# Patient Record
Sex: Male | Born: 1945
Health system: Southern US, Community
[De-identification: ages and names within clinical notes are randomized; demographics above are authoritative.]

## PROBLEM LIST (undated history)

## (undated) DIAGNOSIS — N184 Chronic kidney disease, stage 4 (severe): Secondary | ICD-10-CM

## (undated) DIAGNOSIS — E119 Type 2 diabetes mellitus without complications: Secondary | ICD-10-CM

## (undated) DIAGNOSIS — M1990 Unspecified osteoarthritis, unspecified site: Secondary | ICD-10-CM

## (undated) DIAGNOSIS — M109 Gout, unspecified: Secondary | ICD-10-CM

## (undated) DIAGNOSIS — I10 Essential (primary) hypertension: Secondary | ICD-10-CM

## (undated) HISTORY — PX: KNEE SURGERY: SHX244

## (undated) HISTORY — DX: Chronic kidney disease, stage 4 (severe): N18.4

## (undated) HISTORY — DX: Gout, unspecified: M10.9

## (undated) HISTORY — DX: Unspecified osteoarthritis, unspecified site: M19.90

## (undated) HISTORY — DX: Type 2 diabetes mellitus without complications: E11.9

## (undated) HISTORY — PX: TONSILLECTOMY: SHX5217

## (undated) HISTORY — DX: Essential (primary) hypertension: I10

---

## 1997-08-30 ENCOUNTER — Ambulatory Visit (HOSPITAL_COMMUNITY): Admission: RE | Admit: 1997-08-30 | Discharge: 1997-08-30 | Payer: Self-pay | Admitting: Family Medicine

## 2014-06-19 DIAGNOSIS — E782 Mixed hyperlipidemia: Secondary | ICD-10-CM | POA: Diagnosis not present

## 2014-06-19 DIAGNOSIS — I10 Essential (primary) hypertension: Secondary | ICD-10-CM | POA: Diagnosis not present

## 2014-06-19 DIAGNOSIS — E1122 Type 2 diabetes mellitus with diabetic chronic kidney disease: Secondary | ICD-10-CM | POA: Diagnosis not present

## 2014-06-19 DIAGNOSIS — E1165 Type 2 diabetes mellitus with hyperglycemia: Secondary | ICD-10-CM | POA: Diagnosis not present

## 2014-06-19 DIAGNOSIS — E6609 Other obesity due to excess calories: Secondary | ICD-10-CM | POA: Diagnosis not present

## 2014-06-26 DIAGNOSIS — E782 Mixed hyperlipidemia: Secondary | ICD-10-CM | POA: Diagnosis not present

## 2014-06-26 DIAGNOSIS — E6609 Other obesity due to excess calories: Secondary | ICD-10-CM | POA: Diagnosis not present

## 2014-06-26 DIAGNOSIS — I10 Essential (primary) hypertension: Secondary | ICD-10-CM | POA: Diagnosis not present

## 2014-06-26 DIAGNOSIS — M1000 Idiopathic gout, unspecified site: Secondary | ICD-10-CM | POA: Diagnosis not present

## 2014-06-26 DIAGNOSIS — E1122 Type 2 diabetes mellitus with diabetic chronic kidney disease: Secondary | ICD-10-CM | POA: Diagnosis not present

## 2014-10-25 DIAGNOSIS — E1122 Type 2 diabetes mellitus with diabetic chronic kidney disease: Secondary | ICD-10-CM | POA: Diagnosis not present

## 2014-10-25 DIAGNOSIS — I10 Essential (primary) hypertension: Secondary | ICD-10-CM | POA: Diagnosis not present

## 2014-10-25 DIAGNOSIS — N183 Chronic kidney disease, stage 3 (moderate): Secondary | ICD-10-CM | POA: Diagnosis not present

## 2014-10-25 DIAGNOSIS — E6609 Other obesity due to excess calories: Secondary | ICD-10-CM | POA: Diagnosis not present

## 2014-10-25 DIAGNOSIS — E782 Mixed hyperlipidemia: Secondary | ICD-10-CM | POA: Diagnosis not present

## 2014-10-31 DIAGNOSIS — E6609 Other obesity due to excess calories: Secondary | ICD-10-CM | POA: Diagnosis not present

## 2014-10-31 DIAGNOSIS — E782 Mixed hyperlipidemia: Secondary | ICD-10-CM | POA: Diagnosis not present

## 2014-10-31 DIAGNOSIS — Z1389 Encounter for screening for other disorder: Secondary | ICD-10-CM | POA: Diagnosis not present

## 2014-10-31 DIAGNOSIS — N183 Chronic kidney disease, stage 3 (moderate): Secondary | ICD-10-CM | POA: Diagnosis not present

## 2014-10-31 DIAGNOSIS — E1122 Type 2 diabetes mellitus with diabetic chronic kidney disease: Secondary | ICD-10-CM | POA: Diagnosis not present

## 2014-10-31 DIAGNOSIS — M1000 Idiopathic gout, unspecified site: Secondary | ICD-10-CM | POA: Diagnosis not present

## 2014-12-13 DIAGNOSIS — Z23 Encounter for immunization: Secondary | ICD-10-CM | POA: Diagnosis not present

## 2015-01-24 DIAGNOSIS — Z23 Encounter for immunization: Secondary | ICD-10-CM | POA: Diagnosis not present

## 2015-02-25 DIAGNOSIS — E1165 Type 2 diabetes mellitus with hyperglycemia: Secondary | ICD-10-CM | POA: Diagnosis not present

## 2015-02-25 DIAGNOSIS — M1000 Idiopathic gout, unspecified site: Secondary | ICD-10-CM | POA: Diagnosis not present

## 2015-02-25 DIAGNOSIS — I10 Essential (primary) hypertension: Secondary | ICD-10-CM | POA: Diagnosis not present

## 2015-02-25 DIAGNOSIS — E782 Mixed hyperlipidemia: Secondary | ICD-10-CM | POA: Diagnosis not present

## 2015-03-04 DIAGNOSIS — Z0001 Encounter for general adult medical examination with abnormal findings: Secondary | ICD-10-CM | POA: Diagnosis not present

## 2015-03-04 DIAGNOSIS — N183 Chronic kidney disease, stage 3 (moderate): Secondary | ICD-10-CM | POA: Diagnosis not present

## 2015-03-04 DIAGNOSIS — E6609 Other obesity due to excess calories: Secondary | ICD-10-CM | POA: Diagnosis not present

## 2015-03-04 DIAGNOSIS — E782 Mixed hyperlipidemia: Secondary | ICD-10-CM | POA: Diagnosis not present

## 2015-03-04 DIAGNOSIS — E1122 Type 2 diabetes mellitus with diabetic chronic kidney disease: Secondary | ICD-10-CM | POA: Diagnosis not present

## 2015-04-16 DIAGNOSIS — Z23 Encounter for immunization: Secondary | ICD-10-CM | POA: Diagnosis not present

## 2015-06-28 DIAGNOSIS — E1122 Type 2 diabetes mellitus with diabetic chronic kidney disease: Secondary | ICD-10-CM | POA: Diagnosis not present

## 2015-06-28 DIAGNOSIS — I10 Essential (primary) hypertension: Secondary | ICD-10-CM | POA: Diagnosis not present

## 2015-06-28 DIAGNOSIS — E782 Mixed hyperlipidemia: Secondary | ICD-10-CM | POA: Diagnosis not present

## 2015-06-28 DIAGNOSIS — M1000 Idiopathic gout, unspecified site: Secondary | ICD-10-CM | POA: Diagnosis not present

## 2015-07-03 DIAGNOSIS — E1122 Type 2 diabetes mellitus with diabetic chronic kidney disease: Secondary | ICD-10-CM | POA: Diagnosis not present

## 2015-07-03 DIAGNOSIS — I10 Essential (primary) hypertension: Secondary | ICD-10-CM | POA: Diagnosis not present

## 2015-07-03 DIAGNOSIS — E782 Mixed hyperlipidemia: Secondary | ICD-10-CM | POA: Diagnosis not present

## 2015-07-03 DIAGNOSIS — N183 Chronic kidney disease, stage 3 (moderate): Secondary | ICD-10-CM | POA: Diagnosis not present

## 2015-11-06 DIAGNOSIS — I10 Essential (primary) hypertension: Secondary | ICD-10-CM | POA: Diagnosis not present

## 2015-11-06 DIAGNOSIS — E782 Mixed hyperlipidemia: Secondary | ICD-10-CM | POA: Diagnosis not present

## 2015-11-06 DIAGNOSIS — E1122 Type 2 diabetes mellitus with diabetic chronic kidney disease: Secondary | ICD-10-CM | POA: Diagnosis not present

## 2015-11-06 DIAGNOSIS — N183 Chronic kidney disease, stage 3 (moderate): Secondary | ICD-10-CM | POA: Diagnosis not present

## 2015-11-11 DIAGNOSIS — E1122 Type 2 diabetes mellitus with diabetic chronic kidney disease: Secondary | ICD-10-CM | POA: Diagnosis not present

## 2015-11-11 DIAGNOSIS — N183 Chronic kidney disease, stage 3 (moderate): Secondary | ICD-10-CM | POA: Diagnosis not present

## 2015-11-11 DIAGNOSIS — E782 Mixed hyperlipidemia: Secondary | ICD-10-CM | POA: Diagnosis not present

## 2015-11-11 DIAGNOSIS — I10 Essential (primary) hypertension: Secondary | ICD-10-CM | POA: Diagnosis not present

## 2016-01-31 DIAGNOSIS — Z23 Encounter for immunization: Secondary | ICD-10-CM | POA: Diagnosis not present

## 2016-03-04 DIAGNOSIS — Z9189 Other specified personal risk factors, not elsewhere classified: Secondary | ICD-10-CM | POA: Diagnosis not present

## 2016-03-04 DIAGNOSIS — E1122 Type 2 diabetes mellitus with diabetic chronic kidney disease: Secondary | ICD-10-CM | POA: Diagnosis not present

## 2016-03-04 DIAGNOSIS — N183 Chronic kidney disease, stage 3 (moderate): Secondary | ICD-10-CM | POA: Diagnosis not present

## 2016-03-04 DIAGNOSIS — E782 Mixed hyperlipidemia: Secondary | ICD-10-CM | POA: Diagnosis not present

## 2016-03-04 DIAGNOSIS — I10 Essential (primary) hypertension: Secondary | ICD-10-CM | POA: Diagnosis not present

## 2016-03-09 DIAGNOSIS — Z1212 Encounter for screening for malignant neoplasm of rectum: Secondary | ICD-10-CM | POA: Diagnosis not present

## 2016-03-09 DIAGNOSIS — Z0001 Encounter for general adult medical examination with abnormal findings: Secondary | ICD-10-CM | POA: Diagnosis not present

## 2016-03-09 DIAGNOSIS — I10 Essential (primary) hypertension: Secondary | ICD-10-CM | POA: Diagnosis not present

## 2016-03-09 DIAGNOSIS — E782 Mixed hyperlipidemia: Secondary | ICD-10-CM | POA: Diagnosis not present

## 2016-03-09 DIAGNOSIS — E1122 Type 2 diabetes mellitus with diabetic chronic kidney disease: Secondary | ICD-10-CM | POA: Diagnosis not present

## 2016-05-14 DIAGNOSIS — Q438 Other specified congenital malformations of intestine: Secondary | ICD-10-CM | POA: Diagnosis not present

## 2016-05-14 DIAGNOSIS — Z8489 Family history of other specified conditions: Secondary | ICD-10-CM | POA: Diagnosis not present

## 2016-05-14 DIAGNOSIS — E785 Hyperlipidemia, unspecified: Secondary | ICD-10-CM | POA: Diagnosis not present

## 2016-05-14 DIAGNOSIS — Z7982 Long term (current) use of aspirin: Secondary | ICD-10-CM | POA: Diagnosis not present

## 2016-05-14 DIAGNOSIS — Z1211 Encounter for screening for malignant neoplasm of colon: Secondary | ICD-10-CM | POA: Diagnosis not present

## 2016-05-14 DIAGNOSIS — Z7984 Long term (current) use of oral hypoglycemic drugs: Secondary | ICD-10-CM | POA: Diagnosis not present

## 2016-05-14 DIAGNOSIS — M1990 Unspecified osteoarthritis, unspecified site: Secondary | ICD-10-CM | POA: Diagnosis not present

## 2016-05-14 DIAGNOSIS — Z800 Family history of malignant neoplasm of digestive organs: Secondary | ICD-10-CM | POA: Diagnosis not present

## 2016-05-14 DIAGNOSIS — Z79899 Other long term (current) drug therapy: Secondary | ICD-10-CM | POA: Diagnosis not present

## 2016-05-14 DIAGNOSIS — I10 Essential (primary) hypertension: Secondary | ICD-10-CM | POA: Diagnosis not present

## 2016-05-14 DIAGNOSIS — E119 Type 2 diabetes mellitus without complications: Secondary | ICD-10-CM | POA: Diagnosis not present

## 2016-05-14 DIAGNOSIS — M109 Gout, unspecified: Secondary | ICD-10-CM | POA: Diagnosis not present

## 2016-07-02 DIAGNOSIS — E782 Mixed hyperlipidemia: Secondary | ICD-10-CM | POA: Diagnosis not present

## 2016-07-02 DIAGNOSIS — E1165 Type 2 diabetes mellitus with hyperglycemia: Secondary | ICD-10-CM | POA: Diagnosis not present

## 2016-07-02 DIAGNOSIS — I10 Essential (primary) hypertension: Secondary | ICD-10-CM | POA: Diagnosis not present

## 2016-07-02 DIAGNOSIS — E1122 Type 2 diabetes mellitus with diabetic chronic kidney disease: Secondary | ICD-10-CM | POA: Diagnosis not present

## 2016-07-02 DIAGNOSIS — N183 Chronic kidney disease, stage 3 (moderate): Secondary | ICD-10-CM | POA: Diagnosis not present

## 2016-07-06 DIAGNOSIS — E1122 Type 2 diabetes mellitus with diabetic chronic kidney disease: Secondary | ICD-10-CM | POA: Diagnosis not present

## 2016-07-06 DIAGNOSIS — Z23 Encounter for immunization: Secondary | ICD-10-CM | POA: Diagnosis not present

## 2016-07-06 DIAGNOSIS — E782 Mixed hyperlipidemia: Secondary | ICD-10-CM | POA: Diagnosis not present

## 2016-07-06 DIAGNOSIS — N183 Chronic kidney disease, stage 3 (moderate): Secondary | ICD-10-CM | POA: Diagnosis not present

## 2016-07-06 DIAGNOSIS — I10 Essential (primary) hypertension: Secondary | ICD-10-CM | POA: Diagnosis not present

## 2016-10-05 DIAGNOSIS — Z23 Encounter for immunization: Secondary | ICD-10-CM | POA: Diagnosis not present

## 2016-10-30 DIAGNOSIS — E1165 Type 2 diabetes mellitus with hyperglycemia: Secondary | ICD-10-CM | POA: Diagnosis not present

## 2016-10-30 DIAGNOSIS — I10 Essential (primary) hypertension: Secondary | ICD-10-CM | POA: Diagnosis not present

## 2016-10-30 DIAGNOSIS — N183 Chronic kidney disease, stage 3 (moderate): Secondary | ICD-10-CM | POA: Diagnosis not present

## 2016-10-30 DIAGNOSIS — E1122 Type 2 diabetes mellitus with diabetic chronic kidney disease: Secondary | ICD-10-CM | POA: Diagnosis not present

## 2016-10-30 DIAGNOSIS — E782 Mixed hyperlipidemia: Secondary | ICD-10-CM | POA: Diagnosis not present

## 2016-11-03 DIAGNOSIS — N183 Chronic kidney disease, stage 3 (moderate): Secondary | ICD-10-CM | POA: Diagnosis not present

## 2016-11-03 DIAGNOSIS — E1122 Type 2 diabetes mellitus with diabetic chronic kidney disease: Secondary | ICD-10-CM | POA: Diagnosis not present

## 2016-11-03 DIAGNOSIS — E782 Mixed hyperlipidemia: Secondary | ICD-10-CM | POA: Diagnosis not present

## 2016-11-03 DIAGNOSIS — I10 Essential (primary) hypertension: Secondary | ICD-10-CM | POA: Diagnosis not present

## 2016-12-17 DIAGNOSIS — Z23 Encounter for immunization: Secondary | ICD-10-CM | POA: Diagnosis not present

## 2017-02-25 DIAGNOSIS — H524 Presbyopia: Secondary | ICD-10-CM | POA: Diagnosis not present

## 2017-02-25 DIAGNOSIS — E11319 Type 2 diabetes mellitus with unspecified diabetic retinopathy without macular edema: Secondary | ICD-10-CM | POA: Diagnosis not present

## 2017-03-05 DIAGNOSIS — I10 Essential (primary) hypertension: Secondary | ICD-10-CM | POA: Diagnosis not present

## 2017-03-05 DIAGNOSIS — D519 Vitamin B12 deficiency anemia, unspecified: Secondary | ICD-10-CM | POA: Diagnosis not present

## 2017-03-05 DIAGNOSIS — Z0001 Encounter for general adult medical examination with abnormal findings: Secondary | ICD-10-CM | POA: Diagnosis not present

## 2017-03-05 DIAGNOSIS — D509 Iron deficiency anemia, unspecified: Secondary | ICD-10-CM | POA: Diagnosis not present

## 2017-03-05 DIAGNOSIS — N183 Chronic kidney disease, stage 3 (moderate): Secondary | ICD-10-CM | POA: Diagnosis not present

## 2017-03-05 DIAGNOSIS — E782 Mixed hyperlipidemia: Secondary | ICD-10-CM | POA: Diagnosis not present

## 2017-03-05 DIAGNOSIS — E1165 Type 2 diabetes mellitus with hyperglycemia: Secondary | ICD-10-CM | POA: Diagnosis not present

## 2017-03-05 DIAGNOSIS — E1122 Type 2 diabetes mellitus with diabetic chronic kidney disease: Secondary | ICD-10-CM | POA: Diagnosis not present

## 2017-03-11 DIAGNOSIS — Z0001 Encounter for general adult medical examination with abnormal findings: Secondary | ICD-10-CM | POA: Diagnosis not present

## 2017-03-11 DIAGNOSIS — E782 Mixed hyperlipidemia: Secondary | ICD-10-CM | POA: Diagnosis not present

## 2017-03-11 DIAGNOSIS — N183 Chronic kidney disease, stage 3 (moderate): Secondary | ICD-10-CM | POA: Diagnosis not present

## 2017-03-11 DIAGNOSIS — E1122 Type 2 diabetes mellitus with diabetic chronic kidney disease: Secondary | ICD-10-CM | POA: Diagnosis not present

## 2017-03-11 DIAGNOSIS — I10 Essential (primary) hypertension: Secondary | ICD-10-CM | POA: Diagnosis not present

## 2017-07-05 DIAGNOSIS — E1122 Type 2 diabetes mellitus with diabetic chronic kidney disease: Secondary | ICD-10-CM | POA: Diagnosis not present

## 2017-07-05 DIAGNOSIS — N183 Chronic kidney disease, stage 3 (moderate): Secondary | ICD-10-CM | POA: Diagnosis not present

## 2017-07-05 DIAGNOSIS — I10 Essential (primary) hypertension: Secondary | ICD-10-CM | POA: Diagnosis not present

## 2017-07-05 DIAGNOSIS — E782 Mixed hyperlipidemia: Secondary | ICD-10-CM | POA: Diagnosis not present

## 2017-07-13 DIAGNOSIS — E119 Type 2 diabetes mellitus without complications: Secondary | ICD-10-CM | POA: Diagnosis not present

## 2017-07-13 DIAGNOSIS — I10 Essential (primary) hypertension: Secondary | ICD-10-CM | POA: Diagnosis not present

## 2017-07-13 DIAGNOSIS — E785 Hyperlipidemia, unspecified: Secondary | ICD-10-CM | POA: Diagnosis not present

## 2017-07-14 DIAGNOSIS — E785 Hyperlipidemia, unspecified: Secondary | ICD-10-CM | POA: Diagnosis not present

## 2017-07-14 DIAGNOSIS — E119 Type 2 diabetes mellitus without complications: Secondary | ICD-10-CM | POA: Diagnosis not present

## 2017-07-14 DIAGNOSIS — I10 Essential (primary) hypertension: Secondary | ICD-10-CM | POA: Diagnosis not present

## 2017-09-21 DIAGNOSIS — M1000 Idiopathic gout, unspecified site: Secondary | ICD-10-CM | POA: Diagnosis not present

## 2017-09-21 DIAGNOSIS — E1165 Type 2 diabetes mellitus with hyperglycemia: Secondary | ICD-10-CM | POA: Diagnosis not present

## 2017-09-21 DIAGNOSIS — I10 Essential (primary) hypertension: Secondary | ICD-10-CM | POA: Diagnosis not present

## 2017-09-21 DIAGNOSIS — D519 Vitamin B12 deficiency anemia, unspecified: Secondary | ICD-10-CM | POA: Diagnosis not present

## 2017-09-21 DIAGNOSIS — E1122 Type 2 diabetes mellitus with diabetic chronic kidney disease: Secondary | ICD-10-CM | POA: Diagnosis not present

## 2017-09-27 DIAGNOSIS — N183 Chronic kidney disease, stage 3 (moderate): Secondary | ICD-10-CM | POA: Diagnosis not present

## 2017-09-27 DIAGNOSIS — I10 Essential (primary) hypertension: Secondary | ICD-10-CM | POA: Diagnosis not present

## 2017-09-27 DIAGNOSIS — E782 Mixed hyperlipidemia: Secondary | ICD-10-CM | POA: Diagnosis not present

## 2017-09-27 DIAGNOSIS — E1122 Type 2 diabetes mellitus with diabetic chronic kidney disease: Secondary | ICD-10-CM | POA: Diagnosis not present

## 2017-09-27 DIAGNOSIS — E538 Deficiency of other specified B group vitamins: Secondary | ICD-10-CM | POA: Diagnosis not present

## 2018-01-27 DIAGNOSIS — Z23 Encounter for immunization: Secondary | ICD-10-CM | POA: Diagnosis not present

## 2018-03-10 DIAGNOSIS — D519 Vitamin B12 deficiency anemia, unspecified: Secondary | ICD-10-CM | POA: Diagnosis not present

## 2018-03-10 DIAGNOSIS — E1165 Type 2 diabetes mellitus with hyperglycemia: Secondary | ICD-10-CM | POA: Diagnosis not present

## 2018-03-10 DIAGNOSIS — E782 Mixed hyperlipidemia: Secondary | ICD-10-CM | POA: Diagnosis not present

## 2018-03-10 DIAGNOSIS — I10 Essential (primary) hypertension: Secondary | ICD-10-CM | POA: Diagnosis not present

## 2018-03-10 DIAGNOSIS — E1122 Type 2 diabetes mellitus with diabetic chronic kidney disease: Secondary | ICD-10-CM | POA: Diagnosis not present

## 2018-03-10 DIAGNOSIS — Z9189 Other specified personal risk factors, not elsewhere classified: Secondary | ICD-10-CM | POA: Diagnosis not present

## 2018-03-14 DIAGNOSIS — Z1212 Encounter for screening for malignant neoplasm of rectum: Secondary | ICD-10-CM | POA: Diagnosis not present

## 2018-03-14 DIAGNOSIS — Z0001 Encounter for general adult medical examination with abnormal findings: Secondary | ICD-10-CM | POA: Diagnosis not present

## 2018-03-14 DIAGNOSIS — I10 Essential (primary) hypertension: Secondary | ICD-10-CM | POA: Diagnosis not present

## 2018-04-21 DIAGNOSIS — I10 Essential (primary) hypertension: Secondary | ICD-10-CM | POA: Diagnosis not present

## 2018-04-21 DIAGNOSIS — E782 Mixed hyperlipidemia: Secondary | ICD-10-CM | POA: Diagnosis not present

## 2018-07-08 DIAGNOSIS — N183 Chronic kidney disease, stage 3 (moderate): Secondary | ICD-10-CM | POA: Diagnosis not present

## 2018-07-08 DIAGNOSIS — Z9189 Other specified personal risk factors, not elsewhere classified: Secondary | ICD-10-CM | POA: Diagnosis not present

## 2018-07-08 DIAGNOSIS — D649 Anemia, unspecified: Secondary | ICD-10-CM | POA: Diagnosis not present

## 2018-07-08 DIAGNOSIS — I10 Essential (primary) hypertension: Secondary | ICD-10-CM | POA: Diagnosis not present

## 2018-07-08 DIAGNOSIS — E782 Mixed hyperlipidemia: Secondary | ICD-10-CM | POA: Diagnosis not present

## 2018-07-08 DIAGNOSIS — E039 Hypothyroidism, unspecified: Secondary | ICD-10-CM | POA: Diagnosis not present

## 2018-07-08 DIAGNOSIS — E1122 Type 2 diabetes mellitus with diabetic chronic kidney disease: Secondary | ICD-10-CM | POA: Diagnosis not present

## 2018-07-12 DIAGNOSIS — I10 Essential (primary) hypertension: Secondary | ICD-10-CM | POA: Diagnosis not present

## 2018-07-12 DIAGNOSIS — E782 Mixed hyperlipidemia: Secondary | ICD-10-CM | POA: Diagnosis not present

## 2018-07-12 DIAGNOSIS — E1122 Type 2 diabetes mellitus with diabetic chronic kidney disease: Secondary | ICD-10-CM | POA: Diagnosis not present

## 2018-07-12 DIAGNOSIS — E538 Deficiency of other specified B group vitamins: Secondary | ICD-10-CM | POA: Diagnosis not present

## 2018-07-12 DIAGNOSIS — N183 Chronic kidney disease, stage 3 (moderate): Secondary | ICD-10-CM | POA: Diagnosis not present

## 2018-10-28 DIAGNOSIS — E782 Mixed hyperlipidemia: Secondary | ICD-10-CM | POA: Diagnosis not present

## 2018-10-28 DIAGNOSIS — E1165 Type 2 diabetes mellitus with hyperglycemia: Secondary | ICD-10-CM | POA: Diagnosis not present

## 2018-10-28 DIAGNOSIS — I10 Essential (primary) hypertension: Secondary | ICD-10-CM | POA: Diagnosis not present

## 2018-10-28 DIAGNOSIS — E1122 Type 2 diabetes mellitus with diabetic chronic kidney disease: Secondary | ICD-10-CM | POA: Diagnosis not present

## 2018-11-02 DIAGNOSIS — I10 Essential (primary) hypertension: Secondary | ICD-10-CM | POA: Diagnosis not present

## 2018-11-02 DIAGNOSIS — E1122 Type 2 diabetes mellitus with diabetic chronic kidney disease: Secondary | ICD-10-CM | POA: Diagnosis not present

## 2018-11-02 DIAGNOSIS — E538 Deficiency of other specified B group vitamins: Secondary | ICD-10-CM | POA: Diagnosis not present

## 2018-11-02 DIAGNOSIS — N183 Chronic kidney disease, stage 3 (moderate): Secondary | ICD-10-CM | POA: Diagnosis not present

## 2018-11-02 DIAGNOSIS — E782 Mixed hyperlipidemia: Secondary | ICD-10-CM | POA: Diagnosis not present

## 2018-11-21 DIAGNOSIS — E782 Mixed hyperlipidemia: Secondary | ICD-10-CM | POA: Diagnosis not present

## 2018-11-21 DIAGNOSIS — I10 Essential (primary) hypertension: Secondary | ICD-10-CM | POA: Diagnosis not present

## 2019-01-18 DIAGNOSIS — Z23 Encounter for immunization: Secondary | ICD-10-CM | POA: Diagnosis not present

## 2019-03-10 DIAGNOSIS — E1165 Type 2 diabetes mellitus with hyperglycemia: Secondary | ICD-10-CM | POA: Diagnosis not present

## 2019-03-10 DIAGNOSIS — E782 Mixed hyperlipidemia: Secondary | ICD-10-CM | POA: Diagnosis not present

## 2019-03-10 DIAGNOSIS — I10 Essential (primary) hypertension: Secondary | ICD-10-CM | POA: Diagnosis not present

## 2019-03-10 DIAGNOSIS — D649 Anemia, unspecified: Secondary | ICD-10-CM | POA: Diagnosis not present

## 2019-03-10 DIAGNOSIS — E1122 Type 2 diabetes mellitus with diabetic chronic kidney disease: Secondary | ICD-10-CM | POA: Diagnosis not present

## 2019-03-15 DIAGNOSIS — Z0001 Encounter for general adult medical examination with abnormal findings: Secondary | ICD-10-CM | POA: Diagnosis not present

## 2019-03-15 DIAGNOSIS — E1122 Type 2 diabetes mellitus with diabetic chronic kidney disease: Secondary | ICD-10-CM | POA: Diagnosis not present

## 2019-03-15 DIAGNOSIS — Z1212 Encounter for screening for malignant neoplasm of rectum: Secondary | ICD-10-CM | POA: Diagnosis not present

## 2019-03-15 DIAGNOSIS — D519 Vitamin B12 deficiency anemia, unspecified: Secondary | ICD-10-CM | POA: Diagnosis not present

## 2019-03-15 DIAGNOSIS — I10 Essential (primary) hypertension: Secondary | ICD-10-CM | POA: Diagnosis not present

## 2019-03-15 DIAGNOSIS — E538 Deficiency of other specified B group vitamins: Secondary | ICD-10-CM | POA: Diagnosis not present

## 2019-04-21 DIAGNOSIS — E1122 Type 2 diabetes mellitus with diabetic chronic kidney disease: Secondary | ICD-10-CM | POA: Diagnosis not present

## 2019-04-21 DIAGNOSIS — E1165 Type 2 diabetes mellitus with hyperglycemia: Secondary | ICD-10-CM | POA: Diagnosis not present

## 2019-04-21 DIAGNOSIS — E7849 Other hyperlipidemia: Secondary | ICD-10-CM | POA: Diagnosis not present

## 2019-06-20 DIAGNOSIS — E7849 Other hyperlipidemia: Secondary | ICD-10-CM | POA: Diagnosis not present

## 2019-06-20 DIAGNOSIS — I10 Essential (primary) hypertension: Secondary | ICD-10-CM | POA: Diagnosis not present

## 2019-06-21 DIAGNOSIS — E7849 Other hyperlipidemia: Secondary | ICD-10-CM | POA: Diagnosis not present

## 2019-06-21 DIAGNOSIS — I10 Essential (primary) hypertension: Secondary | ICD-10-CM | POA: Diagnosis not present

## 2019-07-07 DIAGNOSIS — Z9189 Other specified personal risk factors, not elsewhere classified: Secondary | ICD-10-CM | POA: Diagnosis not present

## 2019-07-07 DIAGNOSIS — E782 Mixed hyperlipidemia: Secondary | ICD-10-CM | POA: Diagnosis not present

## 2019-07-07 DIAGNOSIS — I10 Essential (primary) hypertension: Secondary | ICD-10-CM | POA: Diagnosis not present

## 2019-07-07 DIAGNOSIS — R5382 Chronic fatigue, unspecified: Secondary | ICD-10-CM | POA: Diagnosis not present

## 2019-07-07 DIAGNOSIS — N183 Chronic kidney disease, stage 3 unspecified: Secondary | ICD-10-CM | POA: Diagnosis not present

## 2019-07-07 DIAGNOSIS — E1122 Type 2 diabetes mellitus with diabetic chronic kidney disease: Secondary | ICD-10-CM | POA: Diagnosis not present

## 2019-07-12 DIAGNOSIS — I10 Essential (primary) hypertension: Secondary | ICD-10-CM | POA: Diagnosis not present

## 2019-07-12 DIAGNOSIS — D519 Vitamin B12 deficiency anemia, unspecified: Secondary | ICD-10-CM | POA: Diagnosis not present

## 2019-07-12 DIAGNOSIS — E538 Deficiency of other specified B group vitamins: Secondary | ICD-10-CM | POA: Diagnosis not present

## 2019-07-12 DIAGNOSIS — E1122 Type 2 diabetes mellitus with diabetic chronic kidney disease: Secondary | ICD-10-CM | POA: Diagnosis not present

## 2019-07-12 DIAGNOSIS — E782 Mixed hyperlipidemia: Secondary | ICD-10-CM | POA: Diagnosis not present

## 2019-07-20 DIAGNOSIS — E1122 Type 2 diabetes mellitus with diabetic chronic kidney disease: Secondary | ICD-10-CM | POA: Diagnosis not present

## 2019-07-20 DIAGNOSIS — I10 Essential (primary) hypertension: Secondary | ICD-10-CM | POA: Diagnosis not present

## 2019-07-20 DIAGNOSIS — E1165 Type 2 diabetes mellitus with hyperglycemia: Secondary | ICD-10-CM | POA: Diagnosis not present

## 2019-07-21 DIAGNOSIS — E1165 Type 2 diabetes mellitus with hyperglycemia: Secondary | ICD-10-CM | POA: Diagnosis not present

## 2019-07-21 DIAGNOSIS — E1122 Type 2 diabetes mellitus with diabetic chronic kidney disease: Secondary | ICD-10-CM | POA: Diagnosis not present

## 2019-07-21 DIAGNOSIS — I10 Essential (primary) hypertension: Secondary | ICD-10-CM | POA: Diagnosis not present

## 2019-08-21 DIAGNOSIS — I129 Hypertensive chronic kidney disease with stage 1 through stage 4 chronic kidney disease, or unspecified chronic kidney disease: Secondary | ICD-10-CM | POA: Diagnosis not present

## 2019-08-21 DIAGNOSIS — E1122 Type 2 diabetes mellitus with diabetic chronic kidney disease: Secondary | ICD-10-CM | POA: Diagnosis not present

## 2019-08-21 DIAGNOSIS — N183 Chronic kidney disease, stage 3 unspecified: Secondary | ICD-10-CM | POA: Diagnosis not present

## 2019-08-21 DIAGNOSIS — E7849 Other hyperlipidemia: Secondary | ICD-10-CM | POA: Diagnosis not present

## 2019-09-20 DIAGNOSIS — N183 Chronic kidney disease, stage 3 unspecified: Secondary | ICD-10-CM | POA: Diagnosis not present

## 2019-09-20 DIAGNOSIS — E7849 Other hyperlipidemia: Secondary | ICD-10-CM | POA: Diagnosis not present

## 2019-09-20 DIAGNOSIS — I129 Hypertensive chronic kidney disease with stage 1 through stage 4 chronic kidney disease, or unspecified chronic kidney disease: Secondary | ICD-10-CM | POA: Diagnosis not present

## 2019-09-20 DIAGNOSIS — E1122 Type 2 diabetes mellitus with diabetic chronic kidney disease: Secondary | ICD-10-CM | POA: Diagnosis not present

## 2019-10-20 DIAGNOSIS — I129 Hypertensive chronic kidney disease with stage 1 through stage 4 chronic kidney disease, or unspecified chronic kidney disease: Secondary | ICD-10-CM | POA: Diagnosis not present

## 2019-10-20 DIAGNOSIS — N183 Chronic kidney disease, stage 3 unspecified: Secondary | ICD-10-CM | POA: Diagnosis not present

## 2019-10-20 DIAGNOSIS — E1122 Type 2 diabetes mellitus with diabetic chronic kidney disease: Secondary | ICD-10-CM | POA: Diagnosis not present

## 2019-10-20 DIAGNOSIS — E7849 Other hyperlipidemia: Secondary | ICD-10-CM | POA: Diagnosis not present

## 2019-11-10 DIAGNOSIS — I10 Essential (primary) hypertension: Secondary | ICD-10-CM | POA: Diagnosis not present

## 2019-11-10 DIAGNOSIS — E782 Mixed hyperlipidemia: Secondary | ICD-10-CM | POA: Diagnosis not present

## 2019-11-10 DIAGNOSIS — Z9189 Other specified personal risk factors, not elsewhere classified: Secondary | ICD-10-CM | POA: Diagnosis not present

## 2019-11-10 DIAGNOSIS — E1122 Type 2 diabetes mellitus with diabetic chronic kidney disease: Secondary | ICD-10-CM | POA: Diagnosis not present

## 2019-11-10 DIAGNOSIS — E1165 Type 2 diabetes mellitus with hyperglycemia: Secondary | ICD-10-CM | POA: Diagnosis not present

## 2019-11-10 DIAGNOSIS — N183 Chronic kidney disease, stage 3 unspecified: Secondary | ICD-10-CM | POA: Diagnosis not present

## 2019-11-13 DIAGNOSIS — E538 Deficiency of other specified B group vitamins: Secondary | ICD-10-CM | POA: Diagnosis not present

## 2019-11-13 DIAGNOSIS — I10 Essential (primary) hypertension: Secondary | ICD-10-CM | POA: Diagnosis not present

## 2019-11-13 DIAGNOSIS — D519 Vitamin B12 deficiency anemia, unspecified: Secondary | ICD-10-CM | POA: Diagnosis not present

## 2019-11-13 DIAGNOSIS — E782 Mixed hyperlipidemia: Secondary | ICD-10-CM | POA: Diagnosis not present

## 2019-11-13 DIAGNOSIS — E1122 Type 2 diabetes mellitus with diabetic chronic kidney disease: Secondary | ICD-10-CM | POA: Diagnosis not present

## 2019-12-19 DIAGNOSIS — D649 Anemia, unspecified: Secondary | ICD-10-CM | POA: Diagnosis not present

## 2019-12-19 DIAGNOSIS — I10 Essential (primary) hypertension: Secondary | ICD-10-CM | POA: Diagnosis not present

## 2019-12-19 DIAGNOSIS — Z1321 Encounter for screening for nutritional disorder: Secondary | ICD-10-CM | POA: Diagnosis not present

## 2019-12-19 DIAGNOSIS — D529 Folate deficiency anemia, unspecified: Secondary | ICD-10-CM | POA: Diagnosis not present

## 2019-12-19 DIAGNOSIS — E559 Vitamin D deficiency, unspecified: Secondary | ICD-10-CM | POA: Diagnosis not present

## 2019-12-19 DIAGNOSIS — E1122 Type 2 diabetes mellitus with diabetic chronic kidney disease: Secondary | ICD-10-CM | POA: Diagnosis not present

## 2019-12-21 DIAGNOSIS — E1122 Type 2 diabetes mellitus with diabetic chronic kidney disease: Secondary | ICD-10-CM | POA: Diagnosis not present

## 2019-12-21 DIAGNOSIS — I129 Hypertensive chronic kidney disease with stage 1 through stage 4 chronic kidney disease, or unspecified chronic kidney disease: Secondary | ICD-10-CM | POA: Diagnosis not present

## 2019-12-21 DIAGNOSIS — N183 Chronic kidney disease, stage 3 unspecified: Secondary | ICD-10-CM | POA: Diagnosis not present

## 2019-12-21 DIAGNOSIS — E7849 Other hyperlipidemia: Secondary | ICD-10-CM | POA: Diagnosis not present

## 2020-01-17 DIAGNOSIS — Z23 Encounter for immunization: Secondary | ICD-10-CM | POA: Diagnosis not present

## 2020-01-20 DIAGNOSIS — I129 Hypertensive chronic kidney disease with stage 1 through stage 4 chronic kidney disease, or unspecified chronic kidney disease: Secondary | ICD-10-CM | POA: Diagnosis not present

## 2020-01-20 DIAGNOSIS — E7849 Other hyperlipidemia: Secondary | ICD-10-CM | POA: Diagnosis not present

## 2020-01-20 DIAGNOSIS — E1122 Type 2 diabetes mellitus with diabetic chronic kidney disease: Secondary | ICD-10-CM | POA: Diagnosis not present

## 2020-01-20 DIAGNOSIS — N183 Chronic kidney disease, stage 3 unspecified: Secondary | ICD-10-CM | POA: Diagnosis not present

## 2020-01-24 DIAGNOSIS — Z23 Encounter for immunization: Secondary | ICD-10-CM | POA: Diagnosis not present

## 2020-02-20 DIAGNOSIS — I129 Hypertensive chronic kidney disease with stage 1 through stage 4 chronic kidney disease, or unspecified chronic kidney disease: Secondary | ICD-10-CM | POA: Diagnosis not present

## 2020-02-20 DIAGNOSIS — N183 Chronic kidney disease, stage 3 unspecified: Secondary | ICD-10-CM | POA: Diagnosis not present

## 2020-02-20 DIAGNOSIS — E1122 Type 2 diabetes mellitus with diabetic chronic kidney disease: Secondary | ICD-10-CM | POA: Diagnosis not present

## 2020-03-08 DIAGNOSIS — D529 Folate deficiency anemia, unspecified: Secondary | ICD-10-CM | POA: Diagnosis not present

## 2020-03-08 DIAGNOSIS — Z1329 Encounter for screening for other suspected endocrine disorder: Secondary | ICD-10-CM | POA: Diagnosis not present

## 2020-03-08 DIAGNOSIS — D519 Vitamin B12 deficiency anemia, unspecified: Secondary | ICD-10-CM | POA: Diagnosis not present

## 2020-03-08 DIAGNOSIS — E1165 Type 2 diabetes mellitus with hyperglycemia: Secondary | ICD-10-CM | POA: Diagnosis not present

## 2020-03-08 DIAGNOSIS — D649 Anemia, unspecified: Secondary | ICD-10-CM | POA: Diagnosis not present

## 2020-03-08 DIAGNOSIS — E1122 Type 2 diabetes mellitus with diabetic chronic kidney disease: Secondary | ICD-10-CM | POA: Diagnosis not present

## 2020-03-11 DIAGNOSIS — D519 Vitamin B12 deficiency anemia, unspecified: Secondary | ICD-10-CM | POA: Diagnosis not present

## 2020-03-11 DIAGNOSIS — I10 Essential (primary) hypertension: Secondary | ICD-10-CM | POA: Diagnosis not present

## 2020-03-11 DIAGNOSIS — Z0001 Encounter for general adult medical examination with abnormal findings: Secondary | ICD-10-CM | POA: Diagnosis not present

## 2020-03-11 DIAGNOSIS — E782 Mixed hyperlipidemia: Secondary | ICD-10-CM | POA: Diagnosis not present

## 2020-03-11 DIAGNOSIS — E1122 Type 2 diabetes mellitus with diabetic chronic kidney disease: Secondary | ICD-10-CM | POA: Diagnosis not present

## 2020-03-22 DIAGNOSIS — E1122 Type 2 diabetes mellitus with diabetic chronic kidney disease: Secondary | ICD-10-CM | POA: Diagnosis not present

## 2020-03-22 DIAGNOSIS — N183 Chronic kidney disease, stage 3 unspecified: Secondary | ICD-10-CM | POA: Diagnosis not present

## 2020-03-22 DIAGNOSIS — E7849 Other hyperlipidemia: Secondary | ICD-10-CM | POA: Diagnosis not present

## 2020-03-22 DIAGNOSIS — I129 Hypertensive chronic kidney disease with stage 1 through stage 4 chronic kidney disease, or unspecified chronic kidney disease: Secondary | ICD-10-CM | POA: Diagnosis not present

## 2020-04-20 DIAGNOSIS — N183 Chronic kidney disease, stage 3 unspecified: Secondary | ICD-10-CM | POA: Diagnosis not present

## 2020-04-20 DIAGNOSIS — I129 Hypertensive chronic kidney disease with stage 1 through stage 4 chronic kidney disease, or unspecified chronic kidney disease: Secondary | ICD-10-CM | POA: Diagnosis not present

## 2020-04-20 DIAGNOSIS — E1122 Type 2 diabetes mellitus with diabetic chronic kidney disease: Secondary | ICD-10-CM | POA: Diagnosis not present

## 2020-04-20 DIAGNOSIS — E7849 Other hyperlipidemia: Secondary | ICD-10-CM | POA: Diagnosis not present

## 2020-05-20 DIAGNOSIS — I129 Hypertensive chronic kidney disease with stage 1 through stage 4 chronic kidney disease, or unspecified chronic kidney disease: Secondary | ICD-10-CM | POA: Diagnosis not present

## 2020-05-20 DIAGNOSIS — E7849 Other hyperlipidemia: Secondary | ICD-10-CM | POA: Diagnosis not present

## 2020-05-20 DIAGNOSIS — E1122 Type 2 diabetes mellitus with diabetic chronic kidney disease: Secondary | ICD-10-CM | POA: Diagnosis not present

## 2020-05-20 DIAGNOSIS — N183 Chronic kidney disease, stage 3 unspecified: Secondary | ICD-10-CM | POA: Diagnosis not present

## 2020-06-19 DIAGNOSIS — N183 Chronic kidney disease, stage 3 unspecified: Secondary | ICD-10-CM | POA: Diagnosis not present

## 2020-06-19 DIAGNOSIS — E7849 Other hyperlipidemia: Secondary | ICD-10-CM | POA: Diagnosis not present

## 2020-06-19 DIAGNOSIS — E1122 Type 2 diabetes mellitus with diabetic chronic kidney disease: Secondary | ICD-10-CM | POA: Diagnosis not present

## 2020-06-19 DIAGNOSIS — I129 Hypertensive chronic kidney disease with stage 1 through stage 4 chronic kidney disease, or unspecified chronic kidney disease: Secondary | ICD-10-CM | POA: Diagnosis not present

## 2020-06-20 DIAGNOSIS — H35033 Hypertensive retinopathy, bilateral: Secondary | ICD-10-CM | POA: Diagnosis not present

## 2020-06-20 DIAGNOSIS — H524 Presbyopia: Secondary | ICD-10-CM | POA: Diagnosis not present

## 2020-07-11 DIAGNOSIS — E559 Vitamin D deficiency, unspecified: Secondary | ICD-10-CM | POA: Diagnosis not present

## 2020-07-11 DIAGNOSIS — E1122 Type 2 diabetes mellitus with diabetic chronic kidney disease: Secondary | ICD-10-CM | POA: Diagnosis not present

## 2020-07-11 DIAGNOSIS — R809 Proteinuria, unspecified: Secondary | ICD-10-CM | POA: Diagnosis not present

## 2020-07-11 DIAGNOSIS — I10 Essential (primary) hypertension: Secondary | ICD-10-CM | POA: Diagnosis not present

## 2020-07-11 DIAGNOSIS — E782 Mixed hyperlipidemia: Secondary | ICD-10-CM | POA: Diagnosis not present

## 2020-07-11 DIAGNOSIS — E538 Deficiency of other specified B group vitamins: Secondary | ICD-10-CM | POA: Diagnosis not present

## 2020-07-11 DIAGNOSIS — D519 Vitamin B12 deficiency anemia, unspecified: Secondary | ICD-10-CM | POA: Diagnosis not present

## 2020-07-11 DIAGNOSIS — E7849 Other hyperlipidemia: Secondary | ICD-10-CM | POA: Diagnosis not present

## 2020-07-11 DIAGNOSIS — Z23 Encounter for immunization: Secondary | ICD-10-CM | POA: Diagnosis not present

## 2020-07-12 DIAGNOSIS — E7849 Other hyperlipidemia: Secondary | ICD-10-CM | POA: Diagnosis not present

## 2020-07-12 DIAGNOSIS — I10 Essential (primary) hypertension: Secondary | ICD-10-CM | POA: Diagnosis not present

## 2020-07-12 DIAGNOSIS — N183 Chronic kidney disease, stage 3 unspecified: Secondary | ICD-10-CM | POA: Diagnosis not present

## 2020-07-19 DIAGNOSIS — E559 Vitamin D deficiency, unspecified: Secondary | ICD-10-CM | POA: Diagnosis not present

## 2020-07-19 DIAGNOSIS — E782 Mixed hyperlipidemia: Secondary | ICD-10-CM | POA: Diagnosis not present

## 2020-07-19 DIAGNOSIS — I10 Essential (primary) hypertension: Secondary | ICD-10-CM | POA: Diagnosis not present

## 2020-07-19 DIAGNOSIS — N189 Chronic kidney disease, unspecified: Secondary | ICD-10-CM | POA: Diagnosis not present

## 2020-07-19 DIAGNOSIS — N183 Chronic kidney disease, stage 3 unspecified: Secondary | ICD-10-CM | POA: Diagnosis not present

## 2020-07-20 DIAGNOSIS — E1122 Type 2 diabetes mellitus with diabetic chronic kidney disease: Secondary | ICD-10-CM | POA: Diagnosis not present

## 2020-07-20 DIAGNOSIS — N183 Chronic kidney disease, stage 3 unspecified: Secondary | ICD-10-CM | POA: Diagnosis not present

## 2020-07-20 DIAGNOSIS — I129 Hypertensive chronic kidney disease with stage 1 through stage 4 chronic kidney disease, or unspecified chronic kidney disease: Secondary | ICD-10-CM | POA: Diagnosis not present

## 2020-07-20 DIAGNOSIS — E7849 Other hyperlipidemia: Secondary | ICD-10-CM | POA: Diagnosis not present

## 2020-07-29 DIAGNOSIS — N2889 Other specified disorders of kidney and ureter: Secondary | ICD-10-CM | POA: Diagnosis not present

## 2020-07-29 DIAGNOSIS — N189 Chronic kidney disease, unspecified: Secondary | ICD-10-CM | POA: Diagnosis not present

## 2020-08-19 DIAGNOSIS — N183 Chronic kidney disease, stage 3 unspecified: Secondary | ICD-10-CM | POA: Diagnosis not present

## 2020-08-19 DIAGNOSIS — I129 Hypertensive chronic kidney disease with stage 1 through stage 4 chronic kidney disease, or unspecified chronic kidney disease: Secondary | ICD-10-CM | POA: Diagnosis not present

## 2020-08-19 DIAGNOSIS — E7849 Other hyperlipidemia: Secondary | ICD-10-CM | POA: Diagnosis not present

## 2020-08-19 DIAGNOSIS — E1122 Type 2 diabetes mellitus with diabetic chronic kidney disease: Secondary | ICD-10-CM | POA: Diagnosis not present

## 2020-09-19 DIAGNOSIS — E7849 Other hyperlipidemia: Secondary | ICD-10-CM | POA: Diagnosis not present

## 2020-09-19 DIAGNOSIS — N183 Chronic kidney disease, stage 3 unspecified: Secondary | ICD-10-CM | POA: Diagnosis not present

## 2020-09-19 DIAGNOSIS — I129 Hypertensive chronic kidney disease with stage 1 through stage 4 chronic kidney disease, or unspecified chronic kidney disease: Secondary | ICD-10-CM | POA: Diagnosis not present

## 2020-09-19 DIAGNOSIS — E1122 Type 2 diabetes mellitus with diabetic chronic kidney disease: Secondary | ICD-10-CM | POA: Diagnosis not present

## 2020-10-20 DIAGNOSIS — E1122 Type 2 diabetes mellitus with diabetic chronic kidney disease: Secondary | ICD-10-CM | POA: Diagnosis not present

## 2020-10-20 DIAGNOSIS — I129 Hypertensive chronic kidney disease with stage 1 through stage 4 chronic kidney disease, or unspecified chronic kidney disease: Secondary | ICD-10-CM | POA: Diagnosis not present

## 2020-10-20 DIAGNOSIS — N183 Chronic kidney disease, stage 3 unspecified: Secondary | ICD-10-CM | POA: Diagnosis not present

## 2020-10-20 DIAGNOSIS — E7849 Other hyperlipidemia: Secondary | ICD-10-CM | POA: Diagnosis not present

## 2020-10-29 DIAGNOSIS — R059 Cough, unspecified: Secondary | ICD-10-CM | POA: Diagnosis not present

## 2020-11-11 DIAGNOSIS — E782 Mixed hyperlipidemia: Secondary | ICD-10-CM | POA: Diagnosis not present

## 2020-11-11 DIAGNOSIS — E1122 Type 2 diabetes mellitus with diabetic chronic kidney disease: Secondary | ICD-10-CM | POA: Diagnosis not present

## 2020-11-11 DIAGNOSIS — E7849 Other hyperlipidemia: Secondary | ICD-10-CM | POA: Diagnosis not present

## 2020-11-11 DIAGNOSIS — I10 Essential (primary) hypertension: Secondary | ICD-10-CM | POA: Diagnosis not present

## 2020-11-11 DIAGNOSIS — N184 Chronic kidney disease, stage 4 (severe): Secondary | ICD-10-CM | POA: Diagnosis not present

## 2020-11-13 DIAGNOSIS — E7849 Other hyperlipidemia: Secondary | ICD-10-CM | POA: Diagnosis not present

## 2020-11-13 DIAGNOSIS — E1122 Type 2 diabetes mellitus with diabetic chronic kidney disease: Secondary | ICD-10-CM | POA: Diagnosis not present

## 2020-11-13 DIAGNOSIS — D519 Vitamin B12 deficiency anemia, unspecified: Secondary | ICD-10-CM | POA: Diagnosis not present

## 2020-11-13 DIAGNOSIS — E559 Vitamin D deficiency, unspecified: Secondary | ICD-10-CM | POA: Diagnosis not present

## 2020-11-13 DIAGNOSIS — E538 Deficiency of other specified B group vitamins: Secondary | ICD-10-CM | POA: Diagnosis not present

## 2020-11-13 DIAGNOSIS — R809 Proteinuria, unspecified: Secondary | ICD-10-CM | POA: Diagnosis not present

## 2020-11-13 DIAGNOSIS — I10 Essential (primary) hypertension: Secondary | ICD-10-CM | POA: Diagnosis not present

## 2020-11-20 DIAGNOSIS — E7849 Other hyperlipidemia: Secondary | ICD-10-CM | POA: Diagnosis not present

## 2020-11-20 DIAGNOSIS — E1122 Type 2 diabetes mellitus with diabetic chronic kidney disease: Secondary | ICD-10-CM | POA: Diagnosis not present

## 2020-11-20 DIAGNOSIS — N183 Chronic kidney disease, stage 3 unspecified: Secondary | ICD-10-CM | POA: Diagnosis not present

## 2020-11-20 DIAGNOSIS — I129 Hypertensive chronic kidney disease with stage 1 through stage 4 chronic kidney disease, or unspecified chronic kidney disease: Secondary | ICD-10-CM | POA: Diagnosis not present

## 2020-11-21 DIAGNOSIS — R0609 Other forms of dyspnea: Secondary | ICD-10-CM | POA: Diagnosis not present

## 2020-11-21 DIAGNOSIS — J069 Acute upper respiratory infection, unspecified: Secondary | ICD-10-CM | POA: Diagnosis not present

## 2020-11-22 DIAGNOSIS — Z23 Encounter for immunization: Secondary | ICD-10-CM | POA: Diagnosis not present

## 2020-11-22 DIAGNOSIS — R609 Edema, unspecified: Secondary | ICD-10-CM | POA: Diagnosis not present

## 2020-11-22 DIAGNOSIS — N184 Chronic kidney disease, stage 4 (severe): Secondary | ICD-10-CM | POA: Diagnosis not present

## 2020-12-04 DIAGNOSIS — N183 Chronic kidney disease, stage 3 unspecified: Secondary | ICD-10-CM | POA: Diagnosis not present

## 2020-12-13 ENCOUNTER — Other Ambulatory Visit (HOSPITAL_BASED_OUTPATIENT_CLINIC_OR_DEPARTMENT_OTHER): Payer: Self-pay | Admitting: Nephrology

## 2020-12-13 ENCOUNTER — Other Ambulatory Visit (HOSPITAL_COMMUNITY): Payer: Self-pay | Admitting: Nephrology

## 2020-12-13 DIAGNOSIS — I129 Hypertensive chronic kidney disease with stage 1 through stage 4 chronic kidney disease, or unspecified chronic kidney disease: Secondary | ICD-10-CM

## 2020-12-13 DIAGNOSIS — Z5181 Encounter for therapeutic drug level monitoring: Secondary | ICD-10-CM

## 2020-12-13 DIAGNOSIS — N170 Acute kidney failure with tubular necrosis: Secondary | ICD-10-CM | POA: Diagnosis not present

## 2020-12-13 DIAGNOSIS — E875 Hyperkalemia: Secondary | ICD-10-CM | POA: Diagnosis not present

## 2020-12-13 DIAGNOSIS — E1122 Type 2 diabetes mellitus with diabetic chronic kidney disease: Secondary | ICD-10-CM

## 2020-12-13 DIAGNOSIS — N189 Chronic kidney disease, unspecified: Secondary | ICD-10-CM | POA: Diagnosis not present

## 2020-12-17 DIAGNOSIS — E1122 Type 2 diabetes mellitus with diabetic chronic kidney disease: Secondary | ICD-10-CM | POA: Diagnosis not present

## 2020-12-17 DIAGNOSIS — N189 Chronic kidney disease, unspecified: Secondary | ICD-10-CM | POA: Diagnosis not present

## 2020-12-17 DIAGNOSIS — E875 Hyperkalemia: Secondary | ICD-10-CM | POA: Diagnosis not present

## 2020-12-17 DIAGNOSIS — I129 Hypertensive chronic kidney disease with stage 1 through stage 4 chronic kidney disease, or unspecified chronic kidney disease: Secondary | ICD-10-CM | POA: Diagnosis not present

## 2020-12-17 DIAGNOSIS — N170 Acute kidney failure with tubular necrosis: Secondary | ICD-10-CM | POA: Diagnosis not present

## 2020-12-20 DIAGNOSIS — E7849 Other hyperlipidemia: Secondary | ICD-10-CM | POA: Diagnosis not present

## 2020-12-20 DIAGNOSIS — E1122 Type 2 diabetes mellitus with diabetic chronic kidney disease: Secondary | ICD-10-CM | POA: Diagnosis not present

## 2020-12-20 DIAGNOSIS — I129 Hypertensive chronic kidney disease with stage 1 through stage 4 chronic kidney disease, or unspecified chronic kidney disease: Secondary | ICD-10-CM | POA: Diagnosis not present

## 2020-12-20 DIAGNOSIS — N183 Chronic kidney disease, stage 3 unspecified: Secondary | ICD-10-CM | POA: Diagnosis not present

## 2020-12-25 ENCOUNTER — Other Ambulatory Visit: Payer: Self-pay

## 2020-12-25 ENCOUNTER — Ambulatory Visit (HOSPITAL_COMMUNITY)
Admission: RE | Admit: 2020-12-25 | Discharge: 2020-12-25 | Disposition: A | Discharge status: Home / Self Care | Payer: Medicare Other | Source: Ambulatory Visit | Attending: Nephrology | Admitting: Nephrology

## 2020-12-25 DIAGNOSIS — E1122 Type 2 diabetes mellitus with diabetic chronic kidney disease: Secondary | ICD-10-CM | POA: Diagnosis not present

## 2020-12-25 DIAGNOSIS — N170 Acute kidney failure with tubular necrosis: Secondary | ICD-10-CM | POA: Insufficient documentation

## 2020-12-25 DIAGNOSIS — E875 Hyperkalemia: Secondary | ICD-10-CM | POA: Diagnosis not present

## 2020-12-25 DIAGNOSIS — Z5181 Encounter for therapeutic drug level monitoring: Secondary | ICD-10-CM | POA: Diagnosis not present

## 2020-12-25 DIAGNOSIS — I129 Hypertensive chronic kidney disease with stage 1 through stage 4 chronic kidney disease, or unspecified chronic kidney disease: Secondary | ICD-10-CM | POA: Insufficient documentation

## 2020-12-25 DIAGNOSIS — N189 Chronic kidney disease, unspecified: Secondary | ICD-10-CM | POA: Diagnosis not present

## 2021-01-16 DIAGNOSIS — E8722 Chronic metabolic acidosis: Secondary | ICD-10-CM | POA: Diagnosis not present

## 2021-01-16 DIAGNOSIS — E1122 Type 2 diabetes mellitus with diabetic chronic kidney disease: Secondary | ICD-10-CM | POA: Diagnosis not present

## 2021-01-16 DIAGNOSIS — I129 Hypertensive chronic kidney disease with stage 1 through stage 4 chronic kidney disease, or unspecified chronic kidney disease: Secondary | ICD-10-CM | POA: Diagnosis not present

## 2021-01-16 DIAGNOSIS — Z5181 Encounter for therapeutic drug level monitoring: Secondary | ICD-10-CM | POA: Diagnosis not present

## 2021-01-16 DIAGNOSIS — E875 Hyperkalemia: Secondary | ICD-10-CM | POA: Diagnosis not present

## 2021-01-16 DIAGNOSIS — R9341 Abnormal radiologic findings on diagnostic imaging of renal pelvis, ureter, or bladder: Secondary | ICD-10-CM | POA: Diagnosis not present

## 2021-01-16 DIAGNOSIS — N189 Chronic kidney disease, unspecified: Secondary | ICD-10-CM | POA: Diagnosis not present

## 2021-01-16 DIAGNOSIS — R809 Proteinuria, unspecified: Secondary | ICD-10-CM | POA: Diagnosis not present

## 2021-01-16 DIAGNOSIS — E1129 Type 2 diabetes mellitus with other diabetic kidney complication: Secondary | ICD-10-CM | POA: Diagnosis not present

## 2021-01-16 DIAGNOSIS — E211 Secondary hyperparathyroidism, not elsewhere classified: Secondary | ICD-10-CM | POA: Diagnosis not present

## 2021-03-10 DIAGNOSIS — R809 Proteinuria, unspecified: Secondary | ICD-10-CM | POA: Diagnosis not present

## 2021-03-10 DIAGNOSIS — N189 Chronic kidney disease, unspecified: Secondary | ICD-10-CM | POA: Diagnosis not present

## 2021-03-10 DIAGNOSIS — E211 Secondary hyperparathyroidism, not elsewhere classified: Secondary | ICD-10-CM | POA: Diagnosis not present

## 2021-03-10 DIAGNOSIS — E1129 Type 2 diabetes mellitus with other diabetic kidney complication: Secondary | ICD-10-CM | POA: Diagnosis not present

## 2021-03-10 DIAGNOSIS — E1122 Type 2 diabetes mellitus with diabetic chronic kidney disease: Secondary | ICD-10-CM | POA: Diagnosis not present

## 2021-03-18 DIAGNOSIS — D649 Anemia, unspecified: Secondary | ICD-10-CM | POA: Diagnosis not present

## 2021-03-18 DIAGNOSIS — R5382 Chronic fatigue, unspecified: Secondary | ICD-10-CM | POA: Diagnosis not present

## 2021-03-18 DIAGNOSIS — E7849 Other hyperlipidemia: Secondary | ICD-10-CM | POA: Diagnosis not present

## 2021-03-18 DIAGNOSIS — Z1329 Encounter for screening for other suspected endocrine disorder: Secondary | ICD-10-CM | POA: Diagnosis not present

## 2021-03-18 DIAGNOSIS — N183 Chronic kidney disease, stage 3 unspecified: Secondary | ICD-10-CM | POA: Diagnosis not present

## 2021-03-18 DIAGNOSIS — R7309 Other abnormal glucose: Secondary | ICD-10-CM | POA: Diagnosis not present

## 2021-03-18 DIAGNOSIS — E1165 Type 2 diabetes mellitus with hyperglycemia: Secondary | ICD-10-CM | POA: Diagnosis not present

## 2021-03-18 DIAGNOSIS — Z0001 Encounter for general adult medical examination with abnormal findings: Secondary | ICD-10-CM | POA: Diagnosis not present

## 2021-03-18 DIAGNOSIS — D529 Folate deficiency anemia, unspecified: Secondary | ICD-10-CM | POA: Diagnosis not present

## 2021-03-18 DIAGNOSIS — E782 Mixed hyperlipidemia: Secondary | ICD-10-CM | POA: Diagnosis not present

## 2021-03-18 DIAGNOSIS — D519 Vitamin B12 deficiency anemia, unspecified: Secondary | ICD-10-CM | POA: Diagnosis not present

## 2021-03-19 DIAGNOSIS — R809 Proteinuria, unspecified: Secondary | ICD-10-CM | POA: Diagnosis not present

## 2021-03-19 DIAGNOSIS — I129 Hypertensive chronic kidney disease with stage 1 through stage 4 chronic kidney disease, or unspecified chronic kidney disease: Secondary | ICD-10-CM | POA: Diagnosis not present

## 2021-03-19 DIAGNOSIS — E211 Secondary hyperparathyroidism, not elsewhere classified: Secondary | ICD-10-CM | POA: Diagnosis not present

## 2021-03-19 DIAGNOSIS — E1122 Type 2 diabetes mellitus with diabetic chronic kidney disease: Secondary | ICD-10-CM | POA: Diagnosis not present

## 2021-03-19 DIAGNOSIS — E1129 Type 2 diabetes mellitus with other diabetic kidney complication: Secondary | ICD-10-CM | POA: Diagnosis not present

## 2021-03-19 DIAGNOSIS — N189 Chronic kidney disease, unspecified: Secondary | ICD-10-CM | POA: Diagnosis not present

## 2021-03-19 DIAGNOSIS — D631 Anemia in chronic kidney disease: Secondary | ICD-10-CM | POA: Diagnosis not present

## 2021-03-26 DIAGNOSIS — N2581 Secondary hyperparathyroidism of renal origin: Secondary | ICD-10-CM | POA: Diagnosis not present

## 2021-03-26 DIAGNOSIS — R809 Proteinuria, unspecified: Secondary | ICD-10-CM | POA: Diagnosis not present

## 2021-03-26 DIAGNOSIS — E1122 Type 2 diabetes mellitus with diabetic chronic kidney disease: Secondary | ICD-10-CM | POA: Diagnosis not present

## 2021-03-26 DIAGNOSIS — E538 Deficiency of other specified B group vitamins: Secondary | ICD-10-CM | POA: Diagnosis not present

## 2021-03-26 DIAGNOSIS — E7849 Other hyperlipidemia: Secondary | ICD-10-CM | POA: Diagnosis not present

## 2021-03-26 DIAGNOSIS — E559 Vitamin D deficiency, unspecified: Secondary | ICD-10-CM | POA: Diagnosis not present

## 2021-03-26 DIAGNOSIS — Z0001 Encounter for general adult medical examination with abnormal findings: Secondary | ICD-10-CM | POA: Diagnosis not present

## 2021-03-26 DIAGNOSIS — I10 Essential (primary) hypertension: Secondary | ICD-10-CM | POA: Diagnosis not present

## 2021-04-09 DIAGNOSIS — E1122 Type 2 diabetes mellitus with diabetic chronic kidney disease: Secondary | ICD-10-CM | POA: Diagnosis not present

## 2021-04-09 DIAGNOSIS — N189 Chronic kidney disease, unspecified: Secondary | ICD-10-CM | POA: Diagnosis not present

## 2021-04-09 DIAGNOSIS — E1129 Type 2 diabetes mellitus with other diabetic kidney complication: Secondary | ICD-10-CM | POA: Diagnosis not present

## 2021-04-09 DIAGNOSIS — I129 Hypertensive chronic kidney disease with stage 1 through stage 4 chronic kidney disease, or unspecified chronic kidney disease: Secondary | ICD-10-CM | POA: Diagnosis not present

## 2021-04-09 DIAGNOSIS — R809 Proteinuria, unspecified: Secondary | ICD-10-CM | POA: Diagnosis not present

## 2021-04-24 DIAGNOSIS — E1122 Type 2 diabetes mellitus with diabetic chronic kidney disease: Secondary | ICD-10-CM | POA: Diagnosis not present

## 2021-04-24 DIAGNOSIS — E8722 Chronic metabolic acidosis: Secondary | ICD-10-CM | POA: Diagnosis not present

## 2021-04-24 DIAGNOSIS — E1129 Type 2 diabetes mellitus with other diabetic kidney complication: Secondary | ICD-10-CM | POA: Diagnosis not present

## 2021-04-24 DIAGNOSIS — R809 Proteinuria, unspecified: Secondary | ICD-10-CM | POA: Diagnosis not present

## 2021-04-24 DIAGNOSIS — N189 Chronic kidney disease, unspecified: Secondary | ICD-10-CM | POA: Diagnosis not present

## 2021-04-24 DIAGNOSIS — I129 Hypertensive chronic kidney disease with stage 1 through stage 4 chronic kidney disease, or unspecified chronic kidney disease: Secondary | ICD-10-CM | POA: Diagnosis not present

## 2021-04-24 DIAGNOSIS — D631 Anemia in chronic kidney disease: Secondary | ICD-10-CM | POA: Diagnosis not present

## 2021-04-24 DIAGNOSIS — E211 Secondary hyperparathyroidism, not elsewhere classified: Secondary | ICD-10-CM | POA: Diagnosis not present

## 2021-05-30 DIAGNOSIS — I129 Hypertensive chronic kidney disease with stage 1 through stage 4 chronic kidney disease, or unspecified chronic kidney disease: Secondary | ICD-10-CM | POA: Diagnosis not present

## 2021-05-30 DIAGNOSIS — E1122 Type 2 diabetes mellitus with diabetic chronic kidney disease: Secondary | ICD-10-CM | POA: Diagnosis not present

## 2021-05-30 DIAGNOSIS — E1129 Type 2 diabetes mellitus with other diabetic kidney complication: Secondary | ICD-10-CM | POA: Diagnosis not present

## 2021-05-30 DIAGNOSIS — N189 Chronic kidney disease, unspecified: Secondary | ICD-10-CM | POA: Diagnosis not present

## 2021-05-30 DIAGNOSIS — R809 Proteinuria, unspecified: Secondary | ICD-10-CM | POA: Diagnosis not present

## 2021-06-19 DIAGNOSIS — N189 Chronic kidney disease, unspecified: Secondary | ICD-10-CM | POA: Diagnosis not present

## 2021-06-19 DIAGNOSIS — N170 Acute kidney failure with tubular necrosis: Secondary | ICD-10-CM | POA: Diagnosis not present

## 2021-06-19 DIAGNOSIS — E1122 Type 2 diabetes mellitus with diabetic chronic kidney disease: Secondary | ICD-10-CM | POA: Diagnosis not present

## 2021-06-19 DIAGNOSIS — E1129 Type 2 diabetes mellitus with other diabetic kidney complication: Secondary | ICD-10-CM | POA: Diagnosis not present

## 2021-06-19 DIAGNOSIS — E211 Secondary hyperparathyroidism, not elsewhere classified: Secondary | ICD-10-CM | POA: Diagnosis not present

## 2021-06-19 DIAGNOSIS — R809 Proteinuria, unspecified: Secondary | ICD-10-CM | POA: Diagnosis not present

## 2021-06-19 DIAGNOSIS — I129 Hypertensive chronic kidney disease with stage 1 through stage 4 chronic kidney disease, or unspecified chronic kidney disease: Secondary | ICD-10-CM | POA: Diagnosis not present

## 2021-06-19 DIAGNOSIS — D631 Anemia in chronic kidney disease: Secondary | ICD-10-CM | POA: Diagnosis not present

## 2021-06-20 ENCOUNTER — Other Ambulatory Visit: Payer: Self-pay | Admitting: Nephrology

## 2021-06-20 ENCOUNTER — Other Ambulatory Visit (HOSPITAL_COMMUNITY): Payer: Self-pay | Admitting: Nephrology

## 2021-06-20 DIAGNOSIS — R809 Proteinuria, unspecified: Secondary | ICD-10-CM

## 2021-06-20 DIAGNOSIS — E1122 Type 2 diabetes mellitus with diabetic chronic kidney disease: Secondary | ICD-10-CM

## 2021-06-20 DIAGNOSIS — D631 Anemia in chronic kidney disease: Secondary | ICD-10-CM

## 2021-06-20 DIAGNOSIS — N170 Acute kidney failure with tubular necrosis: Secondary | ICD-10-CM

## 2021-06-20 DIAGNOSIS — I129 Hypertensive chronic kidney disease with stage 1 through stage 4 chronic kidney disease, or unspecified chronic kidney disease: Secondary | ICD-10-CM

## 2021-06-30 ENCOUNTER — Ambulatory Visit (HOSPITAL_COMMUNITY)
Admission: RE | Admit: 2021-06-30 | Discharge: 2021-06-30 | Disposition: A | Discharge status: Home / Self Care | Payer: Medicare Other | Source: Ambulatory Visit | Attending: Nephrology | Admitting: Nephrology

## 2021-06-30 DIAGNOSIS — N179 Acute kidney failure, unspecified: Secondary | ICD-10-CM | POA: Diagnosis not present

## 2021-06-30 DIAGNOSIS — E1122 Type 2 diabetes mellitus with diabetic chronic kidney disease: Secondary | ICD-10-CM | POA: Diagnosis not present

## 2021-06-30 DIAGNOSIS — R809 Proteinuria, unspecified: Secondary | ICD-10-CM | POA: Diagnosis not present

## 2021-06-30 DIAGNOSIS — D631 Anemia in chronic kidney disease: Secondary | ICD-10-CM | POA: Diagnosis not present

## 2021-06-30 DIAGNOSIS — I129 Hypertensive chronic kidney disease with stage 1 through stage 4 chronic kidney disease, or unspecified chronic kidney disease: Secondary | ICD-10-CM | POA: Diagnosis not present

## 2021-06-30 DIAGNOSIS — N170 Acute kidney failure with tubular necrosis: Secondary | ICD-10-CM | POA: Insufficient documentation

## 2021-06-30 DIAGNOSIS — N189 Chronic kidney disease, unspecified: Secondary | ICD-10-CM | POA: Insufficient documentation

## 2021-06-30 DIAGNOSIS — E1129 Type 2 diabetes mellitus with other diabetic kidney complication: Secondary | ICD-10-CM | POA: Insufficient documentation

## 2021-07-11 DIAGNOSIS — E1122 Type 2 diabetes mellitus with diabetic chronic kidney disease: Secondary | ICD-10-CM | POA: Diagnosis not present

## 2021-07-11 DIAGNOSIS — N170 Acute kidney failure with tubular necrosis: Secondary | ICD-10-CM | POA: Diagnosis not present

## 2021-07-11 DIAGNOSIS — E1129 Type 2 diabetes mellitus with other diabetic kidney complication: Secondary | ICD-10-CM | POA: Diagnosis not present

## 2021-07-11 DIAGNOSIS — N189 Chronic kidney disease, unspecified: Secondary | ICD-10-CM | POA: Diagnosis not present

## 2021-07-11 DIAGNOSIS — E211 Secondary hyperparathyroidism, not elsewhere classified: Secondary | ICD-10-CM | POA: Diagnosis not present

## 2021-07-30 DIAGNOSIS — D631 Anemia in chronic kidney disease: Secondary | ICD-10-CM | POA: Diagnosis not present

## 2021-07-30 DIAGNOSIS — R809 Proteinuria, unspecified: Secondary | ICD-10-CM | POA: Diagnosis not present

## 2021-07-30 DIAGNOSIS — N189 Chronic kidney disease, unspecified: Secondary | ICD-10-CM | POA: Diagnosis not present

## 2021-07-30 DIAGNOSIS — E1122 Type 2 diabetes mellitus with diabetic chronic kidney disease: Secondary | ICD-10-CM | POA: Diagnosis not present

## 2021-07-30 DIAGNOSIS — I129 Hypertensive chronic kidney disease with stage 1 through stage 4 chronic kidney disease, or unspecified chronic kidney disease: Secondary | ICD-10-CM | POA: Diagnosis not present

## 2021-07-30 DIAGNOSIS — E1129 Type 2 diabetes mellitus with other diabetic kidney complication: Secondary | ICD-10-CM | POA: Diagnosis not present

## 2021-07-30 DIAGNOSIS — E211 Secondary hyperparathyroidism, not elsewhere classified: Secondary | ICD-10-CM | POA: Diagnosis not present

## 2021-07-30 DIAGNOSIS — D508 Other iron deficiency anemias: Secondary | ICD-10-CM | POA: Diagnosis not present

## 2021-07-30 DIAGNOSIS — N170 Acute kidney failure with tubular necrosis: Secondary | ICD-10-CM | POA: Diagnosis not present

## 2021-08-01 DIAGNOSIS — D649 Anemia, unspecified: Secondary | ICD-10-CM | POA: Diagnosis not present

## 2021-08-01 DIAGNOSIS — N2581 Secondary hyperparathyroidism of renal origin: Secondary | ICD-10-CM | POA: Diagnosis not present

## 2021-08-01 DIAGNOSIS — E7849 Other hyperlipidemia: Secondary | ICD-10-CM | POA: Diagnosis not present

## 2021-08-01 DIAGNOSIS — D519 Vitamin B12 deficiency anemia, unspecified: Secondary | ICD-10-CM | POA: Diagnosis not present

## 2021-08-01 DIAGNOSIS — D529 Folate deficiency anemia, unspecified: Secondary | ICD-10-CM | POA: Diagnosis not present

## 2021-08-01 DIAGNOSIS — I10 Essential (primary) hypertension: Secondary | ICD-10-CM | POA: Diagnosis not present

## 2021-08-01 DIAGNOSIS — E559 Vitamin D deficiency, unspecified: Secondary | ICD-10-CM | POA: Diagnosis not present

## 2021-08-01 DIAGNOSIS — E1122 Type 2 diabetes mellitus with diabetic chronic kidney disease: Secondary | ICD-10-CM | POA: Diagnosis not present

## 2021-08-01 DIAGNOSIS — E1165 Type 2 diabetes mellitus with hyperglycemia: Secondary | ICD-10-CM | POA: Diagnosis not present

## 2021-08-05 DIAGNOSIS — R809 Proteinuria, unspecified: Secondary | ICD-10-CM | POA: Diagnosis not present

## 2021-08-05 DIAGNOSIS — E7849 Other hyperlipidemia: Secondary | ICD-10-CM | POA: Diagnosis not present

## 2021-08-05 DIAGNOSIS — D519 Vitamin B12 deficiency anemia, unspecified: Secondary | ICD-10-CM | POA: Diagnosis not present

## 2021-08-05 DIAGNOSIS — N2581 Secondary hyperparathyroidism of renal origin: Secondary | ICD-10-CM | POA: Diagnosis not present

## 2021-08-05 DIAGNOSIS — I10 Essential (primary) hypertension: Secondary | ICD-10-CM | POA: Diagnosis not present

## 2021-08-05 DIAGNOSIS — E1122 Type 2 diabetes mellitus with diabetic chronic kidney disease: Secondary | ICD-10-CM | POA: Diagnosis not present

## 2021-08-05 DIAGNOSIS — E559 Vitamin D deficiency, unspecified: Secondary | ICD-10-CM | POA: Diagnosis not present

## 2021-08-05 DIAGNOSIS — E538 Deficiency of other specified B group vitamins: Secondary | ICD-10-CM | POA: Diagnosis not present

## 2021-08-11 DIAGNOSIS — E7849 Other hyperlipidemia: Secondary | ICD-10-CM | POA: Diagnosis not present

## 2021-08-11 DIAGNOSIS — Z130 Encounter for screening for diseases of the blood and blood-forming organs and certain disorders involving the immune mechanism: Secondary | ICD-10-CM | POA: Diagnosis not present

## 2021-08-11 DIAGNOSIS — R5382 Chronic fatigue, unspecified: Secondary | ICD-10-CM | POA: Diagnosis not present

## 2021-08-11 DIAGNOSIS — N2581 Secondary hyperparathyroidism of renal origin: Secondary | ICD-10-CM | POA: Diagnosis not present

## 2021-08-11 DIAGNOSIS — N183 Chronic kidney disease, stage 3 unspecified: Secondary | ICD-10-CM | POA: Diagnosis not present

## 2021-08-11 DIAGNOSIS — D649 Anemia, unspecified: Secondary | ICD-10-CM | POA: Diagnosis not present

## 2021-08-11 DIAGNOSIS — M1000 Idiopathic gout, unspecified site: Secondary | ICD-10-CM | POA: Diagnosis not present

## 2021-08-21 DIAGNOSIS — N170 Acute kidney failure with tubular necrosis: Secondary | ICD-10-CM | POA: Diagnosis not present

## 2021-08-21 DIAGNOSIS — I129 Hypertensive chronic kidney disease with stage 1 through stage 4 chronic kidney disease, or unspecified chronic kidney disease: Secondary | ICD-10-CM | POA: Diagnosis not present

## 2021-08-21 DIAGNOSIS — E211 Secondary hyperparathyroidism, not elsewhere classified: Secondary | ICD-10-CM | POA: Diagnosis not present

## 2021-08-21 DIAGNOSIS — R809 Proteinuria, unspecified: Secondary | ICD-10-CM | POA: Diagnosis not present

## 2021-08-21 DIAGNOSIS — E1129 Type 2 diabetes mellitus with other diabetic kidney complication: Secondary | ICD-10-CM | POA: Diagnosis not present

## 2021-08-21 DIAGNOSIS — E1122 Type 2 diabetes mellitus with diabetic chronic kidney disease: Secondary | ICD-10-CM | POA: Diagnosis not present

## 2021-08-21 DIAGNOSIS — D508 Other iron deficiency anemias: Secondary | ICD-10-CM | POA: Diagnosis not present

## 2021-08-21 DIAGNOSIS — D631 Anemia in chronic kidney disease: Secondary | ICD-10-CM | POA: Diagnosis not present

## 2021-08-21 DIAGNOSIS — N189 Chronic kidney disease, unspecified: Secondary | ICD-10-CM | POA: Diagnosis not present

## 2021-08-23 ENCOUNTER — Other Ambulatory Visit (HOSPITAL_COMMUNITY): Payer: Self-pay | Admitting: Nephrology

## 2021-08-23 ENCOUNTER — Other Ambulatory Visit: Payer: Self-pay | Admitting: Nephrology

## 2021-08-23 DIAGNOSIS — E1122 Type 2 diabetes mellitus with diabetic chronic kidney disease: Secondary | ICD-10-CM

## 2021-08-23 DIAGNOSIS — N170 Acute kidney failure with tubular necrosis: Secondary | ICD-10-CM

## 2021-08-29 DIAGNOSIS — D509 Iron deficiency anemia, unspecified: Secondary | ICD-10-CM | POA: Diagnosis not present

## 2021-09-01 ENCOUNTER — Ambulatory Visit (HOSPITAL_COMMUNITY)
Admission: RE | Admit: 2021-09-01 | Discharge: 2021-09-01 | Disposition: A | Discharge status: Home / Self Care | Payer: Medicare Other | Source: Ambulatory Visit | Attending: Nephrology | Admitting: Nephrology

## 2021-09-01 DIAGNOSIS — E1122 Type 2 diabetes mellitus with diabetic chronic kidney disease: Secondary | ICD-10-CM | POA: Diagnosis not present

## 2021-09-01 DIAGNOSIS — N170 Acute kidney failure with tubular necrosis: Secondary | ICD-10-CM | POA: Insufficient documentation

## 2021-09-01 DIAGNOSIS — N189 Chronic kidney disease, unspecified: Secondary | ICD-10-CM | POA: Diagnosis not present

## 2021-09-05 DIAGNOSIS — D509 Iron deficiency anemia, unspecified: Secondary | ICD-10-CM | POA: Diagnosis not present

## 2021-09-30 DIAGNOSIS — N184 Chronic kidney disease, stage 4 (severe): Secondary | ICD-10-CM | POA: Diagnosis not present

## 2021-09-30 DIAGNOSIS — D519 Vitamin B12 deficiency anemia, unspecified: Secondary | ICD-10-CM | POA: Diagnosis not present

## 2021-09-30 DIAGNOSIS — E1122 Type 2 diabetes mellitus with diabetic chronic kidney disease: Secondary | ICD-10-CM | POA: Diagnosis not present

## 2021-09-30 DIAGNOSIS — E559 Vitamin D deficiency, unspecified: Secondary | ICD-10-CM | POA: Diagnosis not present

## 2021-09-30 DIAGNOSIS — D529 Folate deficiency anemia, unspecified: Secondary | ICD-10-CM | POA: Diagnosis not present

## 2021-09-30 DIAGNOSIS — N2581 Secondary hyperparathyroidism of renal origin: Secondary | ICD-10-CM | POA: Diagnosis not present

## 2021-09-30 DIAGNOSIS — E7849 Other hyperlipidemia: Secondary | ICD-10-CM | POA: Diagnosis not present

## 2021-09-30 DIAGNOSIS — D649 Anemia, unspecified: Secondary | ICD-10-CM | POA: Diagnosis not present

## 2021-09-30 DIAGNOSIS — R809 Proteinuria, unspecified: Secondary | ICD-10-CM | POA: Diagnosis not present

## 2021-09-30 DIAGNOSIS — I10 Essential (primary) hypertension: Secondary | ICD-10-CM | POA: Diagnosis not present

## 2021-09-30 DIAGNOSIS — M1000 Idiopathic gout, unspecified site: Secondary | ICD-10-CM | POA: Diagnosis not present

## 2021-10-09 ENCOUNTER — Encounter (INDEPENDENT_AMBULATORY_CARE_PROVIDER_SITE_OTHER): Payer: Self-pay | Admitting: *Deleted

## 2021-10-09 DIAGNOSIS — D508 Other iron deficiency anemias: Secondary | ICD-10-CM | POA: Diagnosis not present

## 2021-10-09 DIAGNOSIS — E211 Secondary hyperparathyroidism, not elsewhere classified: Secondary | ICD-10-CM | POA: Diagnosis not present

## 2021-10-09 DIAGNOSIS — I129 Hypertensive chronic kidney disease with stage 1 through stage 4 chronic kidney disease, or unspecified chronic kidney disease: Secondary | ICD-10-CM | POA: Diagnosis not present

## 2021-10-09 DIAGNOSIS — D631 Anemia in chronic kidney disease: Secondary | ICD-10-CM | POA: Diagnosis not present

## 2021-10-09 DIAGNOSIS — E1122 Type 2 diabetes mellitus with diabetic chronic kidney disease: Secondary | ICD-10-CM | POA: Diagnosis not present

## 2021-10-09 DIAGNOSIS — R809 Proteinuria, unspecified: Secondary | ICD-10-CM | POA: Diagnosis not present

## 2021-10-09 DIAGNOSIS — E1129 Type 2 diabetes mellitus with other diabetic kidney complication: Secondary | ICD-10-CM | POA: Diagnosis not present

## 2021-10-09 DIAGNOSIS — N189 Chronic kidney disease, unspecified: Secondary | ICD-10-CM | POA: Diagnosis not present

## 2021-10-23 ENCOUNTER — Ambulatory Visit: Payer: Medicare Other | Admitting: Urology

## 2021-10-23 ENCOUNTER — Encounter: Payer: Self-pay | Admitting: Urology

## 2021-10-23 VITALS — BP 165/62 | HR 84

## 2021-10-23 DIAGNOSIS — N401 Enlarged prostate with lower urinary tract symptoms: Secondary | ICD-10-CM | POA: Diagnosis not present

## 2021-10-23 DIAGNOSIS — N3289 Other specified disorders of bladder: Secondary | ICD-10-CM

## 2021-10-23 DIAGNOSIS — N433 Hydrocele, unspecified: Secondary | ICD-10-CM

## 2021-10-23 DIAGNOSIS — N138 Other obstructive and reflux uropathy: Secondary | ICD-10-CM

## 2021-10-23 DIAGNOSIS — N184 Chronic kidney disease, stage 4 (severe): Secondary | ICD-10-CM | POA: Diagnosis not present

## 2021-10-23 DIAGNOSIS — R3915 Urgency of urination: Secondary | ICD-10-CM | POA: Diagnosis not present

## 2021-10-23 LAB — URINALYSIS, ROUTINE W REFLEX MICROSCOPIC
Bilirubin, UA: NEGATIVE
Glucose, UA: NEGATIVE
Ketones, UA: NEGATIVE
Leukocytes,UA: NEGATIVE
Nitrite, UA: NEGATIVE
RBC, UA: NEGATIVE
Specific Gravity, UA: 1.015 (ref 1.005–1.030)
Urobilinogen, Ur: 0.2 mg/dL (ref 0.2–1.0)
pH, UA: 6 (ref 5.0–7.5)

## 2021-10-23 LAB — MICROSCOPIC EXAMINATION
Bacteria, UA: NONE SEEN
Epithelial Cells (non renal): NONE SEEN /hpf (ref 0–10)
RBC, Urine: NONE SEEN /hpf (ref 0–2)
Renal Epithel, UA: NONE SEEN /hpf
WBC, UA: NONE SEEN /hpf (ref 0–5)

## 2021-10-23 LAB — BLADDER SCAN AMB NON-IMAGING: Scan Result: 151

## 2021-10-23 NOTE — Progress Notes (Signed)
Subjective: 1. Bladder wall thickening   2. BPH with urinary obstruction   3. Urgency of urination      Consult requested by Dr. Ulice Bold.  Mr. Devin [name] is a 76 yo male who is sent in consultation for the finding of bladder wall thickening on a renal US done for f/u of his renal insufficiency.  The bladder thickening is stable back to 8/22.  He has CKD4 with the last labs from 07/11/21 having a Cr of 3.89 and a GFR of 15.  His IPSS is 13 with urgency and doesn't always feel empty.   His UA today is clear.  ROS:  Review of Systems  Respiratory:  Positive for cough.   Cardiovascular:  Positive for leg swelling.  Genitourinary:  Positive for frequency and urgency.  Musculoskeletal:  Positive for back pain.  All other systems reviewed and are negative.   No Known Allergies  Past Medical History:  Diagnosis Date   Arthritis    CKD (chronic kidney disease) stage 4, GFR 15-29 ml/min (HCC)    Diabetes mellitus without complication (HCC)    Gout    Hypertension     Past Surgical History:  Procedure Laterality Date   KNEE SURGERY     TONSILLECTOMY      Social History   Socioeconomic History   Marital status: Married    Spouse name: Not on file   Number of children: Not on file   Years of education: Not on file   Highest education level: Not on file  Occupational History   Not on file  Tobacco Use   Smoking status: Former    Years: 20.00    Types: Cigarettes    Quit date: 6    Years since quitting: 30.6    Passive exposure: Past   Smokeless tobacco: Never  Substance and Sexual Activity   Alcohol use: Never   Drug use: Never   Sexual activity: Yes  Other Topics Concern   Not on file  Social History Narrative   Not on file   Social Determinants of Health   Financial Resource Strain: Not on file  Food Insecurity: Not on file  Transportation Needs: Not on file  Physical Activity: Not on file  Stress: Not on file  Social Connections: Not on file   Intimate Partner Violence: Not on file    Family History  Family history unknown: Yes    Anti-infectives: Anti-infectives (From admission, onward)    None       Current Outpatient Medications  Medication Sig Dispense Refill   allopurinol (ZYLOPRIM) 300 MG tablet Take 300 mg by mouth 1 day or 1 dose.     amLODipine (NORVASC) 10 MG tablet Take 10 mg by mouth 1 day or 1 dose.     aspirin EC 81 MG tablet Take 81 mg by mouth daily. Swallow whole.     carvedilol (COREG) 12.5 MG tablet Take 12.5 mg by mouth 2 (two) times daily with a meal.     cloNIDine (CATAPRES) 0.3 MG tablet Take 0.3 mg by mouth in the morning, at noon, and at bedtime.     doxercalciferol (HECTOROL) 0.5 MCG capsule Take 0.5 mcg by mouth 3 (three) times a week.     furosemide (LASIX) 40 MG tablet Take 40 mg by mouth daily.     GLIPIZIDE XL 10 MG 24 hr tablet Take 10 mg by mouth daily.     hydrALAZINE (APRESOLINE) 100 MG tablet Take 100 mg by mouth in  the morning, at noon, and at bedtime.     simvastatin (ZOCOR) 20 MG tablet Take 20 mg by mouth daily.     No current facility-administered medications for this visit.     Objective: Vital signs in last 24 hours: BP (!) 165/62   Pulse 84   Intake/Output from previous day: No intake/output data recorded. Intake/Output this shift: @IOTHISSHIFT @   Physical Exam Vitals reviewed.  Constitutional:      Appearance: Normal appearance. He is obese.  Cardiovascular:     Rate and Rhythm: Normal rate and regular rhythm.     Heart sounds: Normal heart sounds.  Pulmonary:     Effort: Pulmonary effort is normal. No respiratory distress.     Breath sounds: Normal breath sounds.  Abdominal:     Palpations: Abdomen is soft. There is no mass.     Hernia: No hernia is present.  Genitourinary:    Comments: Uncirc phallus with mild phimosis and an adequate meatus. Scrotum: large soft right hydrocele and small left hydrocele. Right testicle non-palpable in the  hydrocele. Left testicle and epididymis normal. AP/NST without lesions. Prostate 1.5+ benign. SV non-palpable.  Musculoskeletal:        General: No swelling or tenderness. Normal range of motion.  Skin:    General: Skin is warm and dry.  Neurological:     General: No focal deficit present.     Mental Status: He is alert and oriented to person, place, and time.  Psychiatric:        Mood and Affect: Mood normal.        Behavior: Behavior normal.     Lab Results:  Results for orders placed or performed in visit on 10/23/21 (from the past 24 hour(s))  Urinalysis, Routine w reflex microscopic     Status: Abnormal   Collection Time: 10/23/21  2:06 PM  Result Value Ref Range   Specific Gravity, UA 1.015 1.005 - 1.030   pH, UA 6.0 5.0 - 7.5   Color, UA Yellow Yellow   Appearance Ur Clear Clear   Leukocytes,UA Negative Negative   Protein,UA 3+ (A) Negative/Trace   Glucose, UA Negative Negative   Ketones, UA Negative Negative   RBC, UA Negative Negative   Bilirubin, UA Negative Negative   Urobilinogen, Ur 0.2 0.2 - 1.0 mg/dL   Nitrite, UA Negative Negative   Microscopic Examination See below:    Narrative   Performed at:  [facility] [address], [place], [place]  [id number] Lab Director: Mina Marble [place], Phone:  [phone]  Microscopic Examination     Status: None   Collection Time: 10/23/21  2:06 PM   Urine  Result Value Ref Range   WBC, UA None seen 0 - 5 /hpf   RBC, Urine None seen 0 - 2 /hpf   Epithelial Cells (non renal) None seen 0 - 10 /hpf   Renal Epithel, UA None seen None seen /hpf   Mucus, UA Present Not Estab.   Bacteria, UA None seen None seen/Few   Narrative   Performed at:  [facility] [address], [place], [place]  [id number] Lab Director: [place], Phone:  [phone]    BMET No results for input(s): "NA", "K", "CL", "CO2", "GLUCOSE", "BUN", "CREATININE", "CALCIUM" in the last 72 hours. PT/INR No results  for input(s): "LABPROT", "INR" in the last 72 hours. ABG No results for input(s): "PHART", "HCO3" in the last 72 hours.  Invalid input(s): "PCO2", "PO2" I  have reviewed labs in [place].  Studies/Results: No results found. Several renal US's reviewed from 8/22 to current.  Assessment/Plan: BPH with BOO and irregular bladder wall thickening.   I will have him return for cystoscopy.  Right hydrocele.  I discussed options for treatment and will discuss that further at f/u.   No orders of the defined types were placed in this encounter.    Orders Placed This Encounter  Procedures   Microscopic Examination   Urinalysis, Routine w reflex microscopic   Bladder Scan (Post Void Residual) in office     Return for Next available cystoscopy .    CC: Dr. Dicky Doe.      Irine Seal 10/24/2021 [phone]

## 2021-10-29 ENCOUNTER — Ambulatory Visit: Payer: Medicare Other | Admitting: Vascular Surgery

## 2021-10-29 ENCOUNTER — Encounter: Payer: Self-pay | Admitting: Vascular Surgery

## 2021-10-29 VITALS — BP 127/71 | HR 60 | Temp 98.4°F | Resp 16 | Ht 69.0 in | Wt 228.4 lb

## 2021-10-29 DIAGNOSIS — N184 Chronic kidney disease, stage 4 (severe): Secondary | ICD-10-CM | POA: Diagnosis not present

## 2021-10-29 NOTE — Progress Notes (Signed)
[facility] [place]  Patient name: Devin [name] MRN: [id number] DOB: 09-12-1945 Sex: male  REASON FOR CONSULT: Discuss access for hemodialysis  HPI: Devin [name] is a 76 y.o. male, who is here today for discussion of access for hemodialysis.  He is here today with his wife.  He has a long history of progressive renal insufficiency and is now approaching need for hemodialysis.  He does not have a pacemaker.  He is not on anticoagulant.  This renal insufficiency is felt to be due to diabetes and hypertension.  Past Medical History:  Diagnosis Date   Arthritis    CKD (chronic kidney disease) stage 4, GFR 15-29 ml/min (HCC)    Diabetes mellitus without complication ([place])    Gout    Hypertension     Family History  Family history unknown: Yes    SOCIAL HISTORY: Social History   Socioeconomic History   Marital status: Married    Spouse name: Not on file   Number of children: Not on file   Years of education: Not on file   Highest education level: Not on file  Occupational History   Not on file  Tobacco Use   Smoking status: Former    Years: 20.00    Types: Cigarettes    Quit date: 56    Years since quitting: 30.6    Passive exposure: Past   Smokeless tobacco: Never  Substance and Sexual Activity   Alcohol use: Never   Drug use: Never   Sexual activity: Yes  Other Topics Concern   Not on file  Social History Narrative   Not on file   Social Determinants of Health   Financial Resource Strain: Not on file  Food Insecurity: Not on file  Transportation Needs: Not on file  Physical Activity: Not on file  Stress: Not on file  Social Connections: Not on file  Intimate Partner Violence: Not on file    No Known Allergies  Current Outpatient Medications  Medication Sig Dispense Refill   allopurinol (ZYLOPRIM) 300 MG tablet Take 300 mg by mouth 1 day or 1 dose.     amLODipine (NORVASC) 10 MG tablet Take 10  mg by mouth 1 day or 1 dose.     aspirin EC 81 MG tablet Take 81 mg by mouth daily. Swallow whole.     carvedilol (COREG) 12.5 MG tablet Take 12.5 mg by mouth 2 (two) times daily with a meal.     cloNIDine (CATAPRES) 0.3 MG tablet Take 0.3 mg by mouth in the morning, at noon, and at bedtime.     doxercalciferol (HECTOROL) 0.5 MCG capsule Take 0.5 mcg by mouth 3 (three) times a week.     furosemide (LASIX) 40 MG tablet Take 40 mg by mouth daily.     GLIPIZIDE XL 10 MG 24 hr tablet Take 10 mg by mouth daily.     hydrALAZINE (APRESOLINE) 100 MG tablet Take 100 mg by mouth in the morning, at noon, and at bedtime.     simvastatin (ZOCOR) 20 MG tablet Take 20 mg by mouth daily.     No current facility-administered medications for this visit.    REVIEW OF SYSTEMS:  [X]  denotes positive finding, [ ]  denotes negative finding Cardiac  Comments:  Chest pain or chest pressure:    Shortness of breath upon exertion:    Short of breath when lying flat:    Irregular heart rhythm:        Vascular  Pain in calf, thigh, or hip brought on by ambulation:    Pain in feet at night that wakes you up from your sleep:     Blood clot in your veins:    Leg swelling:         Pulmonary    Oxygen at home:    Productive cough:     Wheezing:         Neurologic    Sudden weakness in arms or legs:     Sudden numbness in arms or legs:     Sudden onset of difficulty speaking or slurred speech:    Temporary loss of vision in one eye:     Problems with dizziness:         Gastrointestinal    Blood in stool:     Vomited blood:         Genitourinary    Burning when urinating:     Blood in urine:        Psychiatric    Major depression:         Hematologic    Bleeding problems:    Problems with blood clotting too easily:        Skin    Rashes or ulcers:        Constitutional    Fever or chills:      PHYSICAL EXAM: Vitals:   10/29/21 1446  BP: 127/71  Pulse: 60  Resp: 16  Temp: 98.4 F (36.9  C)  TempSrc: Temporal  SpO2: 95%  Weight: 228 lb 6.4 oz (103.6 kg)  Height: 5\' 9"  (1.753 m)    GENERAL: The patient is a well-nourished male, in no acute distress. The vital signs are documented above. CARDIOVASCULAR: 2+ radial pulses bilaterally.  Easily visible cephalic vein at the wrist bilaterally. PULMONARY: There is good air exchange  MUSCULOSKELETAL: There are no major deformities or cyanosis. NEUROLOGIC: No focal weakness or paresthesias are detected. SKIN: There are no ulcers or rashes noted. PSYCHIATRIC: The patient has a normal affect.  DATA:  I did image his cephalic vein with SonoSite ultrasound bilaterally.  This does show good caliber cephalic vein bilaterally  MEDICAL ISSUES: Had long discussion with the patient regarding options for hemodialysis.  I discussed tunneled catheter for acute dialysis.  Also discussed AV fistula and AV graft.  He does appear to be an excellent candidate for AV fistula creation.  I Splane the surgical procedure as an outpatient at [facility].  Also explained the potential for not maturation.  I explained also that the longer the fistula is in place the better it becomes for dialysis access and would recommend proceeding.  We will schedule this as an outpatient next week   Rosetta Posner, MD [facility] [phone] Pager [phone]  Note: Portions of this report may have been transcribed using voice recognition software.  Every effort has been made to ensure accuracy; however, inadvertent computerized transcription errors may still be present.

## 2021-11-03 ENCOUNTER — Telehealth: Payer: Self-pay

## 2021-11-03 NOTE — Telephone Encounter (Signed)
Spoke with patient to schedule him for left arm AVF with Dr. Donnetta Hutching but he wants to wait until after his appointment with Dr. Theador Hawthorne on Sept 7 to schedule surgery.

## 2021-11-17 DIAGNOSIS — I10 Essential (primary) hypertension: Secondary | ICD-10-CM | POA: Diagnosis not present

## 2021-11-17 DIAGNOSIS — E1122 Type 2 diabetes mellitus with diabetic chronic kidney disease: Secondary | ICD-10-CM | POA: Diagnosis not present

## 2021-11-17 DIAGNOSIS — N184 Chronic kidney disease, stage 4 (severe): Secondary | ICD-10-CM | POA: Diagnosis not present

## 2021-11-20 ENCOUNTER — Ambulatory Visit: Payer: Medicare Other | Admitting: Urology

## 2021-11-20 VITALS — BP 170/75 | HR 75 | Ht 69.0 in | Wt 236.0 lb

## 2021-11-20 DIAGNOSIS — N401 Enlarged prostate with lower urinary tract symptoms: Secondary | ICD-10-CM | POA: Diagnosis not present

## 2021-11-20 DIAGNOSIS — N3289 Other specified disorders of bladder: Secondary | ICD-10-CM | POA: Diagnosis not present

## 2021-11-20 DIAGNOSIS — N433 Hydrocele, unspecified: Secondary | ICD-10-CM

## 2021-11-20 DIAGNOSIS — N138 Other obstructive and reflux uropathy: Secondary | ICD-10-CM | POA: Diagnosis not present

## 2021-11-20 MED ORDER — CIPROFLOXACIN HCL 500 MG PO TABS
500.0000 mg | ORAL_TABLET | Freq: Once | ORAL | Status: AC
  Administered 2021-11-20: 500 mg via ORAL

## 2021-11-20 NOTE — Progress Notes (Signed)
Subjective: 1. Bladder wall thickening      11/20/21: Devin [name] returns today for cystoscopy to complete the evaluation of his bladder wall thickening.   10/23/21: Devin [name] is a 76 yo male who is sent in consultation for the finding of bladder wall thickening on a renal US done for f/u of his renal insufficiency.  The bladder thickening is stable back to 8/22.  He has CKD4 with the last labs from 07/11/21 having a Cr of 3.89 and a GFR of 15.  His IPSS is 13 with urgency and doesn't always feel empty.   His UA today is clear.  ROS:  ROS  No Known Allergies  Past Medical History:  Diagnosis Date   Arthritis    CKD (chronic kidney disease) stage 4, GFR 15-29 ml/min (HCC)    Diabetes mellitus without complication (HCC)    Gout    Hypertension     Past Surgical History:  Procedure Laterality Date   KNEE SURGERY     TONSILLECTOMY      Social History   Socioeconomic History   Marital status: Married    Spouse name: Not on file   Number of children: Not on file   Years of education: Not on file   Highest education level: Not on file  Occupational History   Not on file  Tobacco Use   Smoking status: Former    Years: 20.00    Types: Cigarettes    Quit date: 37    Years since quitting: 30.6    Passive exposure: Past   Smokeless tobacco: Never  Substance and Sexual Activity   Alcohol use: Never   Drug use: Never   Sexual activity: Yes  Other Topics Concern   Not on file  Social History Narrative   Not on file   Social Determinants of Health   Financial Resource Strain: Not on file  Food Insecurity: Not on file  Transportation Needs: Not on file  Physical Activity: Not on file  Stress: Not on file  Social Connections: Not on file  Intimate Partner Violence: Not on file    Family History  Family history unknown: Yes    Anti-infectives: Anti-infectives (From admission, onward)    Start     Dose/Rate Route Frequency Ordered Stop   11/20/21 1415   CIPROFLOXACIN HCL 500 MG PO TABS        500 mg Oral  Once 11/20/21 1411 11/20/21 1416       Current Outpatient Medications  Medication Sig Dispense Refill   allopurinol (ZYLOPRIM) 300 MG tablet Take 300 mg by mouth 1 day or 1 dose.     amLODipine (NORVASC) 10 MG tablet Take 10 mg by mouth 1 day or 1 dose.     aspirin EC 81 MG tablet Take 81 mg by mouth daily. Swallow whole.     carvedilol (COREG) 12.5 MG tablet Take 12.5 mg by mouth 2 (two) times daily with a meal.     cloNIDine (CATAPRES) 0.3 MG tablet Take 0.3 mg by mouth in the morning, at noon, and at bedtime.     doxercalciferol (HECTOROL) 0.5 MCG capsule Take 0.5 mcg by mouth 3 (three) times a week.     furosemide (LASIX) 40 MG tablet Take 40 mg by mouth daily.     GLIPIZIDE XL 10 MG 24 hr tablet Take 10 mg by mouth daily.     hydrALAZINE (APRESOLINE) 100 MG tablet Take 100 mg by mouth in the morning, at noon, and  at bedtime.     simvastatin (ZOCOR) 20 MG tablet Take 20 mg by mouth daily.     No current facility-administered medications for this visit.     Objective: Vital signs in last 24 hours: BP (!) 170/75   Pulse 75   Ht 5\' 9"  (1.753 m)   Wt 236 lb (107 kg)   BMI 34.85 kg/m   Intake/Output from previous day: No intake/output data recorded. Intake/Output this shift: @IOTHISSHIFT @   Physical Exam  Lab Results:  No results found for this or any previous visit (from the past 24 hour(s)).   BMET No results for input(s): "NA", "K", "CL", "CO2", "GLUCOSE", "BUN", "CREATININE", "CALCIUM" in the last 72 hours. PT/INR No results for input(s): "LABPROT", "INR" in the last 72 hours. ABG No results for input(s): "PHART", "HCO3" in the last 72 hours.  Invalid input(s): "PCO2", "PO2" UA looks ok.   Studies/Results: No results found. Procedure: Flexible cystoscopy.  He was prepped with betadine and 2% lidocaine jelly.  Cipro 500mg  was given.  The urethra was normal.  The prostate was short with a high bladder  neck.  The bladder wall had mild trabeculation without lesions.  The UO's were normal.   Assessment/Plan: BPH with BOO and irregular bladder wall thickening.   Cystoscopy is negative.     Right hydrocele.  I discussed options for treatment and he will let me know if he wants to have that done.  Meds ordered this encounter  Medications   ciprofloxacin (CIPRO) tablet 500 mg     Orders Placed This Encounter  Procedures   Urinalysis, Routine w reflex microscopic     Return if symptoms worsen or fail to improve.    CC: Dr. Dicky Doe.      Irine Seal 11/21/2021 [phone]

## 2021-11-21 ENCOUNTER — Encounter: Payer: Self-pay | Admitting: Urology

## 2021-11-25 ENCOUNTER — Inpatient Hospital Stay (HOSPITAL_COMMUNITY)
Admission: EM | Admit: 2021-11-25 | Discharge: 2021-11-28 | DRG: 637 | Disposition: A | Discharge status: Home Health | Payer: Medicare Other | Attending: Internal Medicine | Admitting: Internal Medicine

## 2021-11-25 ENCOUNTER — Observation Stay (HOSPITAL_COMMUNITY): Payer: Medicare Other

## 2021-11-25 ENCOUNTER — Encounter (HOSPITAL_COMMUNITY): Payer: Self-pay

## 2021-11-25 ENCOUNTER — Emergency Department (HOSPITAL_COMMUNITY): Payer: Medicare Other

## 2021-11-25 ENCOUNTER — Other Ambulatory Visit: Payer: Self-pay

## 2021-11-25 DIAGNOSIS — Z992 Dependence on renal dialysis: Secondary | ICD-10-CM

## 2021-11-25 DIAGNOSIS — I10 Essential (primary) hypertension: Secondary | ICD-10-CM | POA: Diagnosis not present

## 2021-11-25 DIAGNOSIS — Z7982 Long term (current) use of aspirin: Secondary | ICD-10-CM

## 2021-11-25 DIAGNOSIS — E1122 Type 2 diabetes mellitus with diabetic chronic kidney disease: Secondary | ICD-10-CM | POA: Diagnosis present

## 2021-11-25 DIAGNOSIS — E669 Obesity, unspecified: Secondary | ICD-10-CM | POA: Diagnosis present

## 2021-11-25 DIAGNOSIS — I129 Hypertensive chronic kidney disease with stage 1 through stage 4 chronic kidney disease, or unspecified chronic kidney disease: Secondary | ICD-10-CM | POA: Diagnosis present

## 2021-11-25 DIAGNOSIS — E8770 Fluid overload, unspecified: Secondary | ICD-10-CM | POA: Diagnosis present

## 2021-11-25 DIAGNOSIS — G9341 Metabolic encephalopathy: Secondary | ICD-10-CM

## 2021-11-25 DIAGNOSIS — E11649 Type 2 diabetes mellitus with hypoglycemia without coma: Secondary | ICD-10-CM | POA: Diagnosis not present

## 2021-11-25 DIAGNOSIS — Z6834 Body mass index (BMI) 34.0-34.9, adult: Secondary | ICD-10-CM | POA: Diagnosis not present

## 2021-11-25 DIAGNOSIS — Z7984 Long term (current) use of oral hypoglycemic drugs: Secondary | ICD-10-CM

## 2021-11-25 DIAGNOSIS — Z20822 Contact with and (suspected) exposure to covid-19: Secondary | ICD-10-CM | POA: Diagnosis present

## 2021-11-25 DIAGNOSIS — M109 Gout, unspecified: Secondary | ICD-10-CM | POA: Diagnosis not present

## 2021-11-25 DIAGNOSIS — N179 Acute kidney failure, unspecified: Secondary | ICD-10-CM | POA: Diagnosis not present

## 2021-11-25 DIAGNOSIS — N184 Chronic kidney disease, stage 4 (severe): Secondary | ICD-10-CM | POA: Diagnosis present

## 2021-11-25 DIAGNOSIS — J90 Pleural effusion, not elsewhere classified: Secondary | ICD-10-CM | POA: Diagnosis not present

## 2021-11-25 DIAGNOSIS — J189 Pneumonia, unspecified organism: Secondary | ICD-10-CM

## 2021-11-25 DIAGNOSIS — J690 Pneumonitis due to inhalation of food and vomit: Secondary | ICD-10-CM | POA: Diagnosis not present

## 2021-11-25 DIAGNOSIS — R9431 Abnormal electrocardiogram [ECG] [EKG]: Secondary | ICD-10-CM | POA: Diagnosis not present

## 2021-11-25 DIAGNOSIS — R0600 Dyspnea, unspecified: Secondary | ICD-10-CM | POA: Diagnosis not present

## 2021-11-25 DIAGNOSIS — Z79899 Other long term (current) drug therapy: Secondary | ICD-10-CM | POA: Diagnosis not present

## 2021-11-25 DIAGNOSIS — E162 Hypoglycemia, unspecified: Principal | ICD-10-CM

## 2021-11-25 DIAGNOSIS — J9601 Acute respiratory failure with hypoxia: Secondary | ICD-10-CM | POA: Diagnosis present

## 2021-11-25 DIAGNOSIS — M1990 Unspecified osteoarthritis, unspecified site: Secondary | ICD-10-CM | POA: Diagnosis present

## 2021-11-25 DIAGNOSIS — E11641 Type 2 diabetes mellitus with hypoglycemia with coma: Secondary | ICD-10-CM | POA: Diagnosis not present

## 2021-11-25 DIAGNOSIS — Z87891 Personal history of nicotine dependence: Secondary | ICD-10-CM | POA: Diagnosis not present

## 2021-11-25 LAB — GLUCOSE, CAPILLARY
Glucose-Capillary: 107 mg/dL — ABNORMAL HIGH (ref 70–99)
Glucose-Capillary: 108 mg/dL — ABNORMAL HIGH (ref 70–99)
Glucose-Capillary: 130 mg/dL — ABNORMAL HIGH (ref 70–99)
Glucose-Capillary: 323 mg/dL — ABNORMAL HIGH (ref 70–99)
Glucose-Capillary: 36 mg/dL — CL (ref 70–99)
Glucose-Capillary: 45 mg/dL — ABNORMAL LOW (ref 70–99)
Glucose-Capillary: 54 mg/dL — ABNORMAL LOW (ref 70–99)
Glucose-Capillary: 59 mg/dL — ABNORMAL LOW (ref 70–99)
Glucose-Capillary: 63 mg/dL — ABNORMAL LOW (ref 70–99)
Glucose-Capillary: 68 mg/dL — ABNORMAL LOW (ref 70–99)
Glucose-Capillary: 69 mg/dL — ABNORMAL LOW (ref 70–99)
Glucose-Capillary: 78 mg/dL (ref 70–99)
Glucose-Capillary: 80 mg/dL (ref 70–99)
Glucose-Capillary: 83 mg/dL (ref 70–99)
Glucose-Capillary: 88 mg/dL (ref 70–99)
Glucose-Capillary: 93 mg/dL (ref 70–99)

## 2021-11-25 LAB — HEMOGLOBIN A1C
Hgb A1c MFr Bld: 6 % — ABNORMAL HIGH (ref 4.8–5.6)
Mean Plasma Glucose: 125.5 mg/dL

## 2021-11-25 LAB — CBC WITH DIFFERENTIAL/PLATELET
Abs Immature Granulocytes: 0.04 10*3/uL (ref 0.00–0.07)
Abs Immature Granulocytes: 0.04 10*3/uL (ref 0.00–0.07)
Basophils Absolute: 0 10*3/uL (ref 0.0–0.1)
Basophils Absolute: 0.1 10*3/uL (ref 0.0–0.1)
Basophils Relative: 0 %
Basophils Relative: 1 %
Eosinophils Absolute: 0.2 10*3/uL (ref 0.0–0.5)
Eosinophils Absolute: 0.2 10*3/uL (ref 0.0–0.5)
Eosinophils Relative: 2 %
Eosinophils Relative: 2 %
HCT: 32.5 % — ABNORMAL LOW (ref 39.0–52.0)
HCT: 32.8 % — ABNORMAL LOW (ref 39.0–52.0)
Hemoglobin: 10.6 g/dL — ABNORMAL LOW (ref 13.0–17.0)
Hemoglobin: 10.4 g/dL — ABNORMAL LOW (ref 13.0–17.0)
Immature Granulocytes: 0 %
Immature Granulocytes: 0 %
Lymphocytes Relative: 11 %
Lymphocytes Relative: 16 %
Lymphs Abs: 1 10*3/uL (ref 0.7–4.0)
Lymphs Abs: 1.4 10*3/uL (ref 0.7–4.0)
MCH: 29.9 pg (ref 26.0–34.0)
MCH: 30.1 pg (ref 26.0–34.0)
MCHC: 32.6 g/dL (ref 30.0–36.0)
MCHC: 31.7 g/dL (ref 30.0–36.0)
MCV: 91.8 fL (ref 80.0–100.0)
MCV: 95.1 fL (ref 80.0–100.0)
Monocytes Absolute: 0.4 10*3/uL (ref 0.1–1.0)
Monocytes Absolute: 0.6 10*3/uL (ref 0.1–1.0)
Monocytes Relative: 4 %
Monocytes Relative: 7 %
Neutro Abs: 7.8 10*3/uL — ABNORMAL HIGH (ref 1.7–7.7)
Neutro Abs: 6.8 10*3/uL (ref 1.7–7.7)
Neutrophils Relative %: 83 %
Neutrophils Relative %: 74 %
Platelets: 340 10*3/uL (ref 150–400)
Platelets: 317 10*3/uL (ref 150–400)
RBC: 3.54 MIL/uL — ABNORMAL LOW (ref 4.22–5.81)
RBC: 3.45 MIL/uL — ABNORMAL LOW (ref 4.22–5.81)
RDW: 15.7 % — ABNORMAL HIGH (ref 11.5–15.5)
RDW: 15.9 % — ABNORMAL HIGH (ref 11.5–15.5)
WBC: 9.4 10*3/uL (ref 4.0–10.5)
WBC: 9.1 10*3/uL (ref 4.0–10.5)
nRBC: 0 % (ref 0.0–0.2)
nRBC: 0 % (ref 0.0–0.2)

## 2021-11-25 LAB — URINALYSIS, ROUTINE W REFLEX MICROSCOPIC
Bilirubin Urine: NEGATIVE
Glucose, UA: NEGATIVE mg/dL
Hgb urine dipstick: NEGATIVE
Ketones, ur: NEGATIVE mg/dL
Leukocytes,Ua: NEGATIVE
Nitrite: NEGATIVE
Protein, ur: 100 mg/dL — AB
Specific Gravity, Urine: >1.03 — ABNORMAL HIGH (ref 1.005–1.030)
pH: 5.5 (ref 5.0–8.0)

## 2021-11-25 LAB — ECHOCARDIOGRAM COMPLETE
AR max vel: 2.37 cm2
AV Area VTI: 2.58 cm2
AV Area mean vel: 2.48 cm2
AV Mean grad: 7 mmHg
AV Peak grad: 11.3 mmHg
Ao pk vel: 1.68 m/s
Area-P 1/2: 3.79 cm2
Calc EF: 57.5 %
Height: 69 in
MV VTI: 2.64 cm2
S' Lateral: 3.9 cm
Single Plane A2C EF: 56.8 %
Single Plane A4C EF: 58 %
Weight: 3728.42 oz

## 2021-11-25 LAB — COMPREHENSIVE METABOLIC PANEL
ALT: 20 U/L (ref 0–44)
ALT: 16 U/L (ref 0–44)
AST: 30 U/L (ref 15–41)
AST: 27 U/L (ref 15–41)
Albumin: 3.9 g/dL (ref 3.5–5.0)
Albumin: 3.6 g/dL (ref 3.5–5.0)
Alkaline Phosphatase: 87 U/L (ref 38–126)
Alkaline Phosphatase: 79 U/L (ref 38–126)
Anion gap: 8 (ref 5–15)
Anion gap: 9 (ref 5–15)
BUN: 41 mg/dL — ABNORMAL HIGH (ref 8–23)
BUN: 40 mg/dL — ABNORMAL HIGH (ref 8–23)
CO2: 22 mmol/L (ref 22–32)
CO2: 19 mmol/L — ABNORMAL LOW (ref 22–32)
Calcium: 9 mg/dL (ref 8.9–10.3)
Calcium: 8.6 mg/dL — ABNORMAL LOW (ref 8.9–10.3)
Chloride: 112 mmol/L — ABNORMAL HIGH (ref 98–111)
Chloride: 112 mmol/L — ABNORMAL HIGH (ref 98–111)
Creatinine, Ser: 4.29 mg/dL — ABNORMAL HIGH (ref 0.61–1.24)
Creatinine, Ser: 4.19 mg/dL — ABNORMAL HIGH (ref 0.61–1.24)
GFR, Estimated: 13 mL/min — ABNORMAL LOW (ref >60)
GFR, Estimated: 14 mL/min — ABNORMAL LOW (ref >60)
Glucose, Bld: 61 mg/dL — ABNORMAL LOW (ref 70–99)
Glucose, Bld: 78 mg/dL (ref 70–99)
Potassium: 4 mmol/L (ref 3.5–5.1)
Potassium: 3.7 mmol/L (ref 3.5–5.1)
Sodium: 142 mmol/L (ref 135–145)
Sodium: 140 mmol/L (ref 135–145)
Total Bilirubin: 0.4 mg/dL (ref 0.3–1.2)
Total Bilirubin: 0.5 mg/dL (ref 0.3–1.2)
Total Protein: 8.3 g/dL — ABNORMAL HIGH (ref 6.5–8.1)
Total Protein: 7.7 g/dL (ref 6.5–8.1)

## 2021-11-25 LAB — CBG MONITORING, ED
Glucose-Capillary: 113 mg/dL — ABNORMAL HIGH (ref 70–99)
Glucose-Capillary: 140 mg/dL — ABNORMAL HIGH (ref 70–99)
Glucose-Capillary: 37 mg/dL — CL (ref 70–99)
Glucose-Capillary: 37 mg/dL — CL (ref 70–99)
Glucose-Capillary: 71 mg/dL (ref 70–99)
Glucose-Capillary: 87 mg/dL (ref 70–99)

## 2021-11-25 LAB — URINALYSIS, MICROSCOPIC (REFLEX)
Bacteria, UA: NONE SEEN
Squamous Epithelial / HPF: NONE SEEN (ref 0–5)

## 2021-11-25 LAB — MAGNESIUM: Magnesium: 2.3 mg/dL (ref 1.7–2.4)

## 2021-11-25 LAB — LACTIC ACID, PLASMA
Lactic Acid, Venous: 1.5 mmol/L (ref 0.5–1.9)
Lactic Acid, Venous: 1.4 mmol/L (ref 0.5–1.9)

## 2021-11-25 LAB — PROTIME-INR
INR: 1.1 (ref 0.8–1.2)
Prothrombin Time: 14 seconds (ref 11.4–15.2)

## 2021-11-25 LAB — OCCULT BLOOD X 1 CARD TO LAB, STOOL: Fecal Occult Bld: NEGATIVE

## 2021-11-25 LAB — APTT: aPTT: 30 seconds (ref 24–36)

## 2021-11-25 LAB — PROCALCITONIN: Procalcitonin: <0.1 ng/mL

## 2021-11-25 LAB — MRSA NEXT GEN BY PCR, NASAL: MRSA by PCR Next Gen: NOT DETECTED

## 2021-11-25 LAB — TSH: TSH: 3.239 u[IU]/mL (ref 0.350–4.500)

## 2021-11-25 LAB — SARS CORONAVIRUS 2 BY RT PCR: SARS Coronavirus 2 by RT PCR: NEGATIVE

## 2021-11-25 MED ORDER — DEXTROSE 5 % IV SOLN: Freq: Once | INTRAVENOUS | Status: DC

## 2021-11-25 MED ORDER — CLONIDINE HCL 0.2 MG PO TABS: 0.3000 mg | ORAL_TABLET | Freq: Every day | ORAL | Status: DC

## 2021-11-25 MED ORDER — FUROSEMIDE 10 MG/ML IJ SOLN
40.0000 mg | Freq: Once | INTRAMUSCULAR | Status: AC
  Administered 2021-11-25: 40 mg via INTRAVENOUS
  Filled 2021-11-25: qty 4

## 2021-11-25 MED ORDER — IPRATROPIUM-ALBUTEROL 0.5-2.5 (3) MG/3ML IN SOLN
3.0000 mL | Freq: Four times a day (QID) | RESPIRATORY_TRACT | Status: DC
  Filled 2021-11-25: qty 3

## 2021-11-25 MED ORDER — ACETAMINOPHEN 650 MG RE SUPP: 650.0000 mg | Freq: Four times a day (QID) | RECTAL | Status: DC | PRN

## 2021-11-25 MED ORDER — DEXTROSE 50 % IV SOLN
INTRAVENOUS | Status: AC
  Administered 2021-11-25: 50 mL
  Filled 2021-11-25: qty 50

## 2021-11-25 MED ORDER — AMLODIPINE BESYLATE 5 MG PO TABS
10.0000 mg | ORAL_TABLET | ORAL | Status: DC
  Administered 2021-11-25 – 2021-11-26 (×2): 10 mg via ORAL
  Filled 2021-11-25 (×3): qty 2

## 2021-11-25 MED ORDER — OXYCODONE HCL 5 MG PO TABS: 5.0000 mg | ORAL_TABLET | ORAL | Status: DC | PRN

## 2021-11-25 MED ORDER — FUROSEMIDE 10 MG/ML IJ SOLN
20.0000 mg | Freq: Once | INTRAMUSCULAR | Status: AC
  Administered 2021-11-25: 20 mg via INTRAVENOUS
  Filled 2021-11-25: qty 2

## 2021-11-25 MED ORDER — SIMVASTATIN 20 MG PO TABS
20.0000 mg | ORAL_TABLET | Freq: Every day | ORAL | Status: DC
  Administered 2021-11-25 – 2021-11-27 (×3): 20 mg via ORAL
  Filled 2021-11-25 (×3): qty 1

## 2021-11-25 MED ORDER — CLONIDINE HCL 0.1 MG PO TABS
0.3000 mg | ORAL_TABLET | Freq: Three times a day (TID) | ORAL | Status: DC
  Administered 2021-11-25 – 2021-11-28 (×9): 0.3 mg via ORAL
  Filled 2021-11-25: qty 3
  Filled 2021-11-25: qty 1
  Filled 2021-11-25 (×3): qty 3
  Filled 2021-11-25: qty 1
  Filled 2021-11-25 (×2): qty 3
  Filled 2021-11-25: qty 1

## 2021-11-25 MED ORDER — ONDANSETRON HCL 4 MG PO TABS: 4.0000 mg | ORAL_TABLET | Freq: Four times a day (QID) | ORAL | Status: DC | PRN

## 2021-11-25 MED ORDER — DEXTROSE 5 % IV SOLN: Freq: Once | INTRAVENOUS | Status: AC

## 2021-11-25 MED ORDER — ACETAMINOPHEN 325 MG PO TABS: 650.0000 mg | ORAL_TABLET | Freq: Four times a day (QID) | ORAL | Status: DC | PRN

## 2021-11-25 MED ORDER — DEXTROSE 50 % IV SOLN: 50.0000 mL | Freq: Once | INTRAVENOUS | Status: AC

## 2021-11-25 MED ORDER — SODIUM CHLORIDE 0.9 % IV SOLN
3.0000 g | Freq: Once | INTRAVENOUS | Status: AC
  Administered 2021-11-25: 3 g via INTRAVENOUS
  Filled 2021-11-25: qty 8

## 2021-11-25 MED ORDER — ONDANSETRON HCL 4 MG/2ML IJ SOLN: 4.0000 mg | Freq: Four times a day (QID) | INTRAMUSCULAR | Status: DC | PRN

## 2021-11-25 MED ORDER — IPRATROPIUM-ALBUTEROL 0.5-2.5 (3) MG/3ML IN SOLN
3.0000 mL | Freq: Four times a day (QID) | RESPIRATORY_TRACT | Status: DC | PRN
  Administered 2021-11-25 (×2): 3 mL via RESPIRATORY_TRACT
  Filled 2021-11-25: qty 3

## 2021-11-25 MED ORDER — SODIUM CHLORIDE 0.9 % IV SOLN
3.0000 g | Freq: Two times a day (BID) | INTRAVENOUS | Status: DC
  Administered 2021-11-25 – 2021-11-28 (×6): 3 g via INTRAVENOUS
  Filled 2021-11-25 (×8): qty 8

## 2021-11-25 MED ORDER — DEXTROSE 50 % IV SOLN
INTRAVENOUS | Status: AC
  Administered 2021-11-25: 50 mL via INTRAVENOUS
  Filled 2021-11-25: qty 50

## 2021-11-25 MED ORDER — HEPARIN SODIUM (PORCINE) 5000 UNIT/ML IJ SOLN
5000.0000 [IU] | Freq: Three times a day (TID) | INTRAMUSCULAR | Status: DC
  Administered 2021-11-25 – 2021-11-28 (×10): 5000 [IU] via SUBCUTANEOUS
  Filled 2021-11-25 (×10): qty 1

## 2021-11-25 MED ORDER — DEXTROSE 50 % IV SOLN
INTRAVENOUS | Status: AC
  Filled 2021-11-25: qty 50

## 2021-11-25 MED ORDER — MORPHINE SULFATE (PF) 2 MG/ML IV SOLN: 2.0000 mg | INTRAVENOUS | Status: DC | PRN

## 2021-11-25 MED ORDER — DEXTROSE 10 % IV SOLN: INTRAVENOUS | Status: DC

## 2021-11-25 MED ORDER — HYDRALAZINE HCL 25 MG PO TABS
100.0000 mg | ORAL_TABLET | Freq: Three times a day (TID) | ORAL | Status: DC
  Administered 2021-11-25 – 2021-11-28 (×9): 100 mg via ORAL
  Filled 2021-11-25 (×10): qty 4

## 2021-11-25 MED ORDER — SIMVASTATIN 20 MG PO TABS: 20.0000 mg | ORAL_TABLET | Freq: Every day | ORAL | Status: DC

## 2021-11-25 MED ORDER — HYDRALAZINE HCL 20 MG/ML IJ SOLN
10.0000 mg | Freq: Three times a day (TID) | INTRAMUSCULAR | Status: DC | PRN
  Administered 2021-11-25: 10 mg via INTRAVENOUS
  Filled 2021-11-25: qty 1

## 2021-11-25 MED ORDER — CHLORHEXIDINE GLUCONATE CLOTH 2 % EX PADS
6.0000 | MEDICATED_PAD | Freq: Every day | CUTANEOUS | Status: DC
  Administered 2021-11-26 (×2): 6 via TOPICAL

## 2021-11-25 MED ORDER — CARVEDILOL 12.5 MG PO TABS
12.5000 mg | ORAL_TABLET | Freq: Two times a day (BID) | ORAL | Status: DC
  Administered 2021-11-25 – 2021-11-28 (×7): 12.5 mg via ORAL
  Filled 2021-11-25 (×7): qty 1

## 2021-11-25 MED ORDER — ASPIRIN 81 MG PO TBEC
81.0000 mg | DELAYED_RELEASE_TABLET | Freq: Every day | ORAL | Status: DC
  Administered 2021-11-25 – 2021-11-28 (×4): 81 mg via ORAL
  Filled 2021-11-25 (×4): qty 1

## 2021-11-25 MED ORDER — ORAL CARE MOUTH RINSE: 15.0000 mL | OROMUCOSAL | Status: DC | PRN

## 2021-11-25 NOTE — ED Provider Notes (Signed)
[facility] EMERGENCY DEPARTMENT Provider Note   CSN: [id number] Arrival date & time: 11/25/21  0030     History  Chief Complaint  Patient presents with   Hypoglycemia    Devin [name] is a 76 y.o. male.  Patient brought to the emergency department from home by ambulance.  Patient was watching TV when he became disoriented.  His wife called EMS because of his confusion.  EMS report hypoglycemia that was treated with oral glucose, Pepsi and then D10.  Patient became alert and oriented but seems to be having some difficulty breathing during transport.  At arrival, patient reports that he feels improved.       Home Medications Prior to Admission medications   Medication Sig Start Date End Date Taking? Authorizing Provider  allopurinol (ZYLOPRIM) 300 MG tablet Take 300 mg by mouth 1 day or 1 dose.    [provider]  amLODipine (NORVASC) 10 MG tablet Take 10 mg by mouth 1 day or 1 dose.    [provider]  aspirin EC 81 MG tablet Take 81 mg by mouth daily. Swallow whole.    [provider]  carvedilol (COREG) 12.5 MG tablet Take 12.5 mg by mouth 2 (two) times daily with a meal.    [provider]  cloNIDine (CATAPRES) 0.3 MG tablet Take 0.3 mg by mouth in the morning, at noon, and at bedtime.    [provider]  doxercalciferol (HECTOROL) 0.5 MCG capsule Take 0.5 mcg by mouth 3 (three) times a week.    [provider]  furosemide (LASIX) 40 MG tablet Take 40 mg by mouth daily.    [provider]  GLIPIZIDE XL 10 MG 24 hr tablet Take 10 mg by mouth daily. 10/04/21   [provider]  hydrALAZINE (APRESOLINE) 100 MG tablet Take 100 mg by mouth in the morning, at noon, and at bedtime.    [provider]  simvastatin (ZOCOR) 20 MG tablet Take 20 mg by mouth daily.    [provider]      Allergies    Patient has no known allergies.    Review of Systems   Review of Systems  Physical Exam Updated  Vital Signs BP (!) 151/68   Pulse 79   Temp 97.7 F (36.5 C)   Resp 18   Ht 5\' 9"  (1.753 m)   Wt 107 kg   SpO2 98%   BMI 34.85 kg/m  Physical Exam Vitals and nursing note reviewed.  Constitutional:      General: He is not in acute distress.    Appearance: He is well-developed.  HENT:     Head: Normocephalic and atraumatic.     Mouth/Throat:     Mouth: Mucous membranes are moist.  Eyes:     General: Vision grossly intact. Gaze aligned appropriately.     Extraocular Movements: Extraocular movements intact.     Conjunctiva/sclera: Conjunctivae normal.  Cardiovascular:     Rate and Rhythm: Normal rate and regular rhythm.     Pulses: Normal pulses.     Heart sounds: Normal heart sounds, S1 normal and S2 normal. No murmur heard.    No friction rub. No gallop.  Pulmonary:     Effort: Pulmonary effort is normal. No respiratory distress.     Breath sounds: Normal breath sounds.  Abdominal:     Palpations: Abdomen is soft.     Tenderness: There is no abdominal tenderness. There is no guarding or rebound.  Hernia: No hernia is present.  Musculoskeletal:        General: No swelling.     Cervical back: Full passive range of motion without pain, normal range of motion and neck supple. No pain with movement, spinous process tenderness or muscular tenderness. Normal range of motion.     Right lower leg: No edema.     Left lower leg: No edema.  Skin:    General: Skin is warm and dry.     Capillary Refill: Capillary refill takes less than 2 seconds.     Findings: No ecchymosis, erythema, lesion or wound.  Neurological:     Mental Status: He is alert and oriented to person, place, and time.     GCS: GCS eye subscore is 4. GCS verbal subscore is 5. GCS motor subscore is 6.     Cranial Nerves: Cranial nerves 2-12 are intact.     Sensory: Sensation is intact.     Motor: Motor function is intact. No weakness or abnormal muscle tone.     Coordination: Coordination is intact.   Psychiatric:        Mood and Affect: Mood normal.        Speech: Speech normal.        Behavior: Behavior normal.     ED Results / Procedures / Treatments   Labs (all labs ordered are listed, but only abnormal results are displayed) Labs Reviewed  COMPREHENSIVE METABOLIC PANEL - Abnormal; Notable for the following components:      Result Value   Chloride 112 (*)    Glucose, Bld 61 (*)    BUN 41 (*)    Creatinine, Ser 4.29 (*)    Total Protein 8.3 (*)    GFR, Estimated 13 (*)    All other components within normal limits  CBC WITH DIFFERENTIAL/PLATELET - Abnormal; Notable for the following components:   RBC 3.54 (*)    Hemoglobin 10.6 (*)    HCT 32.5 (*)    RDW 15.7 (*)    Neutro Abs 7.8 (*)    All other components within normal limits  CBG MONITORING, ED - Abnormal; Notable for the following components:   Glucose-Capillary 113 (*)    All other components within normal limits  CBG MONITORING, ED - Abnormal; Notable for the following components:   Glucose-Capillary 37 (*)    All other components within normal limits  CBG MONITORING, ED - Abnormal; Notable for the following components:   Glucose-Capillary 37 (*)    All other components within normal limits  CBG MONITORING, ED - Abnormal; Notable for the following components:   Glucose-Capillary 140 (*)    All other components within normal limits  CULTURE, BLOOD (ROUTINE X 2)  CULTURE, BLOOD (ROUTINE X 2)  URINE CULTURE  SARS CORONAVIRUS 2 BY RT PCR  LACTIC ACID, PLASMA  PROTIME-INR  APTT  LACTIC ACID, PLASMA  URINALYSIS, ROUTINE W REFLEX MICROSCOPIC  CBG MONITORING, ED    EKG EKG Interpretation  Date/Time:  Tuesday November 25 2021 00:34:06 EDT Ventricular Rate:  93 PR Interval:  179 QRS Duration: 116 QT Interval:  377 QTC Calculation: 469 R Axis:   73 Text Interpretation: Sinus rhythm Nonspecific intraventricular conduction delay Borderline low voltage, extremity leads Consider anterior infarct Confirmed  by Orpah Greek [phone]) on 11/25/2021 12:41:57 AM  Radiology DG Chest Port 1 View  Result Date: 11/25/2021 CLINICAL DATA:  Questionable sepsis - evaluate for abnormality EXAM: PORTABLE CHEST 1 VIEW COMPARISON:  None Available. FINDINGS:  Prominent cardiac silhouette likely due to AP portable technique. The heart and mediastinal contours are within normal limits. Bibasilar airspace opacities. No pulmonary edema. Trace right left pleural effusion. No pneumothorax. No acute osseous abnormality. Degenerative changes of the right shoulder. IMPRESSION: Bibasilar airspace opacities and trace right pleural effusion suggestive of infection/inflammation. Followup PA and lateral chest X-ray is recommended in 3-4 weeks following therapy to ensure resolution and exclude underlying malignancy. Electronically Signed   By: Iven Finn M.D.   On: 11/25/2021 01:10    Procedures Procedures    Medications Ordered in ED Medications  Ampicillin-Sulbactam (UNASYN) 3 g in sodium chloride 0.9 % 100 mL IVPB (3 g Intravenous New Bag/Given 11/25/21 0316)  dextrose 50 % solution 50 mL (50 mLs Intravenous Given 11/25/21 0302)  dextrose 5 % solution ( Intravenous New Bag/Given 11/25/21 0302)    ED Course/ Medical Decision Making/ A&P                           Medical Decision Making Amount and/or Complexity of Data Reviewed Labs: ordered. Radiology: ordered. ECG/medicine tests: ordered.  Risk Prescription drug management.   Patient brought to the emergency department from home by ambulance.  Patient had a period of unresponsiveness that prompted his wife to call an ambulance.  Its not clear how long he was altered, she was not in the room with him.  EMS found him to be hypoglycemic, gave him oral and IV sugar with improvement of his mentation.  He was noted, however, at that time to be exhibiting some shortness of breath.  Aspiration was considered possible.  Patient reports that he has been feeling well prior  to that point in time.  No upper respiratory symptoms prior to the event.  Patient has had recurrent hypoglycemia here in the ED requiring additional dextrose treatment.  Chest x-ray with bibasilar infiltrates, will treat empirically for aspiration pneumonia.        Final Clinical Impression(s) / ED Diagnoses Final diagnoses:  Hypoglycemia  Aspiration pneumonia of both lower lobes due to gastric secretions [facility])    Rx / DC Orders ED Discharge Orders     None         Promyse Ardito, Gwenyth Allegra, MD 11/25/21 [phone]

## 2021-11-25 NOTE — TOC Progression Note (Signed)
Transition of Care [facility]) - Progression Note    Patient Details  Name: Tabb Croghan MRN: [id number] Date of Birth: 1945/12/18  Transition of Care [facility]) CM/SW Contact  Salome Arnt, [place] Phone Number: 11/25/2021, 10:45 AM  Clinical Narrative:  Transition of Care [facility]) Screening Note   Patient Details  Name: Jordin Vicencio Date of Birth: 1946-01-24   Transition of Care [facility]) CM/SW Contact:    Salome Arnt, [place] Phone Number: 11/25/2021, 10:45 AM    Transition of Care Department [facility]) has reviewed patient and no TOC needs have been identified at this time. We will continue to monitor patient advancement through interdisciplinary progression rounds. If new patient transition needs arise, please place a TOC consult.          Barriers to Discharge: Continued Medical Work up  Expected Discharge Plan and Services                                                 Social Determinants of Health (SDOH) Interventions    Readmission Risk Interventions     No data to display

## 2021-11-25 NOTE — Assessment & Plan Note (Signed)
-   Concern for fluid overload -No known CHF -Patient does have known CKD and reports there have been talks about putting him on dialysis -Lasix at home daily -One-time dose of 40 mg IV Lasix  -Monitor I's and O's -Monitor renal function and if improving continue diuresis -Continue to monitor

## 2021-11-25 NOTE — Assessment & Plan Note (Signed)
-  episode of unresponsiveness for unknown period of time -Glucose 58 at time of EMS arrival -Patient was given oral glucose and 250 mils of D10 in route to the hospital -On chemistry here glucose was 61, he was treated again and then dropped down to 37, now 1 D5 drip -Most likely hypoglycemia as a cause of this patient's altered mental status -Hypoxia likely also contributing as patient has a new oxygen requirement for 2 L nasal cannula -Patient is now back to baseline with treatment of hypoglycemia and hypoxia

## 2021-11-25 NOTE — Progress Notes (Signed)
Patient seen and examined; admitted after midnight secondary to altered mental status, hypoxia and hypoglycemia.  So far work-up demonstrating suspicious for pneumonia, acute on chronic renal failure and hypoglycemic events leading to altered mental status changes.  Mentation has improved after restarting adequate sugar levels.  Patient with chronic kidney disease stage IV and the use of extended release glipizide.  No chest pain, no nausea, no vomiting, no dysuria/hematuria or focal weaknesses currently.  Please refer to H&P written by Dr. Clearence Ped on 11/25/21 for further info/details on admission.  Patient is hemodynamically stable currently and using 2 L nasal cannula supplementation.  Plan: -Continue current antibiotics and follow culture results. -Start flutter valve and continue as needed bronchodilator management. -Follow-up 2D echo. -Follow renal function trend and electrolytes stability. -Wean off oxygen supplementation as tolerated. -Continue treatment with D5 infusion and follow CBGs. -update A1C and given hx of CKD stage 4 will avoid the use of glipizide at discharge.  Barton Dubois MD [phone]

## 2021-11-25 NOTE — Assessment & Plan Note (Addendum)
-   Possibly due to aspiration given patient had an episode of unresponsiveness - Continue Unasyn - Continue DuoNeb -Patient does have hypoxia, but denies fever, cough -Dyspnea has been ongoing for 1 month per patient - Procalcitonin pending

## 2021-11-25 NOTE — H&P (Signed)
History and Physical    Patient: Devin [name] DXI:[id number] DOB: [birth year] DOA: 11/25/2021 DOS: the patient was seen and examined on 11/25/2021 PCP: Caryl Bis, MD  Patient coming from: Home  Chief Complaint:  Chief Complaint  Patient presents with   Hypoglycemia   HPI: Devin [name] is a 76 y.o. male with medical history significant of CKD stage IV, hypertension, diabetes mellitus type 2, and more presents to the ED with a chief complaint of altered mental status.  Patient reports that he was aware of everything that was going on around him but he could not speak and he could not move his arms and legs.  This was sudden in onset.  It happened while he was watching TV.  He had no pain associated with it.  The only preceding symptom he had was lightheadedness.  When EMS arrived on the site his glucose was 58, he was treated with D10 on the way to the hospital.  In the ER his glucose dropped several more times.  He was treated with amps of D50, and eventually started on D5.  Patient reports that he feels like his mentation is back to baseline. Patient denies any chest pain, fever, cough.  He reports that he is having normal appetite and does not have choking spells during meals.  He is not on any inhalers at home.  He does feel short of breath with exertion.  He reports that this has been going on for approximately 1 month.  It is better with rest.  He does not wear oxygen at home. Patient has no other complaints at this time.  Patient does not smoke, does not drink, does not use illicit drugs.  He is vaccinated for COVID.  Patient is full code. Review of Systems: As mentioned in the history of present illness. All other systems reviewed and are negative. Past Medical History:  Diagnosis Date   Arthritis    CKD (chronic kidney disease) stage 4, GFR 15-29 ml/min (HCC)    Diabetes mellitus without complication ([place])    Gout    Hypertension    Past Surgical History:  Procedure Laterality  Date   KNEE SURGERY     TONSILLECTOMY     Social History:  reports that he quit smoking about 30 years ago. His smoking use included cigarettes. He has been exposed to tobacco smoke. He has never used smokeless tobacco. He reports that he does not drink alcohol and does not use drugs.  No Known Allergies  Family History  Family history unknown: Yes    Prior to Admission medications   Medication Sig Start Date End Date Taking? Authorizing Provider  allopurinol (ZYLOPRIM) 300 MG tablet Take 300 mg by mouth 1 day or 1 dose.    [provider]  amLODipine (NORVASC) 10 MG tablet Take 10 mg by mouth 1 day or 1 dose.    [provider]  aspirin EC 81 MG tablet Take 81 mg by mouth daily. Swallow whole.    [provider]  carvedilol (COREG) 12.5 MG tablet Take 12.5 mg by mouth 2 (two) times daily with a meal.    [provider]  cloNIDine (CATAPRES) 0.3 MG tablet Take 0.3 mg by mouth in the morning, at noon, and at bedtime.    [provider]  doxercalciferol (HECTOROL) 0.5 MCG capsule Take 0.5 mcg by mouth 3 (three) times a week.    [provider]  furosemide (LASIX) 40 MG tablet Take 40 mg by  mouth daily.    [provider]  GLIPIZIDE XL 10 MG 24 hr tablet Take 10 mg by mouth daily. 10/04/21   [provider]  hydrALAZINE (APRESOLINE) 100 MG tablet Take 100 mg by mouth in the morning, at noon, and at bedtime.    [provider]  simvastatin (ZOCOR) 20 MG tablet Take 20 mg by mouth daily.    [provider]    Physical Exam: Vitals:   11/25/21 0200 11/25/21 0230 11/25/21 0330 11/25/21 0400  BP: (!) 169/78 (!) 165/72 (!) 151/68 (!) 167/71  Pulse: 89 82 79 80  Resp: (!) 21 19 18 18   Temp:      SpO2: 98% 97% 98% 98%  Weight:      Height:       1.  General: Patient lying supine in bed,  no acute distress   2. Psychiatric: Alert and oriented x 3, mood and behavior normal for situation, pleasant and  cooperative with exam   3. Neurologic: Speech and language are normal, face is symmetric, moves all 4 extremities voluntarily, at baseline without acute deficits on limited exam   4. HEENMT:  Head is atraumatic, normocephalic, pupils reactive to light, neck is supple, trachea is midline, mucous membranes are moist   5. Respiratory : Lungs are diminished in the lower lung fields, otherwise clear to auscultation bilaterally without wheezing, rhonchi, rales, no cyanosis, no increase in work of breathing or accessory muscle use   6. Cardiovascular : Heart rate normal, rhythm is regular, no murmurs, rubs or gallops, peripheral edema present, peripheral pulses palpated   7. Gastrointestinal:  Abdomen is soft, slightly distended, nontender to palpation bowel sounds active, no masses or organomegaly palpated   8. Skin:  Skin is warm, dry and intact without rashes, acute lesions, or ulcers on limited exam   9.Musculoskeletal:  No acute deformities or trauma, no asymmetry in tone, peripheral edema present, peripheral pulses palpated, no tenderness to palpation in the extremities  Data Reviewed: In the ED Temp 97.7, heart rate 79-90, respiratory rate 10-21, blood pressure 151/68-169/78, satting at 98% on 2 L nasal cannula No leukocytosis, hemoglobin stable at 10.6, platelets 340 Chemistry shows a creatinine that is increased to 4.29 from 3.89-known stage IV CKD Glucose as low as 37 in the ED COVID pending Blood cultures pending Chest x-ray shows bibasilar airspace opacities and trace pleural effusion suggestive of infection versus inflammation Unasyn 3 g started in the ED D5 started at 75 mils per hour Amp of D50 given multiple times prior to admission Admission requested for hypoglycemia and respiratory failure with hypoxia  Assessment and Plan: * Acute respiratory failure with hypoxia ([place]) - Patient now requiring 2 L nasal cannula -No oxygen requirement at baseline -Chest x-ray shows  bibasilar airspace opacities and trace right pleural effusion suggestive of infection/inflammation -Fluid overload likely also contributing -Lasix ordered -Unasyn to cover aspiration pneumonia as there could have been an aspiration event while patient was unresponsive -DuoNeb scheduled ordered -Wean off O2 as tolerated -Continue to monitor  Essential hypertension - Continue Norvasc, Coreg, Catapres   AKI (acute kidney injury) ([place]) - Concern for fluid overload -No known CHF -Patient does have known CKD and reports there have been talks about putting him on dialysis -Lasix at home daily -One-time dose of 40 mg IV Lasix  -Monitor I's and O's -Monitor renal function and if improving continue diuresis -Continue to monitor  CAP (community acquired pneumonia) - Possibly due to aspiration given patient had  an episode of unresponsiveness - Continue Unasyn - Continue DuoNeb -Patient does have hypoxia, but denies fever, cough -Dyspnea has been ongoing for 1 month per patient - Procalcitonin pending  Hypoglycemic coma due to diabetes mellitus ([place]) - Patient is on glipizide at home -Consider removing glipizide at discharge, holding at admission - Every hour glucose - Patient is dropped several times in the ER despite treatment with D50 - Continue D5 - Continue to monitor  Acute metabolic encephalopathy -episode of unresponsiveness for unknown period of time -Glucose 58 at time of EMS arrival -Patient was given oral glucose and 250 mils of D10 in route to the hospital -On chemistry here glucose was 61, he was treated again and then dropped down to 37, now 1 D5 drip -Most likely hypoglycemia as a cause of this patient's altered mental status -Hypoxia likely also contributing as patient has a new oxygen requirement for 2 L nasal cannula -Patient is now back to baseline with treatment of hypoglycemia and hypoxia      Advance Care Planning:   Code Status: Not on file  full  Consults: None  Family Communication: No family at bedside  Severity of Illness: The appropriate patient status for this patient is OBSERVATION. Observation status is judged to be reasonable and necessary in order to provide the required intensity of service to ensure the patient's safety. The patient's presenting symptoms, physical exam findings, and initial radiographic and laboratory data in the context of their medical condition is felt to place them at decreased risk for further clinical deterioration. Furthermore, it is anticipated that the patient will be medically stable for discharge from the hospital within 2 midnights of admission.   Author: Rolla Plate, DO 11/25/2021 4:46 AM  For on call review [email or web address].

## 2021-11-25 NOTE — ED Notes (Signed)
Pt given soda to drink due to blood sugar result of 61.

## 2021-11-25 NOTE — Inpatient Diabetes Management (Signed)
Inpatient Diabetes Program Recommendations  AACE/ADA: New Consensus Statement on Inpatient Glycemic Control   Target Ranges:  Prepandial:   less than 140 mg/dL      Peak postprandial:   less than 180 mg/dL (1-2 hours)      Critically ill patients:  140 - 180 mg/dL    Latest Reference Range & Units 11/25/21 03:13 11/25/21 03:59 11/25/21 04:56 11/25/21 06:17 11/25/21 06:43 11/25/21 07:11 11/25/21 08:21  Glucose-Capillary 70 - 99 mg/dL 140 (H) 71 87 36 (LL) 68 (L) 54 (L) 323 (H)    Latest Reference Range & Units 11/25/21 00:32 11/25/21 02:55 11/25/21 02:56  Glucose-Capillary 70 - 99 mg/dL 113 (H) 37 (LL) 37 (LL)   Review of Glycemic Control  Diabetes history: DM2 Outpatient Diabetes medications: Glipizide XL 10 mg daily Current orders for Inpatient glycemic control: CBGs  Inpatient Diabetes Program Recommendations:    Insulin: Once hypoglycemia has resolved completely, may want to consider ordering CBGs AC&HS with Novolog 0-6 units TID with meals.  Outpatient: Would recommend to discontinue Glipizide outpatient. If patient requires DM medication, would recommend using an oral DM medication with low risk of hypoglycemia, such as Tradjenta 5 mg daily (eliminated via feces).   Thanks, Barnie Alderman, RN, MSN, [place] Diabetes Coordinator Inpatient Diabetes Program [phone] (Team Pager from 8am to [place])

## 2021-11-25 NOTE — Assessment & Plan Note (Signed)
-   Patient now requiring 2 L nasal cannula -No oxygen requirement at baseline -Chest x-ray shows bibasilar airspace opacities and trace right pleural effusion suggestive of infection/inflammation -Fluid overload likely also contributing -Lasix ordered -Unasyn to cover aspiration pneumonia as there could have been an aspiration event while patient was unresponsive -DuoNeb scheduled ordered -Wean off O2 as tolerated -Continue to monitor

## 2021-11-25 NOTE — Progress Notes (Signed)
Hypoglycemic Event  CBG: 63  Treatment: 4 oz juice/soda & eating lunch.  Symptoms: None  Follow-up CBG: Time:1225 CBG Result:59  Possible Reasons for Event: Unknown  Comments/MD notified: Dyann Kief, [place], RN.   **Another amp of D50 given and 4oz of apple juice given.

## 2021-11-25 NOTE — Progress Notes (Signed)
Hypoglycemic Event  CBG: 36   Treatment: 8 oz juice/soda and D50 50 mL (25 gm)  Symptoms: None  Follow-up CBG: [id number]  CBG Result:68   Possible Reasons for Event: Unknown  Comments/MD notified:Dr. A. Zierle-Ghosh     Rudolfo Brandow O Devin [name]

## 2021-11-25 NOTE — Progress Notes (Signed)
*  PRELIMINARY RESULTS* Echocardiogram 2D Echocardiogram has been performed.  Devin [name] 11/25/2021, 9:34 AM

## 2021-11-25 NOTE — Assessment & Plan Note (Signed)
-   Continue Norvasc, Coreg, Catapres

## 2021-11-25 NOTE — Progress Notes (Signed)
Hypoglycemic Event  CBG: 45  Treatment: D50 50 mL (25 gm)  Symptoms: None  Follow-up CBG: Time:1644 CBG Result:107  Possible Reasons for Event: Unknown  Comments/MD notified: Dyann Kief, [place], RN

## 2021-11-25 NOTE — Assessment & Plan Note (Signed)
-   Patient is on glipizide at home -Consider removing glipizide at discharge, holding at admission - Every hour glucose - Patient is dropped several times in the ER despite treatment with D50 - Continue D5 - Continue to monitor

## 2021-11-25 NOTE — Progress Notes (Signed)
Hypoglycemic Event  CBG: 54  Treatment: D50 50 mL (25 gm), pt also going to eat breakfast.   Symptoms: None  Follow-up CBG: Time:0821 CBG Result:323  Possible Reasons for Event: Unknown  Comments/MD notified: Dyann Kief, [place] R Jonavan Vanhorn

## 2021-11-25 NOTE — ED Triage Notes (Signed)
Rcems from home. Cc of AMS and possible stroke. When EMS arrived he was confused . CBG was 58. Pt has 24g of oral glucose and a pepsi, pt also had 257ml D10 via a 20g in the l upper arm. Pt is now alert and oriented.  There was also complaints of shortness of breath. Pt was "belly breathing" per ems and was slightly wheezing.

## 2021-11-26 DIAGNOSIS — J9601 Acute respiratory failure with hypoxia: Secondary | ICD-10-CM | POA: Diagnosis not present

## 2021-11-26 LAB — GLUCOSE, CAPILLARY
Glucose-Capillary: 86 mg/dL (ref 70–99)
Glucose-Capillary: 92 mg/dL (ref 70–99)
Glucose-Capillary: 104 mg/dL — ABNORMAL HIGH (ref 70–99)
Glucose-Capillary: 111 mg/dL — ABNORMAL HIGH (ref 70–99)
Glucose-Capillary: 154 mg/dL — ABNORMAL HIGH (ref 70–99)
Glucose-Capillary: 168 mg/dL — ABNORMAL HIGH (ref 70–99)
Glucose-Capillary: 176 mg/dL — ABNORMAL HIGH (ref 70–99)

## 2021-11-26 LAB — CBC
HCT: 30.8 % — ABNORMAL LOW (ref 39.0–52.0)
Hemoglobin: 10.2 g/dL — ABNORMAL LOW (ref 13.0–17.0)
MCH: 30.4 pg (ref 26.0–34.0)
MCHC: 33.1 g/dL (ref 30.0–36.0)
MCV: 91.9 fL (ref 80.0–100.0)
Platelets: 317 10*3/uL (ref 150–400)
RBC: 3.35 MIL/uL — ABNORMAL LOW (ref 4.22–5.81)
RDW: 15.7 % — ABNORMAL HIGH (ref 11.5–15.5)
WBC: 11.3 10*3/uL — ABNORMAL HIGH (ref 4.0–10.5)
nRBC: 0 % (ref 0.0–0.2)

## 2021-11-26 LAB — BASIC METABOLIC PANEL
Anion gap: 4 — ABNORMAL LOW (ref 5–15)
BUN: 35 mg/dL — ABNORMAL HIGH (ref 8–23)
CO2: 24 mmol/L (ref 22–32)
Calcium: 8.3 mg/dL — ABNORMAL LOW (ref 8.9–10.3)
Chloride: 108 mmol/L (ref 98–111)
Creatinine, Ser: 4.13 mg/dL — ABNORMAL HIGH (ref 0.61–1.24)
GFR, Estimated: 14 mL/min — ABNORMAL LOW (ref >60)
Glucose, Bld: 94 mg/dL (ref 70–99)
Potassium: 3.6 mmol/L (ref 3.5–5.1)
Sodium: 136 mmol/L (ref 135–145)

## 2021-11-26 LAB — URINE CULTURE: Culture: NO GROWTH

## 2021-11-26 MED ORDER — FUROSEMIDE 10 MG/ML IJ SOLN
40.0000 mg | Freq: Once | INTRAMUSCULAR | Status: AC
Start: 2021-11-26 — End: 2021-11-26
  Administered 2021-11-26: 40 mg via INTRAVENOUS
  Filled 2021-11-26: qty 4

## 2021-11-26 MED ORDER — AMLODIPINE BESYLATE 5 MG PO TABS
10.0000 mg | ORAL_TABLET | Freq: Every day | ORAL | Status: DC
  Administered 2021-11-27 – 2021-11-28 (×2): 10 mg via ORAL
  Filled 2021-11-26 (×2): qty 2

## 2021-11-26 MED ORDER — INSULIN ASPART 100 UNIT/ML IJ SOLN
0.0000 [IU] | INTRAMUSCULAR | Status: DC
  Administered 2021-11-26 – 2021-11-28 (×9): 1 [IU] via SUBCUTANEOUS

## 2021-11-26 MED ORDER — HYDRALAZINE HCL 20 MG/ML IJ SOLN
10.0000 mg | Freq: Three times a day (TID) | INTRAMUSCULAR | Status: DC | PRN
  Administered 2021-11-26: 10 mg via INTRAVENOUS
  Filled 2021-11-26: qty 1

## 2021-11-26 NOTE — Progress Notes (Signed)
**Note Devin-Identified via Obfuscation** PROGRESS NOTE    [name]  TDD:[id number] DOB: 23-Feb-1946 DOA: 11/25/2021 PCP: Caryl Bis, MD   Brief Narrative:    Devin [name] is a 76 y.o. male with medical history significant of CKD stage IV, hypertension, diabetes mellitus type 2, and more presents to the ED with a chief complaint of altered mental status.  He was admitted with acute metabolic encephalopathy in the setting of hypoglycemia in the setting of worsening chronic kidney disease.  He is also noted to have acute hypoxemic respiratory failure with suspicion of aspiration pneumonia.  Assessment & Plan:   Principal Problem:   Acute respiratory failure with hypoxia (HCC) Active Problems:   Acute metabolic encephalopathy   Hypoglycemic coma due to diabetes mellitus (Devin [name])   CAP (community acquired pneumonia)   AKI (acute kidney injury) (Devin [name])   Essential hypertension  Assessment and Plan:  Acute respiratory failure with hypoxia (Devin [name]) - Patient now requiring 2 L nasal cannula; wean as tolerated -No oxygen requirement at baseline -Chest x-ray shows bibasilar airspace opacities and trace right pleural effusion suggestive of infection/inflammation -Fluid overload likely also contributing -Lasix ordered -Unasyn to cover aspiration pneumonia as there could have been an aspiration event while patient was unresponsive -DuoNeb scheduled ordered -Wean off O2 as tolerated -Continue to monitor -Bronchodilators ordered   Essential hypertension-stable - Continue Norvasc, Coreg, Catapres     AKI (acute kidney injury) on CKD stage IV - Concern for fluid overload -No urgent need for hemodialysis at this time, nonoliguric -No known CHF -Patient does have known CKD and reports there have been talks about putting him on dialysis -Lasix at home daily -Repeat 40 mg IV Lasix today -Monitor I's and O's -Monitor renal function and if improving continue diuresis -Continue to monitor   CAP (community acquired pneumonia) -  Possibly due to aspiration given patient had an episode of unresponsiveness - Continue Unasyn - Continue DuoNeb -Patient does have hypoxia, but denies fever, cough -Dyspnea has been ongoing for 1 month per patient - Procalcitonin is low   Hypoglycemic coma due to diabetes mellitus (Devin [name]) - Patient is on glipizide at home - Plan to DC glipizide on discharge and consider Tradjenta - Every 4 hour glucose monitoring - Continue D10 IV fluid until appetite further improves - Continue to monitor -Okay for transfer to telemetry   Acute metabolic encephalopathy-improving -Likely due to hypoglycemia -Patient was given oral glucose and 250 mils of D10 in route to the hospital -On chemistry here glucose was 61, he was treated again and then dropped down to 37, now 1 D5 drip -Most likely hypoglycemia as a cause of this patient's altered mental status -Hypoxia likely also contributing as patient has a new oxygen requirement for 2 L nasal cannula -Patient is now back to baseline with treatment of hypoglycemia and hypoxia  Obesity -BMI 34.41 -Lifestyle changes outpatient   DVT prophylaxis: Heparin Code Status: Full Family Communication: Daughter at bedside 9/6 Disposition Plan:  Status is: Inpatient Remains inpatient appropriate because: Need for IV medications and close monitoring.  Consultants:  None  Procedures:  None  Antimicrobials:  Anti-infectives (From admission, onward)    Start     Dose/Rate Route Frequency Ordered Stop   11/25/21 1500  Ampicillin-Sulbactam (UNASYN) 3 g in sodium chloride 0.9 % 100 mL IVPB        3 g 200 mL/hr over 30 Minutes Intravenous Every 12 hours 11/25/21 0510     11/25/21 0315  Ampicillin-Sulbactam (UNASYN) 3 g in sodium chloride 0.9 %  100 mL IVPB        3 g 200 mL/hr over 30 Minutes Intravenous  Once 11/25/21 0301 11/25/21 0403       Subjective: Patient seen and evaluated today with no new acute complaints or concerns. No acute concerns or  events noted overnight.  Near baseline mentation per daughter at bedside.  Reports poor to little appetite.  Objective: Vitals:   11/25/21 2356 11/26/21 0000 11/26/21 0700 11/26/21 0800  BP:  (!) 176/60 (!) 172/52 (!) 161/59  Pulse:  71 72 70  Resp:  (!) 22 (!) 24 18  Temp: 99.6 F (37.6 C)     TempSrc: Oral     SpO2:  98% 95% 97%  Weight:      Height:        Intake/Output Summary (Last 24 hours) at 11/26/2021 0931 Last data filed at 11/26/2021 0306 Gross per 24 hour  Intake 1445.06 ml  Output 1300 ml  Net 145.06 ml   Filed Weights   11/25/21 0035 11/25/21 0541  Weight: 107 kg 105.7 kg    Examination:  General exam: Appears calm and comfortable  Respiratory system: Clear to auscultation. Respiratory effort normal.  On 2 L nasal cannula oxygen. Cardiovascular system: S1 & S2 heard, RRR.  Gastrointestinal system: Abdomen is soft Central nervous system: Alert and awake Extremities: No edema Skin: No significant lesions noted Psychiatry: Flat affect.    Data Reviewed: I have personally reviewed following labs and imaging studies  CBC: Recent Labs  Lab 11/25/21 0157 11/25/21 0426 11/26/21 0418  WBC 9.4 9.1 11.3*  NEUTROABS 7.8* 6.8  --   HGB 10.6* 10.4* 10.2*  HCT 32.5* 32.8* 30.8*  MCV 91.8 95.1 91.9  PLT 340 317 357   Basic Metabolic Panel: Recent Labs  Lab 11/25/21 0157 11/25/21 0426 11/26/21 0418  NA 142 140 136  K 4.0 3.7 3.6  CL 112* 112* 108  CO2 22 19* 24  GLUCOSE 61* 78 94  BUN 41* 40* 35*  CREATININE 4.29* 4.19* 4.13*  CALCIUM 9.0 8.6* 8.3*  MG  --  2.3  --    GFR: Estimated Creatinine Clearance: 17.7 mL/min (A) (by C-G formula based on SCr of 4.13 mg/dL (H)). Liver Function Tests: Recent Labs  Lab 11/25/21 0157 11/25/21 0426  AST 30 27  ALT 20 16  ALKPHOS 87 79  BILITOT 0.4 0.5  PROT 8.3* 7.7  ALBUMIN 3.9 3.6   No results for input(s): "LIPASE", "AMYLASE" in the last 168 hours. No results for input(s): "AMMONIA" in the last 168  hours. Coagulation Profile: Recent Labs  Lab 11/25/21 0157  INR 1.1   Cardiac Enzymes: No results for input(s): "CKTOTAL", "CKMB", "CKMBINDEX", "TROPONINI" in the last 168 hours. BNP (last 3 results) No results for input(s): "PROBNP" in the last 8760 hours. HbA1C: Recent Labs    11/25/21 0426  HGBA1C 6.0*   CBG: Recent Labs  Lab 11/25/21 2354 11/26/21 0200 11/26/21 0404 11/26/21 0550 11/26/21 0739  GLUCAP 80 86 92 104* 111*   Lipid Profile: No results for input(s): "CHOL", "HDL", "LDLCALC", "TRIG", "CHOLHDL", "LDLDIRECT" in the last 72 hours. Thyroid Function Tests: Recent Labs    11/25/21 0151  TSH 3.239   Anemia Panel: No results for input(s): "VITAMINB12", "FOLATE", "FERRITIN", "TIBC", "IRON", "RETICCTPCT" in the last 72 hours. Sepsis Labs: Recent Labs  Lab 11/25/21 0157 11/25/21 0400 11/25/21 0426  PROCALCITON  --  <0.10  --   LATICACIDVEN 1.5  --  1.4    Recent  Results (from the past 240 hour(s))  Blood Culture (routine x 2)     Status: None (Preliminary result)   Collection Time: 11/25/21  1:54 AM   Specimen: BLOOD  Result Value Ref Range Status   Specimen Description BLOOD RIGHT ANTECUBITAL  Final   Special Requests   Final    BOTTLES DRAWN AEROBIC AND ANAEROBIC Blood Culture adequate volume   Culture   Final    NO GROWTH 1 DAY Performed at [facility], [address], [place], [place] [address]    Report Status PENDING  Incomplete  Blood Culture (routine x 2)     Status: None (Preliminary result)   Collection Time: 11/25/21  1:57 AM   Specimen: BLOOD  Result Value Ref Range Status   Specimen Description BLOOD LEFT ANTECUBITAL  Final   Special Requests   Final    BOTTLES DRAWN AEROBIC AND ANAEROBIC Blood Culture adequate volume   Culture   Final    NO GROWTH 1 DAY Performed at [facility], [address], [place], [place] [address]    Report Status PENDING  Incomplete  SARS Coronavirus 2 by RT PCR (hospital order, performed in [facility]  hospital lab) *cepheid single result test* Anterior Nasal Swab     Status: None   Collection Time: 11/25/21  2:52 AM   Specimen: Anterior Nasal Swab  Result Value Ref Range Status   SARS Coronavirus 2 by RT PCR NEGATIVE NEGATIVE Final    Comment: (NOTE) SARS-CoV-2 target nucleic acids are NOT DETECTED.  The SARS-CoV-2 RNA is generally detectable in upper and lower respiratory specimens during the acute phase of infection. The lowest concentration of SARS-CoV-2 viral copies this assay can detect is 250 copies / mL. A negative result does not preclude SARS-CoV-2 infection and should not be used as the sole basis for treatment or other patient management decisions.  A negative result may occur with improper specimen collection / handling, submission of specimen other than nasopharyngeal swab, presence of viral mutation(s) within the areas targeted by this assay, and inadequate number of viral copies (<250 copies / mL). A negative result must be combined with clinical observations, patient history, and epidemiological information.  Fact Sheet for Patients:   https://www.patel.info/  Fact Sheet for Healthcare Providers: https://hall.com/  This test is not yet approved or  cleared by the [place] FDA and has been authorized for detection and/or diagnosis of SARS-CoV-2 by FDA under an Emergency Use Authorization (EUA).  This EUA will remain in effect (meaning this test can be used) for the duration of the COVID-19 declaration under Section 564(b)(1) of the Act, 21 U.S.C. section 360bbb-3(b)(1), unless the authorization is terminated or revoked sooner.  Performed at [facility], [address] [place], [place] [address]   MRSA Next Gen by PCR, Nasal     Status: None   Collection Time: 11/25/21  5:12 AM   Specimen: Nasal Mucosa; Nasal Swab  Result Value Ref Range Status   MRSA by PCR Next Gen NOT DETECTED NOT DETECTED Final    Comment:  (NOTE) The GeneXpert MRSA Assay (FDA approved for NASAL specimens only), is one component of a comprehensive MRSA colonization surveillance program. It is not intended to diagnose MRSA infection nor to guide or monitor treatment for MRSA infections. Test performance is not FDA approved in patients less than 78 years old. Performed at [facility], [address], [place], [place] [address]          Radiology Studies: ECHOCARDIOGRAM COMPLETE  Result Date: 11/25/2021    ECHOCARDIOGRAM REPORT   Patient Name:   PERSHING SKIDMORE Date of Exam: 11/25/2021 Medical Rec #:  [id number]     Height:       69.0 in Accession #:    [id number]    Weight:       233.0 lb Date of Birth:  1945/10/17     BSA:          2.205 m Patient Age:    11 years      BP:           179/73 mmHg Patient Gender: M             HR:           82 bpm. Exam Location:  [facility] Procedure: 2D Echo, Cardiac Doppler and Color Doppler Indications:    Dyspnea  History:        Patient has no prior history of Echocardiogram examinations.                 Risk Factors:Hypertension and Diabetes.  Sonographer:    Wenda Low Referring Phys: [id number] ASIA B Sel[place]  1. Left ventricular ejection fraction, by estimation, is 55 to 60%. The left ventricle has normal function. The left ventricle has no regional wall motion abnormalities. There is mild concentric left ventricular hypertrophy. Left ventricular diastolic parameters are consistent with Grade II diastolic dysfunction (pseudonormalization).  2. Right ventricular systolic function is normal. The right ventricular size is mildly enlarged. There is moderately elevated pulmonary artery systolic pressure. The estimated right ventricular systolic pressure is 49.8 mmHg.  3. Left atrial size was severely dilated.  4. The mitral valve is abnormal. No evidence of mitral valve regurgitation. No evidence of mitral stenosis.  5. The aortic valve is tricuspid. There is moderate calcification of  the aortic valve. Aortic valve regurgitation is not visualized. Aortic valve sclerosis/calcification is present, without any evidence of aortic stenosis.  6. The inferior vena cava is dilated in size with >50% respiratory variability, suggesting right atrial pressure of 8 mmHg. Comparison(s): No prior Echocardiogram. FINDINGS  Left Ventricle: Short axis assessment of hypertrophy. Left ventricular ejection fraction, by estimation, is 55 to 60%. The left ventricle has normal function. The left ventricle has no regional wall motion abnormalities. The left ventricular internal cavity size was normal in size. There is mild concentric left ventricular hypertrophy. Left ventricular diastolic parameters are consistent with Grade II diastolic dysfunction (pseudonormalization). Right Ventricle: The right ventricular size is mildly enlarged. No increase in right ventricular wall thickness. Right ventricular systolic function is normal. There is moderately elevated pulmonary artery systolic pressure. The tricuspid regurgitant velocity is 3.22 m/s, and with an assumed right atrial pressure of 8 mmHg, the estimated right ventricular systolic pressure is 26.4 mmHg. Left Atrium: Left atrial size was severely dilated. Right Atrium: Right atrial size was normal in size. Pericardium: There is no evidence of pericardial effusion. Mitral Valve: 2D MVA 4.58 cm2, 2.64 cm2 by contiuity. The mitral valve is abnormal. No evidence of mitral valve regurgitation. No evidence of mitral valve stenosis. MV peak gradient, 7.4 mmHg. The mean mitral valve gradient is 3.0 mmHg. Tricuspid Valve: The tricuspid valve is normal in structure. Tricuspid valve regurgitation is not demonstrated. No evidence of tricuspid stenosis. Aortic Valve: The aortic valve is tricuspid. There is moderate calcification of the aortic valve. Aortic valve regurgitation is not visualized. Aortic valve sclerosis/calcification is present, without any evidence of aortic stenosis.  Aortic valve  mean gradient measures 7.0 mmHg. Aortic valve peak gradient measures 11.3 mmHg. Aortic valve area, by VTI measures 2.58 cm. Pulmonic Valve: The pulmonic valve was normal in structure. Pulmonic valve regurgitation is not visualized. No evidence of pulmonic stenosis. Aorta: The aortic root is normal in size and structure. Venous: The inferior vena cava is dilated in size with greater than 50% respiratory variability, suggesting right atrial pressure of 8 mmHg. IAS/Shunts: No atrial level shunt detected by color flow Doppler.  LEFT VENTRICLE PLAX 2D LVIDd:         5.40 cm      Diastology LVIDs:         3.90 cm      LV e' medial:    4.46 cm/s LV PW:         1.20 cm      LV E/e' medial:  25.6 LV IVS:        1.50 cm      LV e' lateral:   5.59 cm/s LVOT diam:     2.00 cm      LV E/e' lateral: 20.4 LV SV:         94 LV SV Index:   43 LVOT Area:     3.14 cm  LV Volumes (MOD) LV vol d, MOD A2C: 135.0 ml LV vol d, MOD A4C: 104.0 ml LV vol s, MOD A2C: 58.3 ml LV vol s, MOD A4C: 43.7 ml LV SV MOD A2C:     76.7 ml LV SV MOD A4C:     104.0 ml LV SV MOD BP:      69.7 ml RIGHT VENTRICLE RV Basal diam:  4.00 cm RV Mid diam:    3.40 cm RV S prime:     17.00 cm/s TAPSE (M-mode): 2.9 cm LEFT ATRIUM              Index        RIGHT ATRIUM           Index LA diam:        5.40 cm  2.45 cm/m   RA Area:     20.00 cm LA Vol (A2C):   128.0 ml 58.06 ml/m  RA Volume:   57.70 ml  26.17 ml/m LA Vol (A4C):   152.0 ml 68.95 ml/m LA Biplane Vol: 142.0 ml 64.41 ml/m  AORTIC VALVE                     PULMONIC VALVE AV Area (Vmax):    2.37 cm      PV Vmax:       0.84 m/s AV Area (Vmean):   2.48 cm      PV Peak grad:  2.8 mmHg AV Area (VTI):     2.58 cm AV Vmax:           168.00 cm/s AV Vmean:          120.000 cm/s AV VTI:            0.364 m AV Peak Grad:      11.3 mmHg AV Mean Grad:      7.0 mmHg LVOT Vmax:         127.00 cm/s LVOT Vmean:        94.600 cm/s LVOT VTI:          0.299 m LVOT/AV VTI ratio: 0.82  AORTA Ao Root diam:  3.20 cm Ao Asc diam:  3.10 cm MITRAL VALVE  TRICUSPID VALVE MV Area (PHT): 3.79 cm     TR Peak grad:   41.5 mmHg MV Area VTI:   2.64 cm     TR Vmax:        322.00 cm/s MV Peak grad:  7.4 mmHg MV Mean grad:  3.0 mmHg     SHUNTS MV Vmax:       1.36 m/s     Systemic VTI:  0.30 m MV Vmean:      83.6 cm/s    Systemic Diam: 2.00 cm MV Decel Time: 200 msec MV E velocity: 114.00 cm/s MV A velocity: 99.80 cm/s MV E/A ratio:  1.14 Rudean Haskell MD Electronically signed by Rudean Haskell MD Signature Date/Time: 11/25/2021/10:43:47 AM    Final    DG Chest Port 1 View  Result Date: 11/25/2021 CLINICAL DATA:  Questionable sepsis - evaluate for abnormality EXAM: PORTABLE CHEST 1 VIEW COMPARISON:  None Available. FINDINGS: Prominent cardiac silhouette likely due to AP portable technique. The heart and mediastinal contours are within normal limits. Bibasilar airspace opacities. No pulmonary edema. Trace right left pleural effusion. No pneumothorax. No acute osseous abnormality. Degenerative changes of the right shoulder. IMPRESSION: Bibasilar airspace opacities and trace right pleural effusion suggestive of infection/inflammation. Followup PA and lateral chest X-ray is recommended in 3-4 weeks following therapy to ensure resolution and exclude underlying malignancy. Electronically Signed   By: Iven Finn M.D.   On: 11/25/2021 01:10        Scheduled Meds:  amLODipine  10 mg Oral 1 day or 1 dose   aspirin EC  81 mg Oral Daily   carvedilol  12.5 mg Oral BID WC   Chlorhexidine Gluconate Cloth  6 each Topical Daily   cloNIDine  0.3 mg Oral TID   heparin  5,000 Units Subcutaneous Q8H   hydrALAZINE  100 mg Oral Q8H   insulin aspart  0-6 Units Subcutaneous Q4H   simvastatin  20 mg Oral Daily   Continuous Infusions:  ampicillin-sulbactam (UNASYN) IV Stopped (11/26/21 0259)   dextrose 50 mL/hr at 11/26/21 0306     LOS: 1 day    Time spent: 35 minutes    Trust Leh D Manuella Ghazi, DO [facility]  If 7PM-7AM, please contact night-coverage [email or web address] 11/26/2021, 9:31 AM

## 2021-11-27 DIAGNOSIS — J9601 Acute respiratory failure with hypoxia: Secondary | ICD-10-CM | POA: Diagnosis not present

## 2021-11-27 LAB — BASIC METABOLIC PANEL
Anion gap: 7 (ref 5–15)
BUN: 41 mg/dL — ABNORMAL HIGH (ref 8–23)
CO2: 21 mmol/L — ABNORMAL LOW (ref 22–32)
Calcium: 8.2 mg/dL — ABNORMAL LOW (ref 8.9–10.3)
Chloride: 107 mmol/L (ref 98–111)
Creatinine, Ser: 4.45 mg/dL — ABNORMAL HIGH (ref 0.61–1.24)
GFR, Estimated: 13 mL/min — ABNORMAL LOW (ref >60)
Glucose, Bld: 168 mg/dL — ABNORMAL HIGH (ref 70–99)
Potassium: 4 mmol/L (ref 3.5–5.1)
Sodium: 135 mmol/L (ref 135–145)

## 2021-11-27 LAB — CBC
HCT: 28.4 % — ABNORMAL LOW (ref 39.0–52.0)
Hemoglobin: 9.3 g/dL — ABNORMAL LOW (ref 13.0–17.0)
MCH: 30.4 pg (ref 26.0–34.0)
MCHC: 32.7 g/dL (ref 30.0–36.0)
MCV: 92.8 fL (ref 80.0–100.0)
Platelets: 258 10*3/uL (ref 150–400)
RBC: 3.06 MIL/uL — ABNORMAL LOW (ref 4.22–5.81)
RDW: 15.5 % (ref 11.5–15.5)
WBC: 8.8 10*3/uL (ref 4.0–10.5)
nRBC: 0 % (ref 0.0–0.2)

## 2021-11-27 LAB — GLUCOSE, CAPILLARY
Glucose-Capillary: 145 mg/dL — ABNORMAL HIGH (ref 70–99)
Glucose-Capillary: 161 mg/dL — ABNORMAL HIGH (ref 70–99)
Glucose-Capillary: 165 mg/dL — ABNORMAL HIGH (ref 70–99)
Glucose-Capillary: 174 mg/dL — ABNORMAL HIGH (ref 70–99)
Glucose-Capillary: 183 mg/dL — ABNORMAL HIGH (ref 70–99)
Glucose-Capillary: 183 mg/dL — ABNORMAL HIGH (ref 70–99)
Glucose-Capillary: 192 mg/dL — ABNORMAL HIGH (ref 70–99)

## 2021-11-27 LAB — MAGNESIUM: Magnesium: 2.1 mg/dL (ref 1.7–2.4)

## 2021-11-27 MED ORDER — LINAGLIPTIN 5 MG PO TABS
5.0000 mg | ORAL_TABLET | Freq: Every day | ORAL | Status: DC
  Administered 2021-11-27 – 2021-11-28 (×2): 5 mg via ORAL
  Filled 2021-11-27 (×2): qty 1

## 2021-11-27 MED ORDER — SODIUM BICARBONATE 650 MG PO TABS
650.0000 mg | ORAL_TABLET | Freq: Two times a day (BID) | ORAL | Status: DC
  Administered 2021-11-27 – 2021-11-28 (×3): 650 mg via ORAL
  Filled 2021-11-27 (×3): qty 1

## 2021-11-27 NOTE — Progress Notes (Signed)
PROGRESS NOTE    Devin [name]  UTM:[id number] DOB: Oct 19, 1945 DOA: 11/25/2021 PCP: Caryl Bis, MD   Brief Narrative:    Devin [name] is a 76 y.o. male with medical history significant of CKD stage IV, hypertension, diabetes mellitus type 2, and more presents to the ED with a chief complaint of altered mental status.  He was admitted with acute metabolic encephalopathy in the setting of hypoglycemia in the setting of worsening chronic kidney disease.  He is also noted to have acute hypoxemic respiratory failure with suspicion of aspiration pneumonia.  Assessment & Plan:   Principal Problem:   Acute respiratory failure with hypoxia (HCC) Active Problems:   Acute metabolic encephalopathy   Hypoglycemic coma due to diabetes mellitus ([place])   CAP (community acquired pneumonia)   AKI (acute kidney injury) ([place])   Essential hypertension  Assessment and Plan:   Acute respiratory failure with hypoxia (HCC)-resolved - Weaned to room air at this point -No oxygen requirement at baseline -Chest x-ray shows bibasilar airspace opacities and trace right pleural effusion suggestive of infection/inflammation -Fluid overload likely also contributing -Lasix ordered -Unasyn to cover aspiration pneumonia as there could have been an aspiration event while patient was unresponsive -DuoNeb scheduled ordered -Continue to monitor -Bronchodilators ordered   Essential hypertension-stable - Continue Norvasc, Coreg, Catapres     AKI (acute kidney injury) on CKD stage IV - Concern for fluid overload -No urgent need for hemodialysis at this time, nonoliguric -No known CHF -Patient does have known CKD and reports there have been talks about putting him on dialysis -Lasix at home daily, held for now -Monitor I's and O's -Monitor renal function and if improving continue diuresis -Continue to monitor   CAP (community acquired pneumonia) - Possibly due to aspiration given patient had an episode of  unresponsiveness - Continue Unasyn - Continue DuoNeb -Patient does have hypoxia, but denies fever, cough, no further need for oxygen -Dyspnea has been ongoing for 1 month per patient - Procalcitonin is low   Hypoglycemic coma due to diabetes mellitus ([place]) - Patient is on glipizide at home - Plan to DC glipizide on discharge and startTradjenta 9/7 - Every 4 hour glucose monitoring - Hold further D10 IV fluid today and monitor blood glucose levels while on Tradjenta - Continue to monitor   Acute metabolic encephalopathy-resolved -Likely due to hypoglycemia -Patient was given oral glucose and 250 mils of D10 in route to the hospital -On chemistry here glucose was 61, he was treated again and then dropped down to 37, now 1 D5 drip -Most likely hypoglycemia as a cause of this patient's altered mental status -Hypoxia likely also contributing as patient has a new oxygen requirement for 2 L nasal cannula -Patient is now back to baseline with treatment of hypoglycemia and hypoxia   Obesity -BMI 34.41 -Lifestyle changes outpatient    DVT prophylaxis: Heparin Code Status: Full Family Communication: Daughter at bedside 9/6 Disposition Plan:  Status is: Inpatient Remains inpatient appropriate because: Need for IV medications and close monitoring.   Consultants:  None   Procedures:  None   Antimicrobials:  Anti-infectives (From admission, onward)    Start     Dose/Rate Route Frequency Ordered Stop   11/25/21 1500  Ampicillin-Sulbactam (UNASYN) 3 g in sodium chloride 0.9 % 100 mL IVPB        3 g 200 mL/hr over 30 Minutes Intravenous Every 12 hours 11/25/21 0510     11/25/21 0315  Ampicillin-Sulbactam (UNASYN) 3 g in sodium chloride  0.9 % 100 mL IVPB        3 g 200 mL/hr over 30 Minutes Intravenous  Once 11/25/21 0301 11/25/21 0403       Subjective: Patient seen and evaluated today with no new acute complaints or concerns. No acute concerns or events noted  overnight.  Objective: Vitals:   11/27/21 0538 11/27/21 0539 11/27/21 0854 11/27/21 0900  BP: (!) 150/69 (!) 150/69 (!) 161/59 (!) 161/59  Pulse:  67 68 71  Resp:  18 20 20   Temp:  99.6 F (37.6 C) 98.1 F (36.7 C) 98.1 F (36.7 C)  TempSrc:  Oral    SpO2:  98% 97% 97%  Weight:      Height:        Intake/Output Summary (Last 24 hours) at 11/27/2021 1055 Last data filed at 11/27/2021 0900 Gross per 24 hour  Intake 2364.83 ml  Output 1500 ml  Net 864.83 ml   Filed Weights   11/25/21 0035 11/25/21 0541  Weight: 107 kg 105.7 kg    Examination:  General exam: Appears calm and comfortable  Respiratory system: Clear to auscultation. Respiratory effort normal. Cardiovascular system: S1 & S2 heard, RRR.  Gastrointestinal system: Abdomen is soft Central nervous system: Alert and awake Extremities: No edema Skin: No significant lesions noted Psychiatry: Flat affect.    Data Reviewed: I have personally reviewed following labs and imaging studies  CBC: Recent Labs  Lab 11/25/21 0157 11/25/21 0426 11/26/21 0418 11/27/21 0433  WBC 9.4 9.1 11.3* 8.8  NEUTROABS 7.8* 6.8  --   --   HGB 10.6* 10.4* 10.2* 9.3*  HCT 32.5* 32.8* 30.8* 28.4*  MCV 91.8 95.1 91.9 92.8  PLT 340 317 317 157   Basic Metabolic Panel: Recent Labs  Lab 11/25/21 0157 11/25/21 0426 11/26/21 0418 11/27/21 0433  NA 142 140 136 135  K 4.0 3.7 3.6 4.0  CL 112* 112* 108 107  CO2 22 19* 24 21*  GLUCOSE 61* 78 94 168*  BUN 41* 40* 35* 41*  CREATININE 4.29* 4.19* 4.13* 4.45*  CALCIUM 9.0 8.6* 8.3* 8.2*  MG  --  2.3  --  2.1   GFR: Estimated Creatinine Clearance: 16.4 mL/min (A) (by C-G formula based on SCr of 4.45 mg/dL (H)). Liver Function Tests: Recent Labs  Lab 11/25/21 0157 11/25/21 0426  AST 30 27  ALT 20 16  ALKPHOS 87 79  BILITOT 0.4 0.5  PROT 8.3* 7.7  ALBUMIN 3.9 3.6   No results for input(s): "LIPASE", "AMYLASE" in the last 168 hours. No results for input(s): "AMMONIA" in the  last 168 hours. Coagulation Profile: Recent Labs  Lab 11/25/21 0157  INR 1.1   Cardiac Enzymes: No results for input(s): "CKTOTAL", "CKMB", "CKMBINDEX", "TROPONINI" in the last 168 hours. BNP (last 3 results) No results for input(s): "PROBNP" in the last 8760 hours. HbA1C: Recent Labs    11/25/21 0426  HGBA1C 6.0*   CBG: Recent Labs  Lab 11/26/21 1139 11/26/21 1612 11/26/21 2027 11/27/21 0007 11/27/21 0406  GLUCAP 154* 168* 176* 165* 161*   Lipid Profile: No results for input(s): "CHOL", "HDL", "LDLCALC", "TRIG", "CHOLHDL", "LDLDIRECT" in the last 72 hours. Thyroid Function Tests: Recent Labs    11/25/21 0151  TSH 3.239   Anemia Panel: No results for input(s): "VITAMINB12", "FOLATE", "FERRITIN", "TIBC", "IRON", "RETICCTPCT" in the last 72 hours. Sepsis Labs: Recent Labs  Lab 11/25/21 0157 11/25/21 0400 11/25/21 0426  PROCALCITON  --  <0.10  --   LATICACIDVEN 1.5  --  1.4    Recent Results (from the past 240 hour(s))  Blood Culture (routine x 2)     Status: None (Preliminary result)   Collection Time: 11/25/21  1:54 AM   Specimen: BLOOD  Result Value Ref Range Status   Specimen Description BLOOD RIGHT ANTECUBITAL  Final   Special Requests   Final    BOTTLES DRAWN AEROBIC AND ANAEROBIC Blood Culture adequate volume   Culture   Final    NO GROWTH 2 DAYS Performed at [facility], [address], [place], [place] [address]    Report Status PENDING  Incomplete  Blood Culture (routine x 2)     Status: None (Preliminary result)   Collection Time: 11/25/21  1:57 AM   Specimen: BLOOD  Result Value Ref Range Status   Specimen Description BLOOD LEFT ANTECUBITAL  Final   Special Requests   Final    BOTTLES DRAWN AEROBIC AND ANAEROBIC Blood Culture adequate volume   Culture   Final    NO GROWTH 2 DAYS Performed at [facility], [address], [place], [place] [address]    Report Status PENDING  Incomplete  SARS Coronavirus 2 by RT PCR (hospital order,  performed in [facility] hospital lab) *cepheid single result test* Anterior Nasal Swab     Status: None   Collection Time: 11/25/21  2:52 AM   Specimen: Anterior Nasal Swab  Result Value Ref Range Status   SARS Coronavirus 2 by RT PCR NEGATIVE NEGATIVE Final    Comment: (NOTE) SARS-CoV-2 target nucleic acids are NOT DETECTED.  The SARS-CoV-2 RNA is generally detectable in upper and lower respiratory specimens during the acute phase of infection. The lowest concentration of SARS-CoV-2 viral copies this assay can detect is 250 copies / mL. A negative result does not preclude SARS-CoV-2 infection and should not be used as the sole basis for treatment or other patient management decisions.  A negative result may occur with improper specimen collection / handling, submission of specimen other than nasopharyngeal swab, presence of viral mutation(s) within the areas targeted by this assay, and inadequate number of viral copies (<250 copies / mL). A negative result must be combined with clinical observations, patient history, and epidemiological information.  Fact Sheet for Patients:   https://www.patel.info/  Fact Sheet for Healthcare Providers: https://hall.com/  This test is not yet approved or  cleared by the [place] FDA and has been authorized for detection and/or diagnosis of SARS-CoV-2 by FDA under an Emergency Use Authorization (EUA).  This EUA will remain in effect (meaning this test can be used) for the duration of the COVID-19 declaration under Section 564(b)(1) of the Act, 21 U.S.C. section 360bbb-3(b)(1), unless the authorization is terminated or revoked sooner.  Performed at [facility], [address] [place], [place] [address]   MRSA Next Gen by PCR, Nasal     Status: None   Collection Time: 11/25/21  5:12 AM   Specimen: Nasal Mucosa; Nasal Swab  Result Value Ref Range Status   MRSA by PCR Next Gen NOT DETECTED NOT  DETECTED Final    Comment: (NOTE) The GeneXpert MRSA Assay (FDA approved for NASAL specimens only), is one component of a comprehensive MRSA colonization surveillance program. It is not intended to diagnose MRSA infection nor to guide or monitor treatment for MRSA infections. Test performance is not FDA approved in patients less than 71 years old. Performed at [facility], [address] [place], [place] [address]   Urine Culture  Status: None   Collection Time: 11/25/21  5:43 AM   Specimen: In/Out Cath Urine  Result Value Ref Range Status   Specimen Description   Final    IN/OUT CATH URINE Performed at [facility], [address], [place], [place] [address]    Special Requests   Final    NONE Performed at [facility], [address] [place], [place] [address]    Culture   Final    NO GROWTH Performed at [facility], [place] [address] [place], [place] [address]    Report Status 11/26/2021 FINAL  Final         Radiology Studies: No results found.      Scheduled Meds:  amLODipine  10 mg Oral Daily   aspirin EC  81 mg Oral Daily   carvedilol  12.5 mg Oral BID WC   Chlorhexidine Gluconate Cloth  6 each Topical Daily   cloNIDine  0.3 mg Oral TID   heparin  5,000 Units Subcutaneous Q8H   hydrALAZINE  100 mg Oral Q8H   insulin aspart  0-6 Units Subcutaneous Q4H   linagliptin  5 mg Oral Daily   simvastatin  20 mg Oral Daily   sodium bicarbonate  650 mg Oral BID   Continuous Infusions:  ampicillin-sulbactam (UNASYN) IV Stopped (11/27/21 0323)     LOS: 2 days    Time spent: 35 minutes    Houda Brau Darleen Crocker, DO [facility]  If 7PM-7AM, please contact night-coverage [email or web address] 11/27/2021, 10:55 AM

## 2021-11-28 DIAGNOSIS — J9601 Acute respiratory failure with hypoxia: Secondary | ICD-10-CM | POA: Diagnosis not present

## 2021-11-28 LAB — GLUCOSE, CAPILLARY
Glucose-Capillary: 149 mg/dL — ABNORMAL HIGH (ref 70–99)
Glucose-Capillary: 149 mg/dL — ABNORMAL HIGH (ref 70–99)
Glucose-Capillary: 161 mg/dL — ABNORMAL HIGH (ref 70–99)
Glucose-Capillary: 258 mg/dL — ABNORMAL HIGH (ref 70–99)

## 2021-11-28 LAB — IRON AND TIBC
Iron: 23 ug/dL — ABNORMAL LOW (ref 45–182)
Saturation Ratios: 11 % — ABNORMAL LOW (ref 17.9–39.5)
TIBC: 207 ug/dL — ABNORMAL LOW (ref 250–450)
UIBC: 184 ug/dL

## 2021-11-28 LAB — BASIC METABOLIC PANEL
Anion gap: 7 (ref 5–15)
BUN: 44 mg/dL — ABNORMAL HIGH (ref 8–23)
CO2: 23 mmol/L (ref 22–32)
Calcium: 8.2 mg/dL — ABNORMAL LOW (ref 8.9–10.3)
Chloride: 107 mmol/L (ref 98–111)
Creatinine, Ser: 4.55 mg/dL — ABNORMAL HIGH (ref 0.61–1.24)
GFR, Estimated: 13 mL/min — ABNORMAL LOW (ref >60)
Glucose, Bld: 147 mg/dL — ABNORMAL HIGH (ref 70–99)
Potassium: 4.2 mmol/L (ref 3.5–5.1)
Sodium: 137 mmol/L (ref 135–145)

## 2021-11-28 LAB — CBC
HCT: 28.3 % — ABNORMAL LOW (ref 39.0–52.0)
Hemoglobin: 9.1 g/dL — ABNORMAL LOW (ref 13.0–17.0)
MCH: 29.7 pg (ref 26.0–34.0)
MCHC: 32.2 g/dL (ref 30.0–36.0)
MCV: 92.5 fL (ref 80.0–100.0)
Platelets: 283 10*3/uL (ref 150–400)
RBC: 3.06 MIL/uL — ABNORMAL LOW (ref 4.22–5.81)
RDW: 15.1 % (ref 11.5–15.5)
WBC: 9.5 10*3/uL (ref 4.0–10.5)
nRBC: 0 % (ref 0.0–0.2)

## 2021-11-28 LAB — RETICULOCYTES
Immature Retic Fract: 12 % (ref 2.3–15.9)
RBC.: 3.08 MIL/uL — ABNORMAL LOW (ref 4.22–5.81)
Retic Count, Absolute: 42.8 10*3/uL (ref 19.0–186.0)
Retic Ct Pct: 1.4 % (ref 0.4–3.1)

## 2021-11-28 LAB — FERRITIN: Ferritin: 342 ng/mL — ABNORMAL HIGH (ref 24–336)

## 2021-11-28 LAB — VITAMIN B12: Vitamin B-12: 177 pg/mL — ABNORMAL LOW (ref 180–914)

## 2021-11-28 LAB — MAGNESIUM: Magnesium: 2.1 mg/dL (ref 1.7–2.4)

## 2021-11-28 LAB — FOLATE: Folate: 6.2 ng/mL (ref >5.9)

## 2021-11-28 MED ORDER — SODIUM BICARBONATE 650 MG PO TABS: 650.0000 mg | ORAL_TABLET | Freq: Two times a day (BID) | ORAL | 0 refills | Status: AC

## 2021-11-28 MED ORDER — AMOXICILLIN-POT CLAVULANATE 500-125 MG PO TABS
1.0000 | ORAL_TABLET | Freq: Two times a day (BID) | ORAL | 0 refills | Status: AC
Start: 2021-11-28 — End: 2021-11-30

## 2021-11-28 MED ORDER — LINAGLIPTIN 5 MG PO TABS: 5.0000 mg | ORAL_TABLET | Freq: Every day | ORAL | 0 refills | Status: DC

## 2021-11-28 NOTE — TOC Transition Note (Addendum)
Transition of Care [facility]) - CM/SW Discharge Note   Patient Details  Name: Devin [name] MRN: [id number] Date of Birth: Mar 29, 1945  Transition of Care [facility] CM/SW Contact:  Iona Beard, [place] Phone Number: 11/28/2021, 12:59 PM   Clinical Narrative:    CSW updated that pt was recommended for [facility] PT and a rolling walker. CSW spoke with pt who is agreeable to [facility] with no agency preference and a walker with [employer]. CSW made walker referral to Zack with [employer] who will get the walker delivered to pts home. CSW sent pts referral to [place], [place], [place], [place], [place], and [place]. At this time pts referral has been denied and there is no [place] able to accept. CSW left HIPPA compliant VM for pt requesting return call. CSW will update pt on HH status and inform pt to reach ou to his PCP for [facility] set up or OP PT referral if interested, CSW awaiting return call. TOC signing off.   CSW reached out to [place] with [facility] who states they are able to accept pts referral for [facility] PT.  Final next level of care: [place] Barriers to Discharge: Barriers Resolved   Patient Goals and CMS Choice Patient states their goals for this hospitalization and ongoing recovery are:: return home CMS Medicare.gov Compare Post Acute Care list provided to:: Patient Choice offered to / list presented to : Patient  Discharge Placement                       Discharge Plan and Services                DME Arranged: Walker rolling DME Agency: [employer] Date DME Agency Contacted: 11/28/21   Representative spoke with at DME Agency: [place]: PT [place]: Other - See comment (No [place] agency would accept referral) Date HH Agency Contacted: 11/28/21      Social Determinants of Health (SDOH) Interventions     Readmission Risk Interventions     No data to display

## 2021-11-28 NOTE — Care Management Important Message (Signed)
Important Message  Patient Details  Name: Devin [name] MRN: [id number] Date of Birth: [birth year]   Medicare Important Message Given:  Yes     Tommy Medal 11/28/2021, 11:49 AM

## 2021-11-28 NOTE — Evaluation (Signed)
Physical Therapy Evaluation Patient Details Name: Devin [name] MRN: [id number] DOB: 11-07-1945 Today's Date: 11/28/2021  History of Present Illness  Devin [name] is a 76 y.o. male with medical history significant of CKD stage IV, hypertension, diabetes mellitus type 2, and more presents to the ED with a chief complaint of altered mental status.  Patient reports that he was aware of everything that was going on around him but he could not speak and he could not move his arms and legs.  This was sudden in onset.  It happened while he was watching TV.  He had no pain associated with it.  The only preceding symptom he had was lightheadedness.  When EMS arrived on the site his glucose was 58, he was treated with D10 on the way to the hospital.  In the ER his glucose dropped several more times.  He was treated with amps of D50, and eventually started on D5.  Patient reports that he feels like his mentation is back to baseline.  Patient denies any chest pain, fever, cough.  He reports that he is having normal appetite and does not have choking spells during meals.  He is not on any inhalers at home.  He does feel short of breath with exertion.  He reports that this has been going on for approximately 1 month.  It is better with rest.  He does not wear oxygen at home.  Patient has no other complaints at this time.   Clinical Impression  Patient demonstrates labored movement for sitting up at bedside and had to lean on armrest of chair when transferring without AD.  Patient demonstrates slow unsteady movement having to lean on nearby objects, wall  for support when taking steps without AD, required use of RW for safety demonstrating fair/good return for use with slow slightly labored cadence without loss of balance, on room air with SpO2 at 91-95% and limited mostly due ot fatigue and mild SOB.  Patient tolerated sitting up in chair after therapy on room air with SpO2 at 98%.  Patient will benefit from continued  skilled physical therapy in hospital and recommended venue below to increase strength, balance, endurance for safe ADLs and gait.       Recommendations for follow up therapy are one component of a multi-disciplinary discharge planning process, led by the attending physician.  Recommendations may be updated based on patient status, additional functional criteria and insurance authorization.  Follow Up Recommendations Home health PT      Assistance Recommended at Discharge Set up Supervision/Assistance  Patient can return home with the following  A little help with walking and/or transfers;A little help with bathing/dressing/bathroom;Help with stairs or ramp for entrance;Assistance with cooking/housework    Equipment Recommendations Rolling walker (2 wheels)  Recommendations for Other Services       Functional Status Assessment Patient has had a recent decline in their functional status and demonstrates the ability to make significant improvements in function in a reasonable and predictable amount of time.     Precautions / Restrictions Precautions Precautions: Fall Restrictions Weight Bearing Restrictions: No      Mobility  Bed Mobility Overal bed mobility: Needs Assistance Bed Mobility: Supine to Sit     Supine to sit: Supervision     General bed mobility comments: increased time with labored movement    Transfers Overall transfer level: Needs assistance Equipment used: Rolling walker (2 wheels), None Transfers: Sit to/from Stand, Bed to chair/wheelchair/BSC Sit to Stand: Supervision   Step  pivot transfers: Supervision       General transfer comment: has to lean on wall and nearby objects for support when not using an AD, safer using RW    Ambulation/Gait Ambulation/Gait assistance: Supervision   Assistive device: Rolling walker (2 wheels) Gait Pattern/deviations: Decreased step length - right, Decreased step length - left, Decreased stride length, Trunk  flexed Gait velocity: decreased     General Gait Details: slow unsteady movement having to lean on nearby objects for support when taking steps without AD, required use of RW for safety demonstrating fair/good return for use with slow slightly labored cadence without loss of balance, on room air with SpO2 at 91-95% and limited mostly due ot fatigue and mild SOB  Stairs            Wheelchair Mobility    Modified Rankin (Stroke Patients Only)       Balance Overall balance assessment: Needs assistance Sitting-balance support: Feet supported, No upper extremity supported Sitting balance-Leahy Scale: Good Sitting balance - Comments: seated at EOB   Standing balance support: During functional activity, No upper extremity supported Standing balance-Leahy Scale: Poor Standing balance comment: fair/poor without AD, fair/good using RW                             Pertinent Vitals/Pain Pain Assessment Pain Assessment: No/denies pain    Home Living Family/patient expects to be discharged to:: Private residence Living Arrangements: Spouse/significant other Available Help at Discharge: Family;Available 24 hours/day Type of Home: House Home Access: Stairs to enter Entrance Stairs-Rails: Right;Left;Can reach both Entrance Stairs-Number of Steps: 4-5 Alternate Level Stairs-Number of Steps: 11 steps to basement Home Layout: Able to live on main level with bedroom/bathroom;Full bath on main level;Two level Home Equipment: Shower seat - built in;Grab bars - tub/shower;Cane - single point      Prior Function Prior Level of Function : Independent/Modified Independent             Mobility Comments: Community ambulator without AD, drives, shops ADLs Comments: Independent     Hand Dominance        Extremity/Trunk Assessment   Upper Extremity Assessment Upper Extremity Assessment: Overall WFL for tasks assessed    Lower Extremity Assessment Lower Extremity  Assessment: Generalized weakness    Cervical / Trunk Assessment Cervical / Trunk Assessment: Kyphotic  Communication   Communication: No difficulties  Cognition Arousal/Alertness: Awake/alert Behavior During Therapy: WFL for tasks assessed/performed Overall Cognitive Status: Within Functional Limits for tasks assessed                                          General Comments      Exercises     Assessment/Plan    PT Assessment Patient needs continued PT services  PT Problem List Decreased strength;Decreased activity tolerance;Decreased balance;Decreased mobility       PT Treatment Interventions DME instruction;Gait training;Stair training;Functional mobility training;Therapeutic activities;Therapeutic exercise;Patient/family education;Balance training    PT Goals (Current goals can be found in the Care Plan section)  Acute Rehab PT Goals Patient Stated Goal: return home with family to assist PT Goal Formulation: With patient Time For Goal Achievement: 12/01/21 Potential to Achieve Goals: Good    Frequency Min 3X/week     Co-evaluation               AM-PAC PT "  6 Clicks" Mobility  Outcome Measure Help needed turning from your back to your side while in a flat bed without using bedrails?: None Help needed moving from lying on your back to sitting on the side of a flat bed without using bedrails?: A Little Help needed moving to and from a bed to a chair (including a wheelchair)?: A Little Help needed standing up from a chair using your arms (e.g., wheelchair or bedside chair)?: A Little Help needed to walk in hospital room?: A Little Help needed climbing 3-5 steps with a railing? : A Little 6 Click Score: 19    End of Session   Activity Tolerance: Patient tolerated treatment well;Patient limited by fatigue Patient left: in chair;with call bell/phone within reach Nurse Communication: Mobility status PT Visit Diagnosis: Unsteadiness on feet  (R26.81);Other abnormalities of gait and mobility (R26.89);Muscle weakness (generalized) (M62.81)    Time: 4129-0475 PT Time Calculation (min) (ACUTE ONLY): 27 min   Charges:   PT Evaluation $PT Eval Moderate Complexity: 1 Mod PT Treatments $Therapeutic Activity: 23-37 mins        9:15 AM, 11/28/21 Lonell Grandchild, MPT Physical Therapist with [facility] [phone] office [phone] mobile phone

## 2021-11-28 NOTE — Plan of Care (Signed)
  Problem: Acute Rehab PT Goals(only PT should resolve) Goal: Pt Will Go Supine/Side To Sit Outcome: Progressing Flowsheets (Taken 11/28/2021 0916) Pt will go Supine/Side to Sit: with modified independence Goal: Patient Will Transfer Sit To/From Stand Outcome: Progressing Flowsheets (Taken 11/28/2021 0916) Patient will transfer sit to/from stand: with modified independence Goal: Pt Will Transfer Bed To Chair/Chair To Bed Outcome: Progressing Flowsheets (Taken 11/28/2021 0916) Pt will Transfer Bed to Chair/Chair to Bed: with modified independence Goal: Pt Will Ambulate Outcome: Progressing Flowsheets (Taken 11/28/2021 0916) Pt will Ambulate:  > 125 feet  with modified independence  with rolling walker   9:16 AM, 11/28/21 Lonell Grandchild, MPT Physical Therapist with [facility] [phone] office [phone] mobile phone

## 2021-11-28 NOTE — Plan of Care (Signed)

## 2021-11-28 NOTE — Discharge Summary (Signed)
Physician Discharge Summary  Devin [name] ZOX:[id number] DOB: 1945/12/27 DOA: 11/25/2021  PCP: Caryl Bis, MD  Admit date: 11/25/2021  Discharge date: 11/28/2021  Admitted From:Home  Disposition:  Home  Recommendations for Outpatient Follow-up:  Follow up with PCP in 1 week and repeat BMP and CBC Follow-up with nephrologist Dr. Theador Hawthorne, message sent for close follow up as it appears that he will need dialysis initiated soon Continue sodium bicarbonate twice daily as prescribed Discontinue glipizide and continue now on [place] course of pneumonia treatment with Augmentin for 2 more days  Home Health: Yes with PT  Equipment/Devices: Rolling walker  Discharge Condition:Stable  CODE STATUS: Full  Diet recommendation: Heart Healthy/carb modified  Brief/Interim Summary: Devin [name] is a 76 y.o. male with medical history significant of CKD stage IV, hypertension, diabetes mellitus type 2, and more presents to the ED with a chief complaint of altered mental status.  He was admitted with acute metabolic encephalopathy in the setting of hypoglycemia in the setting of worsening chronic kidney disease.  He is also noted to have acute hypoxemic respiratory failure with suspicion of aspiration pneumonia.  He was taken off of his home glipizide and required initial D10 infusion and he was noted to have poor appetite which gradually improved.  His blood glucose levels now remained stable and he has been transitioned to [place] with adequate control noted.  He was also empirically being treated with IV Unasyn for aspiration pneumonia and will be transition to Augmentin on discharge as prescribed.  He is creatinine continues to trend up and case was discussed with nephrology, Dr. Candiss Norse who is in agreement that patient will require close nephrology follow-up and will need to remain on sodium bicarbonate as prescribed for metabolic acidosis.  No other acute events or concerns noted throughout  the course of this admission and he is stable for discharge.  Discharge Diagnoses:  Principal Problem:   Acute respiratory failure with hypoxia ([place]) Active Problems:   Acute metabolic encephalopathy   Hypoglycemic coma due to diabetes mellitus ([place])   CAP (community acquired pneumonia)   AKI (acute kidney injury) ([place])   Essential hypertension  Principal discharge diagnosis: Acute metabolic encephalopathy secondary to hypoglycemia in the setting of glipizide use with AKI on CKD stage IV.  Acute hypoxemic respiratory failure secondary to aspiration pneumonia in relation to metabolic encephalopathy.  Discharge Instructions  Discharge Instructions     Diet - low sodium heart healthy   Complete by: As directed    Increase activity slowly   Complete by: As directed       Allergies as of 11/28/2021   No Known Allergies      Medication List     STOP taking these medications    glipiZIDE XL 10 MG 24 hr tablet Generic drug: glipiZIDE       TAKE these medications    allopurinol 300 MG tablet Commonly known as: ZYLOPRIM Take 300 mg by mouth daily.   amLODipine 10 MG tablet Commonly known as: NORVASC Take 10 mg by mouth daily.   amoxicillin-clavulanate 500-125 MG tablet Commonly known as: Augmentin Take 1 tablet (500 mg total) by mouth 2 (two) times daily for 2 days.   aspirin EC 81 MG tablet Take 81 mg by mouth daily. Swallow whole.   carvedilol 12.5 MG tablet Commonly known as: COREG Take 12.5 mg by mouth 2 (two) times daily with a meal.   cloNIDine 0.3 MG tablet Commonly known as: CATAPRES Take 0.3 mg by mouth  in the morning, at noon, and at bedtime.   doxercalciferol 0.5 MCG capsule Commonly known as: HECTOROL Take 0.5 mcg by mouth 3 (three) times a week.   hydrALAZINE 100 MG tablet Commonly known as: APRESOLINE Take 100 mg by mouth in the morning, at noon, and at bedtime.   Lasix 40 MG tablet Generic drug: furosemide Take 40 mg by mouth daily.    linagliptin 5 MG Tabs tablet Commonly known as: TRADJENTA Take 1 tablet (5 mg total) by mouth daily. Start taking on: November 29, 2021   simvastatin 20 MG tablet Commonly known as: ZOCOR Take 20 mg by mouth daily.   sodium bicarbonate 650 MG tablet Take 1 tablet (650 mg total) by mouth 2 (two) times daily.               Durable Medical Equipment  (From admission, onward)           Start     Ordered   11/28/21 1031  For home use only DME Walker rolling  Once       Question Answer Comment  Walker: With [place] Wheels   Patient needs a walker to treat with the following condition Weakness      11/28/21 1030   11/28/21 0931  For home use only DME Walker rolling  Once       Comments: Patient unsteady and at risk for falls without AD, requires use of RW for safety with good return for use demonstrated  Question Answer Comment  Walker: With [place]   Patient needs a walker to treat with the following condition Gait difficulty      11/28/21 0931            Follow-up Information     Caryl Bis, MD. Schedule an appointment as soon as possible for a visit in 1 week(s).   Specialty: Family Medicine Contact information: [place] [address] [phone]         Liana Gerold, MD. Go to.   Specialty: Nephrology Contact information: [address] [address] [phone]                No Known Allergies  Consultations: Discussed case with nephrology   Procedures/Studies: ECHOCARDIOGRAM COMPLETE  Result Date: 11/25/2021    ECHOCARDIOGRAM REPORT   Patient Name:   Devin [name] Date of Exam: 11/25/2021 Medical Rec #:  [id number]     Height:       69.0 in Accession #:    [id number]    Weight:       233.0 lb Date of Birth:  1946-02-22     BSA:          2.205 m Patient Age:    71 years      BP:           179/73 mmHg Patient Gender: M             HR:           82 bpm. Exam Location:  [facility] Procedure: 2D  Echo, Cardiac Doppler and Color Doppler Indications:    Dyspnea  History:        Patient has no prior history of Echocardiogram examinations.                 Risk Factors:Hypertension and Diabetes.  Sonographer:    Wenda Low Referring Phys: [id number] ASIA B [place]  1. Left ventricular ejection fraction,  by estimation, is 55 to 60%. The left ventricle has normal function. The left ventricle has no regional wall motion abnormalities. There is mild concentric left ventricular hypertrophy. Left ventricular diastolic parameters are consistent with Grade II diastolic dysfunction (pseudonormalization).  2. Right ventricular systolic function is normal. The right ventricular size is mildly enlarged. There is moderately elevated pulmonary artery systolic pressure. The estimated right ventricular systolic pressure is 69.6 mmHg.  3. Left atrial size was severely dilated.  4. The mitral valve is abnormal. No evidence of mitral valve regurgitation. No evidence of mitral stenosis.  5. The aortic valve is tricuspid. There is moderate calcification of the aortic valve. Aortic valve regurgitation is not visualized. Aortic valve sclerosis/calcification is present, without any evidence of aortic stenosis.  6. The inferior vena cava is dilated in size with >50% respiratory variability, suggesting right atrial pressure of 8 mmHg. Comparison(s): No prior Echocardiogram. FINDINGS  Left Ventricle: Short axis assessment of hypertrophy. Left ventricular ejection fraction, by estimation, is 55 to 60%. The left ventricle has normal function. The left ventricle has no regional wall motion abnormalities. The left ventricular internal cavity size was normal in size. There is mild concentric left ventricular hypertrophy. Left ventricular diastolic parameters are consistent with Grade II diastolic dysfunction (pseudonormalization). Right Ventricle: The right ventricular size is mildly enlarged. No increase in right  ventricular wall thickness. Right ventricular systolic function is normal. There is moderately elevated pulmonary artery systolic pressure. The tricuspid regurgitant velocity is 3.22 m/s, and with an assumed right atrial pressure of 8 mmHg, the estimated right ventricular systolic pressure is 29.5 mmHg. Left Atrium: Left atrial size was severely dilated. Right Atrium: Right atrial size was normal in size. Pericardium: There is no evidence of pericardial effusion. Mitral Valve: 2D MVA 4.58 cm2, 2.64 cm2 by contiuity. The mitral valve is abnormal. No evidence of mitral valve regurgitation. No evidence of mitral valve stenosis. MV peak gradient, 7.4 mmHg. The mean mitral valve gradient is 3.0 mmHg. Tricuspid Valve: The tricuspid valve is normal in structure. Tricuspid valve regurgitation is not demonstrated. No evidence of tricuspid stenosis. Aortic Valve: The aortic valve is tricuspid. There is moderate calcification of the aortic valve. Aortic valve regurgitation is not visualized. Aortic valve sclerosis/calcification is present, without any evidence of aortic stenosis. Aortic valve mean gradient measures 7.0 mmHg. Aortic valve peak gradient measures 11.3 mmHg. Aortic valve area, by VTI measures 2.58 cm. Pulmonic Valve: The pulmonic valve was normal in structure. Pulmonic valve regurgitation is not visualized. No evidence of pulmonic stenosis. Aorta: The aortic root is normal in size and structure. Venous: The inferior vena cava is dilated in size with greater than 50% respiratory variability, suggesting right atrial pressure of 8 mmHg. IAS/Shunts: No atrial level shunt detected by color flow Doppler.  LEFT VENTRICLE PLAX 2D LVIDd:         5.40 cm      Diastology LVIDs:         3.90 cm      LV e' medial:    4.46 cm/s LV PW:         1.20 cm      LV E/e' medial:  25.6 LV IVS:        1.50 cm      LV e' lateral:   5.59 cm/s LVOT diam:     2.00 cm      LV E/e' lateral: 20.4 LV SV:         94 LV SV Index:  43 LVOT Area:      3.14 cm  LV Volumes (MOD) LV vol d, MOD A2C: 135.0 ml LV vol d, MOD A4C: 104.0 ml LV vol s, MOD A2C: 58.3 ml LV vol s, MOD A4C: 43.7 ml LV SV MOD A2C:     76.7 ml LV SV MOD A4C:     104.0 ml LV SV MOD BP:      69.7 ml RIGHT VENTRICLE RV Basal diam:  4.00 cm RV Mid diam:    3.40 cm RV S prime:     17.00 cm/s TAPSE (M-mode): 2.9 cm LEFT ATRIUM              Index        RIGHT ATRIUM           Index LA diam:        5.40 cm  2.45 cm/m   RA Area:     20.00 cm LA Vol (A2C):   128.0 ml 58.06 ml/m  RA Volume:   57.70 ml  26.17 ml/m LA Vol (A4C):   152.0 ml 68.95 ml/m LA Biplane Vol: 142.0 ml 64.41 ml/m  AORTIC VALVE                     PULMONIC VALVE AV Area (Vmax):    2.37 cm      PV Vmax:       0.84 m/s AV Area (Vmean):   2.48 cm      PV Peak grad:  2.8 mmHg AV Area (VTI):     2.58 cm AV Vmax:           168.00 cm/s AV Vmean:          120.000 cm/s AV VTI:            0.364 m AV Peak Grad:      11.3 mmHg AV Mean Grad:      7.0 mmHg LVOT Vmax:         127.00 cm/s LVOT Vmean:        94.600 cm/s LVOT VTI:          0.299 m LVOT/AV VTI ratio: 0.82  AORTA Ao Root diam: 3.20 cm Ao Asc diam:  3.10 cm MITRAL VALVE                TRICUSPID VALVE MV Area (PHT): 3.79 cm     TR Peak grad:   41.5 mmHg MV Area VTI:   2.64 cm     TR Vmax:        322.00 cm/s MV Peak grad:  7.4 mmHg MV Mean grad:  3.0 mmHg     SHUNTS MV Vmax:       1.36 m/s     Systemic VTI:  0.30 m MV Vmean:      83.6 cm/s    Systemic Diam: 2.00 cm MV Decel Time: 200 msec MV E velocity: 114.00 cm/s MV A velocity: 99.80 cm/s MV E/A ratio:  1.14 Rudean Haskell MD Electronically signed by Rudean Haskell MD Signature Date/Time: 11/25/2021/10:43:47 AM    Final    DG Chest Port 1 View  Result Date: 11/25/2021 CLINICAL DATA:  Questionable sepsis - evaluate for abnormality EXAM: PORTABLE CHEST 1 VIEW COMPARISON:  None Available. FINDINGS: Prominent cardiac silhouette likely due to AP portable technique. The heart and mediastinal contours are within normal  limits. Bibasilar airspace opacities. No pulmonary edema. Trace right left pleural effusion. No pneumothorax. No acute osseous abnormality.  Degenerative changes of the right shoulder. IMPRESSION: Bibasilar airspace opacities and trace right pleural effusion suggestive of infection/inflammation. Followup PA and lateral chest X-ray is recommended in 3-4 weeks following therapy to ensure resolution and exclude underlying malignancy. Electronically Signed   By: Iven Finn M.D.   On: 11/25/2021 01:10     Discharge Exam: Vitals:   11/27/21 2026 11/28/21 0500  BP: (!) 152/67 133/61  Pulse: 71 60  Resp: 19 19  Temp: 98.4 F (36.9 C) 98.2 F (36.8 C)  SpO2: 96% 99%   Vitals:   11/27/21 0900 11/27/21 1407 11/27/21 2026 11/28/21 0500  BP: (!) 161/59 (!) 137/49 (!) 152/67 133/61  Pulse: 71 66 71 60  Resp: 20 19 19 19   Temp: 98.1 F (36.7 C) 98.4 F (36.9 C) 98.4 F (36.9 C) 98.2 F (36.8 C)  TempSrc:    Oral  SpO2: 97% 96% 96% 99%  Weight:      Height:        General: Pt is alert, awake, not in acute distress Cardiovascular: RRR, S1/S2 +, no rubs, no gallops Respiratory: CTA bilaterally, no wheezing, no rhonchi Abdominal: Soft, NT, ND, bowel sounds + Extremities: no edema, no cyanosis    The results of significant diagnostics from this hospitalization (including imaging, microbiology, ancillary and laboratory) are listed below for reference.     Microbiology: Recent Results (from the past 240 hour(s))  Blood Culture (routine x 2)     Status: None (Preliminary result)   Collection Time: 11/25/21  1:54 AM   Specimen: BLOOD  Result Value Ref Range Status   Specimen Description BLOOD RIGHT ANTECUBITAL  Final   Special Requests   Final    BOTTLES DRAWN AEROBIC AND ANAEROBIC Blood Culture adequate volume   Culture   Final    NO GROWTH 2 DAYS Performed at [facility], [address], [place], [place] [address]    Report Status PENDING  Incomplete  Blood Culture (routine x 2)      Status: None (Preliminary result)   Collection Time: 11/25/21  1:57 AM   Specimen: BLOOD  Result Value Ref Range Status   Specimen Description BLOOD LEFT ANTECUBITAL  Final   Special Requests   Final    BOTTLES DRAWN AEROBIC AND ANAEROBIC Blood Culture adequate volume   Culture   Final    NO GROWTH 2 DAYS Performed at [facility], [address], [place], [place] [address]    Report Status PENDING  Incomplete  SARS Coronavirus 2 by RT PCR (hospital order, performed in [place] hospital lab) *cepheid single result test* Anterior Nasal Swab     Status: None   Collection Time: 11/25/21  2:52 AM   Specimen: Anterior Nasal Swab  Result Value Ref Range Status   SARS Coronavirus 2 by RT PCR NEGATIVE NEGATIVE Final    Comment: (NOTE) SARS-CoV-2 target nucleic acids are NOT DETECTED.  The SARS-CoV-2 RNA is generally detectable in upper and lower respiratory specimens during the acute phase of infection. The lowest concentration of SARS-CoV-2 viral copies this assay can detect is 250 copies / mL. A negative result does not preclude SARS-CoV-2 infection and should not be used as the sole basis for treatment or other patient management decisions.  A negative result may occur with improper specimen collection / handling, submission of specimen other than nasopharyngeal swab, presence of viral mutation(s) within the areas targeted by this assay, and inadequate number of viral copies (<250 copies / mL). A negative result must  be combined with clinical observations, patient history, and epidemiological information.  Fact Sheet for Patients:   https://www.patel.info/  Fact Sheet for Healthcare Providers: https://hall.com/  This test is not yet approved or  cleared by the [place] FDA and has been authorized for detection and/or diagnosis of SARS-CoV-2 by FDA under an Emergency Use Authorization (EUA).  This EUA will remain in effect  (meaning this test can be used) for the duration of the COVID-19 declaration under Section 564(b)(1) of the Act, 21 U.S.C. section 360bbb-3(b)(1), unless the authorization is terminated or revoked sooner.  Performed at [facility], [address] [place], [place] [address]   MRSA Next Gen by PCR, Nasal     Status: None   Collection Time: 11/25/21  5:12 AM   Specimen: Nasal Mucosa; Nasal Swab  Result Value Ref Range Status   MRSA by PCR Next Gen NOT DETECTED NOT DETECTED Final    Comment: (NOTE) The GeneXpert MRSA Assay (FDA approved for NASAL specimens only), is one component of a comprehensive MRSA colonization surveillance program. It is not intended to diagnose MRSA infection nor to guide or monitor treatment for MRSA infections. Test performance is not FDA approved in patients less than 26 years old. Performed at [facility], [address], [place], [place] [address]   Urine Culture     Status: None   Collection Time: 11/25/21  5:43 AM   Specimen: In/Out Cath Urine  Result Value Ref Range Status   Specimen Description   Final    IN/OUT CATH URINE Performed at [facility], [address] [place], [place] [address]    Special Requests   Final    NONE Performed at [facility], [address], [place], [place] [address]    Culture   Final    NO GROWTH Performed at [facility], [place] [address], [place], [place] [address]    Report Status 11/26/2021 FINAL  Final     Labs: BNP (last 3 results) No results for input(s): "BNP" in the last 8760 hours. Basic Metabolic Panel: Recent Labs  Lab 11/25/21 0157 11/25/21 0426 11/26/21 0418 11/27/21 0433 11/28/21 0451  NA 142 140 136 135 137  K 4.0 3.7 3.6 4.0 4.2  CL 112* 112* 108 107 107  CO2 22 19* 24 21* 23  GLUCOSE 61* 78 94 168* 147*  BUN 41* 40* 35* 41* 44*  CREATININE 4.29* 4.19* 4.13* 4.45* 4.55*  CALCIUM 9.0 8.6* 8.3* 8.2* 8.2*  MG  --  2.3  --  2.1 2.1   Liver Function Tests: Recent Labs  Lab  11/25/21 0157 11/25/21 0426  AST 30 27  ALT 20 16  ALKPHOS 87 79  BILITOT 0.4 0.5  PROT 8.3* 7.7  ALBUMIN 3.9 3.6   No results for input(s): "LIPASE", "AMYLASE" in the last 168 hours. No results for input(s): "AMMONIA" in the last 168 hours. CBC: Recent Labs  Lab 11/25/21 0157 11/25/21 0426 11/26/21 0418 11/27/21 0433 11/28/21 0451  WBC 9.4 9.1 11.3* 8.8 9.5  NEUTROABS 7.8* 6.8  --   --   --   HGB 10.6* 10.4* 10.2* 9.3* 9.1*  HCT 32.5* 32.8* 30.8* 28.4* 28.3*  MCV 91.8 95.1 91.9 92.8 92.5  PLT 340 317 317 258 283   Cardiac Enzymes: No results for input(s): "CKTOTAL", "CKMB", "CKMBINDEX", "TROPONINI" in the last 168 hours. BNP: Invalid input(s): "POCBNP" CBG: Recent Labs  Lab 11/27/21 1953 11/27/21 2027 11/28/21 0000 11/28/21 0507 11/28/21 0708  GLUCAP 174* 183*  161* 149* 149*   D-Dimer No results for input(s): "DDIMER" in the last 72 hours. Hgb A1c No results for input(s): "HGBA1C" in the last 72 hours. Lipid Profile No results for input(s): "CHOL", "HDL", "LDLCALC", "TRIG", "CHOLHDL", "LDLDIRECT" in the last 72 hours. Thyroid function studies No results for input(s): "TSH", "T4TOTAL", "T3FREE", "THYROIDAB" in the last 72 hours.  Invalid input(s): "FREET3" Anemia work up Recent Labs    11/28/21 0448 11/28/21 0450  VITAMINB12 177*  --   FOLATE 6.2  --   FERRITIN 342*  --   TIBC 207*  --   IRON 23*  --   RETICCTPCT  --  1.4   Urinalysis    Component Value Date/Time   COLORURINE YELLOW 11/25/2021 0543   APPEARANCEUR CLEAR 11/25/2021 0543   APPEARANCEUR Clear 10/23/2021 1406   LABSPEC >1.030 (H) 11/25/2021 0543   PHURINE 5.5 11/25/2021 0543   GLUCOSEU NEGATIVE 11/25/2021 0543   HGBUR NEGATIVE 11/25/2021 0543   BILIRUBINUR NEGATIVE 11/25/2021 0543   BILIRUBINUR Negative 10/23/2021 1406   KETONESUR NEGATIVE 11/25/2021 0543   PROTEINUR 100 (A) 11/25/2021 0543   NITRITE NEGATIVE 11/25/2021 0543   LEUKOCYTESUR NEGATIVE 11/25/2021 0543   Sepsis  Labs Recent Labs  Lab 11/25/21 0426 11/26/21 0418 11/27/21 0433 11/28/21 0451  WBC 9.1 11.3* 8.8 9.5   Microbiology Recent Results (from the past 240 hour(s))  Blood Culture (routine x 2)     Status: None (Preliminary result)   Collection Time: 11/25/21  1:54 AM   Specimen: BLOOD  Result Value Ref Range Status   Specimen Description BLOOD RIGHT ANTECUBITAL  Final   Special Requests   Final    BOTTLES DRAWN AEROBIC AND ANAEROBIC Blood Culture adequate volume   Culture   Final    NO GROWTH 2 DAYS Performed at [facility], [address] [place], [place] [address]    Report Status PENDING  Incomplete  Blood Culture (routine x 2)     Status: None (Preliminary result)   Collection Time: 11/25/21  1:57 AM   Specimen: BLOOD  Result Value Ref Range Status   Specimen Description BLOOD LEFT ANTECUBITAL  Final   Special Requests   Final    BOTTLES DRAWN AEROBIC AND ANAEROBIC Blood Culture adequate volume   Culture   Final    NO GROWTH 2 DAYS Performed at [facility], [address], [place], [place] [address]    Report Status PENDING  Incomplete  SARS Coronavirus 2 by RT PCR (hospital order, performed in [place] hospital lab) *cepheid single result test* Anterior Nasal Swab     Status: None   Collection Time: 11/25/21  2:52 AM   Specimen: Anterior Nasal Swab  Result Value Ref Range Status   SARS Coronavirus 2 by RT PCR NEGATIVE NEGATIVE Final    Comment: (NOTE) SARS-CoV-2 target nucleic acids are NOT DETECTED.  The SARS-CoV-2 RNA is generally detectable in upper and lower respiratory specimens during the acute phase of infection. The lowest concentration of SARS-CoV-2 viral copies this assay can detect is 250 copies / mL. A negative result does not preclude SARS-CoV-2 infection and should not be used as the sole basis for treatment or other patient management decisions.  A negative result may occur with improper specimen collection / handling, submission of specimen  other than nasopharyngeal swab, presence of viral mutation(s) within the areas targeted by this assay, and inadequate number of viral copies (<250 copies / mL). A negative result must be combined with clinical observations, patient  history, and epidemiological information.  Fact Sheet for Patients:   https://www.patel.info/  Fact Sheet for Healthcare Providers: https://hall.com/  This test is not yet approved or  cleared by the [place] FDA and has been authorized for detection and/or diagnosis of SARS-CoV-2 by FDA under an Emergency Use Authorization (EUA).  This EUA will remain in effect (meaning this test can be used) for the duration of the COVID-19 declaration under Section 564(b)(1) of the Act, 21 U.S.C. section 360bbb-3(b)(1), unless the authorization is terminated or revoked sooner.  Performed at [facility], [address] [place], [place] [address]   MRSA Next Gen by PCR, Nasal     Status: None   Collection Time: 11/25/21  5:12 AM   Specimen: Nasal Mucosa; Nasal Swab  Result Value Ref Range Status   MRSA by PCR Next Gen NOT DETECTED NOT DETECTED Final    Comment: (NOTE) The GeneXpert MRSA Assay (FDA approved for NASAL specimens only), is one component of a comprehensive MRSA colonization surveillance program. It is not intended to diagnose MRSA infection nor to guide or monitor treatment for MRSA infections. Test performance is not FDA approved in patients less than 36 years old. Performed at [facility], [address], [place], [place] [address]   Urine Culture     Status: None   Collection Time: 11/25/21  5:43 AM   Specimen: In/Out Cath Urine  Result Value Ref Range Status   Specimen Description   Final    IN/OUT CATH URINE Performed at [facility], [address] [place], [place] [address]    Special Requests   Final    NONE Performed at [facility], [address], [place], [place] [address]    Culture    Final    NO GROWTH Performed at [facility], [place] [address] [place], [place] [address]    Report Status 11/26/2021 FINAL  Final     Time coordinating discharge: 35 minutes  SIGNED:   Rodena Goldmann, DO [facility] 11/28/2021, 10:49 AM  If 7PM-7AM, please contact night-coverage [email or web address]

## 2021-11-30 LAB — CULTURE, BLOOD (ROUTINE X 2)
Culture: NO GROWTH
Culture: NO GROWTH
Special Requests: ADEQUATE
Special Requests: ADEQUATE

## 2021-12-08 ENCOUNTER — Other Ambulatory Visit: Payer: Self-pay

## 2021-12-08 DIAGNOSIS — E1122 Type 2 diabetes mellitus with diabetic chronic kidney disease: Secondary | ICD-10-CM | POA: Diagnosis not present

## 2021-12-08 DIAGNOSIS — N184 Chronic kidney disease, stage 4 (severe): Secondary | ICD-10-CM | POA: Diagnosis not present

## 2021-12-08 DIAGNOSIS — J690 Pneumonitis due to inhalation of food and vomit: Secondary | ICD-10-CM | POA: Diagnosis not present

## 2021-12-08 DIAGNOSIS — Z23 Encounter for immunization: Secondary | ICD-10-CM | POA: Diagnosis not present

## 2021-12-08 DIAGNOSIS — N2581 Secondary hyperparathyroidism of renal origin: Secondary | ICD-10-CM | POA: Diagnosis not present

## 2021-12-08 NOTE — Telephone Encounter (Signed)
Spoke with patient. Scheduled surgery for 12/16/21. Instructions reviewed- verbalized understanding.

## 2021-12-08 NOTE — Telephone Encounter (Signed)
Received a request from Dr. Toya Smothers office requesting urgent placement of AVF.   Attempted to reach patient. LVM to return call.

## 2021-12-12 ENCOUNTER — Encounter (HOSPITAL_COMMUNITY)
Admission: RE | Admit: 2021-12-12 | Discharge: 2021-12-12 | Disposition: A | Discharge status: Home / Self Care | Payer: Medicare Other | Source: Ambulatory Visit | Attending: Vascular Surgery | Admitting: Vascular Surgery

## 2021-12-12 ENCOUNTER — Encounter (HOSPITAL_COMMUNITY): Payer: Self-pay

## 2021-12-12 VITALS — Ht 69.0 in | Wt 235.9 lb

## 2021-12-12 DIAGNOSIS — Z992 Dependence on renal dialysis: Secondary | ICD-10-CM

## 2021-12-15 DIAGNOSIS — N189 Chronic kidney disease, unspecified: Secondary | ICD-10-CM | POA: Diagnosis not present

## 2021-12-16 ENCOUNTER — Encounter (HOSPITAL_COMMUNITY): Payer: Self-pay | Admitting: Vascular Surgery

## 2021-12-16 ENCOUNTER — Other Ambulatory Visit: Payer: Self-pay

## 2021-12-16 ENCOUNTER — Encounter (HOSPITAL_COMMUNITY)
Admission: RE | Disposition: A | Discharge status: Home / Self Care | Payer: Self-pay | Source: Home / Self Care | Attending: Vascular Surgery

## 2021-12-16 ENCOUNTER — Ambulatory Visit (HOSPITAL_BASED_OUTPATIENT_CLINIC_OR_DEPARTMENT_OTHER): Payer: Medicare Other | Admitting: Anesthesiology

## 2021-12-16 ENCOUNTER — Ambulatory Visit (HOSPITAL_COMMUNITY): Payer: Medicare Other | Admitting: Anesthesiology

## 2021-12-16 ENCOUNTER — Ambulatory Visit (HOSPITAL_COMMUNITY)
Admission: RE | Admit: 2021-12-16 | Discharge: 2021-12-16 | Disposition: A | Discharge status: Home / Self Care | Payer: Medicare Other | Attending: Vascular Surgery | Admitting: Vascular Surgery

## 2021-12-16 DIAGNOSIS — E1122 Type 2 diabetes mellitus with diabetic chronic kidney disease: Secondary | ICD-10-CM | POA: Insufficient documentation

## 2021-12-16 DIAGNOSIS — N186 End stage renal disease: Secondary | ICD-10-CM | POA: Diagnosis not present

## 2021-12-16 DIAGNOSIS — N184 Chronic kidney disease, stage 4 (severe): Secondary | ICD-10-CM | POA: Diagnosis not present

## 2021-12-16 DIAGNOSIS — Z87891 Personal history of nicotine dependence: Secondary | ICD-10-CM

## 2021-12-16 DIAGNOSIS — I129 Hypertensive chronic kidney disease with stage 1 through stage 4 chronic kidney disease, or unspecified chronic kidney disease: Secondary | ICD-10-CM | POA: Diagnosis not present

## 2021-12-16 DIAGNOSIS — N179 Acute kidney failure, unspecified: Secondary | ICD-10-CM

## 2021-12-16 DIAGNOSIS — I120 Hypertensive chronic kidney disease with stage 5 chronic kidney disease or end stage renal disease: Secondary | ICD-10-CM | POA: Diagnosis not present

## 2021-12-16 DIAGNOSIS — N185 Chronic kidney disease, stage 5: Secondary | ICD-10-CM

## 2021-12-16 HISTORY — PX: AV FISTULA PLACEMENT: SHX1204

## 2021-12-16 LAB — HEMOGLOBIN AND HEMATOCRIT, BLOOD
HCT: 34.4 % — ABNORMAL LOW (ref 39.0–52.0)
Hemoglobin: 11 g/dL — ABNORMAL LOW (ref 13.0–17.0)

## 2021-12-16 LAB — BASIC METABOLIC PANEL
Anion gap: 12 (ref 5–15)
BUN: 35 mg/dL — ABNORMAL HIGH (ref 8–23)
CO2: 20 mmol/L — ABNORMAL LOW (ref 22–32)
Calcium: 8.8 mg/dL — ABNORMAL LOW (ref 8.9–10.3)
Chloride: 108 mmol/L (ref 98–111)
Creatinine, Ser: 3.86 mg/dL — ABNORMAL HIGH (ref 0.61–1.24)
GFR, Estimated: 15 mL/min — ABNORMAL LOW (ref >60)
Glucose, Bld: 139 mg/dL — ABNORMAL HIGH (ref 70–99)
Potassium: 3.9 mmol/L (ref 3.5–5.1)
Sodium: 140 mmol/L (ref 135–145)

## 2021-12-16 LAB — GLUCOSE, CAPILLARY: Glucose-Capillary: 126 mg/dL — ABNORMAL HIGH (ref 70–99)

## 2021-12-16 SURGERY — ARTERIOVENOUS (AV) FISTULA CREATION
Anesthesia: General | Site: Arm Lower | Laterality: Left

## 2021-12-16 MED ORDER — OXYCODONE HCL 5 MG/5ML PO SOLN: 5.0000 mg | Freq: Once | ORAL | Status: DC | PRN

## 2021-12-16 MED ORDER — CEFAZOLIN SODIUM-DEXTROSE 2-4 GM/100ML-% IV SOLN
INTRAVENOUS | Status: AC
  Filled 2021-12-16: qty 100

## 2021-12-16 MED ORDER — FENTANYL CITRATE PF 50 MCG/ML IJ SOSY: 25.0000 ug | PREFILLED_SYRINGE | INTRAMUSCULAR | Status: DC | PRN

## 2021-12-16 MED ORDER — HEPARIN 6000 UNIT IRRIGATION SOLUTION
Status: DC | PRN
  Administered 2021-12-16: 1

## 2021-12-16 MED ORDER — ONDANSETRON HCL 4 MG/2ML IJ SOLN: 4.0000 mg | Freq: Once | INTRAMUSCULAR | Status: DC | PRN

## 2021-12-16 MED ORDER — CHLORHEXIDINE GLUCONATE 0.12 % MT SOLN
15.0000 mL | Freq: Once | OROMUCOSAL | Status: AC
  Administered 2021-12-16: 15 mL via OROMUCOSAL

## 2021-12-16 MED ORDER — LACTATED RINGERS IV SOLN: INTRAVENOUS | Status: DC

## 2021-12-16 MED ORDER — CHLORHEXIDINE GLUCONATE 4 % EX LIQD: 60.0000 mL | Freq: Once | CUTANEOUS | Status: DC

## 2021-12-16 MED ORDER — PROPOFOL 500 MG/50ML IV EMUL
INTRAVENOUS | Status: AC
  Filled 2021-12-16: qty 50

## 2021-12-16 MED ORDER — HEPARIN SODIUM (PORCINE) 1000 UNIT/ML IJ SOLN
INTRAMUSCULAR | Status: AC
  Filled 2021-12-16: qty 6

## 2021-12-16 MED ORDER — FENTANYL CITRATE (PF) 100 MCG/2ML IJ SOLN
INTRAMUSCULAR | Status: AC
  Filled 2021-12-16: qty 2

## 2021-12-16 MED ORDER — PROPOFOL 10 MG/ML IV BOLUS
INTRAVENOUS | Status: AC
  Filled 2021-12-16: qty 20

## 2021-12-16 MED ORDER — LIDOCAINE-EPINEPHRINE 0.5 %-1:200000 IJ SOLN
INTRAMUSCULAR | Status: AC
  Filled 2021-12-16: qty 1

## 2021-12-16 MED ORDER — PROPOFOL 10 MG/ML IV BOLUS
INTRAVENOUS | Status: DC | PRN
  Administered 2021-12-16: 20 mg via INTRAVENOUS

## 2021-12-16 MED ORDER — FENTANYL CITRATE (PF) 100 MCG/2ML IJ SOLN
INTRAMUSCULAR | Status: DC | PRN
  Administered 2021-12-16: 25 ug via INTRAVENOUS

## 2021-12-16 MED ORDER — CEFAZOLIN SODIUM-DEXTROSE 2-3 GM-%(50ML) IV SOLR
INTRAVENOUS | Status: DC | PRN
  Administered 2021-12-16: 2 g via INTRAVENOUS

## 2021-12-16 MED ORDER — OXYCODONE HCL 5 MG PO TABS: 5.0000 mg | ORAL_TABLET | Freq: Once | ORAL | Status: DC | PRN

## 2021-12-16 MED ORDER — PROPOFOL 500 MG/50ML IV EMUL
INTRAVENOUS | Status: DC | PRN
  Administered 2021-12-16: 75 ug/kg/min via INTRAVENOUS

## 2021-12-16 MED ORDER — LIDOCAINE-EPINEPHRINE 0.5 %-1:200000 IJ SOLN
INTRAMUSCULAR | Status: DC | PRN
  Administered 2021-12-16: 6 mL

## 2021-12-16 MED ORDER — ORAL CARE MOUTH RINSE: 15.0000 mL | Freq: Once | OROMUCOSAL | Status: AC

## 2021-12-16 MED ORDER — LIDOCAINE HCL (PF) 2 % IJ SOLN
INTRAMUSCULAR | Status: AC
  Filled 2021-12-16: qty 5

## 2021-12-16 MED ORDER — ONDANSETRON HCL 4 MG/2ML IJ SOLN
INTRAMUSCULAR | Status: AC
  Filled 2021-12-16: qty 2

## 2021-12-16 MED ORDER — 0.9 % SODIUM CHLORIDE (POUR BTL) OPTIME
TOPICAL | Status: DC | PRN
  Administered 2021-12-16: 1000 mL

## 2021-12-16 MED ORDER — SODIUM CHLORIDE 0.9 % IV SOLN: INTRAVENOUS | Status: DC

## 2021-12-16 MED ORDER — OXYCODONE-ACETAMINOPHEN 5-325 MG PO TABS: 1.0000 | ORAL_TABLET | Freq: Four times a day (QID) | ORAL | 0 refills | Status: DC | PRN

## 2021-12-16 SURGICAL SUPPLY — 38 items
ADH SKN CLS APL DERMABOND .7 (GAUZE/BANDAGES/DRESSINGS) ×1
ARMBAND PINK RESTRICT EXTREMIT (MISCELLANEOUS) ×1 IMPLANT
BAG HAMPER (MISCELLANEOUS) ×1 IMPLANT
CANNULA VESSEL 3MM 2 BLNT TIP (CANNULA) ×1 IMPLANT
CLIP LIGATING EXTRA MED SLVR (CLIP) ×1 IMPLANT
CLIP LIGATING EXTRA SM BLUE (MISCELLANEOUS) ×1 IMPLANT
COVER LIGHT HANDLE STERIS (MISCELLANEOUS) ×2 IMPLANT
COVER MAYO STAND XLG (MISCELLANEOUS) ×1 IMPLANT
DERMABOND ADVANCED .7 DNX12 (GAUZE/BANDAGES/DRESSINGS) ×1 IMPLANT
ELECT REM PT RETURN 9FT ADLT (ELECTROSURGICAL) ×1
ELECTRODE REM PT RTRN 9FT ADLT (ELECTROSURGICAL) ×1 IMPLANT
GAUZE SPONGE 4X4 12PLY STRL (GAUZE/BANDAGES/DRESSINGS) ×1 IMPLANT
GLOVE BIO SURGEON STRL SZ7 (GLOVE) IMPLANT
GLOVE BIOGEL PI IND STRL 7.0 (GLOVE) ×2 IMPLANT
GLOVE SS BIOGEL STRL SZ 6.5 (GLOVE) IMPLANT
GLOVE SURG MICRO LTX SZ7.5 (GLOVE) ×1 IMPLANT
GOWN STRL REUS W/TWL LRG LVL3 (GOWN DISPOSABLE) ×3 IMPLANT
IV NS 500ML (IV SOLUTION) ×1
IV NS 500ML BAXH (IV SOLUTION) ×1 IMPLANT
KIT BLADEGUARD II DBL (SET/KITS/TRAYS/PACK) ×1 IMPLANT
KIT TURNOVER KIT A (KITS) ×1 IMPLANT
MANIFOLD NEPTUNE II (INSTRUMENTS) ×1 IMPLANT
MARKER SKIN DUAL TIP RULER LAB (MISCELLANEOUS) ×2 IMPLANT
NDL HYPO 18GX1.5 BLUNT FILL (NEEDLE) ×1 IMPLANT
NEEDLE HYPO 18GX1.5 BLUNT FILL (NEEDLE) ×1 IMPLANT
NS IRRIG 1000ML POUR BTL (IV SOLUTION) ×1 IMPLANT
PACK CV ACCESS (CUSTOM PROCEDURE TRAY) ×1 IMPLANT
PAD ARMBOARD 7.5X6 YLW CONV (MISCELLANEOUS) ×1 IMPLANT
SET BASIN LINEN APH (SET/KITS/TRAYS/PACK) ×1 IMPLANT
SOL PREP POV-IOD 4OZ 10% (MISCELLANEOUS) ×1 IMPLANT
SOL PREP PROV IODINE SCRUB 4OZ (MISCELLANEOUS) ×1 IMPLANT
SUT PROLENE 6 0 CC (SUTURE) ×1 IMPLANT
SUT SILK 4 0 (SUTURE) ×1
SUT SILK 4-0 18XBRD TIE 12 (SUTURE) IMPLANT
SUT VIC AB 3-0 SH 27 (SUTURE) ×1
SUT VIC AB 3-0 SH 27X BRD (SUTURE) ×1 IMPLANT
SYR 10ML LL (SYRINGE) ×1 IMPLANT
UNDERPAD 30X36 HEAVY ABSORB (UNDERPADS AND DIAPERS) ×1 IMPLANT

## 2021-12-16 NOTE — Discharge Instructions (Signed)
[facility]  Discharge Instructions  AV Fistula or Graft Surgery for Dialysis Access  Please refer to the following instructions for your post-procedure care. Your surgeon or physician assistant will discuss any changes with you.  Activity  You may drive the day following your surgery, if you are comfortable and no longer taking prescription pain medication. Resume full activity as the soreness in your incision resolves.  Bathing/Showering  You may shower after you go home. Keep your incision dry for 48 hours. Do not soak in a bathtub, hot tub, or swim until the incision heals completely. You may not shower if you have a hemodialysis catheter.  Incision Care  Clean your incision with mild soap and water after 48 hours. Pat the area dry with a clean towel. You do not need a bandage unless otherwise instructed. Do not apply any ointments or creams to your incision. You may have skin glue on your incision. Do not peel it off. It will come off on its own in about one week. Your arm may swell a bit after surgery. To reduce swelling use pillows to elevate your arm so it is above your heart. Your doctor will tell you if you need to lightly wrap your arm with an ACE bandage.  Diet  Resume your normal diet. There are not special food restrictions following this procedure. In order to heal from your surgery, it is CRITICAL to get adequate nutrition. Your body requires vitamins, minerals, and protein. Vegetables are the best source of vitamins and minerals. Vegetables also provide the perfect balance of protein. Processed food has little nutritional value, so try to avoid this.  Medications  Resume taking all of your medications. If your incision is causing pain, you may take over-the counter pain relievers such as acetaminophen (Tylenol). If you were prescribed a stronger pain medication, please be aware these medications can cause nausea and constipation. Prevent  nausea by taking the medication with a snack or meal. Avoid constipation by drinking plenty of fluids and eating foods with high amount of fiber, such as fruits, vegetables, and grains.  Do not take Tylenol if you are taking prescription pain medications.  Follow up Your surgeon may want to see you in the office following your access surgery. If so, this will be arranged at the time of your surgery.  Please call us immediately for any of the following conditions:  Increased pain, redness, drainage (pus) from your incision site Fever of 101 degrees or higher Severe or worsening pain at your incision site Hand pain or numbness.  Reduce your risk of vascular disease:  Stop smoking. If you would like help, call [facility] at 1-800-QUIT-NOW (502)219-2849) or [place] at [place] your cholesterol Maintain a desired weight Control your diabetes Keep your blood pressure down  Dialysis  It will take several weeks to several months for your new dialysis access to be ready for use. Your surgeon will determine when it is okay to use it. Your nephrologist will continue to direct your dialysis. You can continue to use your Permcath until your new access is ready for use.   12/16/2021 Devin [name] [id number] May 24, 1945  Surgeon(s): Roald Lukacs, Arvilla Meres, MD  Procedure(s): LEFT ARM ARTERIOVENOUS (AV) FISTULA CREATION   May stick graft immediately   May stick graft on designated area only:    Do not stick fistula for 12 weeks    If you have any questions, please call the office at [phone].

## 2021-12-16 NOTE — H&P (Signed)
Office Visit  10/29/2021 [facility]  Angellynn Kimberlin, Arvilla Meres, MD Vascular Surgery CKD (chronic kidney disease) stage 4, GFR 15-29 ml/min (HCC) Dx Office visit ; Referred by Caryl Bis, MD Reason for Visit   Additional Documentation  Vitals:  BP 127/71 (BP Location: Right Arm, Patient Position: Sitting, Cuff Size: Normal) Pulse 60 Temp 98.4 F (36.9 C) (Temporal) Resp 16 Ht 5\' 9"  (1.753 m) Wt 103.6 kg SpO2 95% BMI 33.73 kg/m BSA 2.25 m  Flowsheets:  Clinical Intake, Vital Signs, NEWS, MEWS Score, Anthropometrics, Method of Visit   Encounter Info:  Billing Info, History, Allergies, Detailed Report    All Notes   Progress Notes by Rosetta Posner, MD at 10/29/2021 2:40 PM  Author: Rosetta Posner, MD Author Type: Physician Filed: 10/29/2021  4:15 PM  Note Status: Signed Cosign: Cosign Not Required Encounter Date: 10/29/2021  Editor: Rosetta Posner, MD (Physician)                                                   [facility] [place]   Patient name: Devin [name]  MRN: [id number]        DOB: Oct 12, 1945          Sex: male   REASON FOR CONSULT: Discuss access for hemodialysis   HPI: Devin [name] is a 76 y.o. male, who is here today for discussion of access for hemodialysis.  He is here today with his wife.  He has a long history of progressive renal insufficiency and is now approaching need for hemodialysis.  He does not have a pacemaker.  He is not on anticoagulant.  This renal insufficiency is felt to be due to diabetes and hypertension.       Past Medical History:  Diagnosis Date   Arthritis     CKD (chronic kidney disease) stage 4, GFR 15-29 ml/min (HCC)     Diabetes mellitus without complication ([place])     Gout     Hypertension        Family History  Family history unknown: Yes      SOCIAL HISTORY: Social History         Socioeconomic History   Marital status: Married      Spouse name: Not on file   Number of  children: Not on file   Years of education: Not on file   Highest education level: Not on file  Occupational History   Not on file  Tobacco Use   Smoking status: Former      Years: 20.00      Types: Cigarettes      Quit date: 51      Years since quitting: 30.6      Passive exposure: Past   Smokeless tobacco: Never  Substance and Sexual Activity   Alcohol use: Never   Drug use: Never   Sexual activity: Yes  Other Topics Concern   Not on file  Social History Narrative   Not on file    Social Determinants of Health    Financial Resource Strain: Not on file  Food Insecurity: Not on file  Transportation Needs: Not on file  Physical Activity: Not on file  Stress: Not on file  Social Connections: Not on file  Intimate Partner Violence: Not on file  No Known Allergies         Current Outpatient Medications  Medication Sig Dispense Refill   allopurinol (ZYLOPRIM) 300 MG tablet Take 300 mg by mouth 1 day or 1 dose.       amLODipine (NORVASC) 10 MG tablet Take 10 mg by mouth 1 day or 1 dose.       aspirin EC 81 MG tablet Take 81 mg by mouth daily. Swallow whole.       carvedilol (COREG) 12.5 MG tablet Take 12.5 mg by mouth 2 (two) times daily with a meal.       cloNIDine (CATAPRES) 0.3 MG tablet Take 0.3 mg by mouth in the morning, at noon, and at bedtime.       doxercalciferol (HECTOROL) 0.5 MCG capsule Take 0.5 mcg by mouth 3 (three) times a week.       furosemide (LASIX) 40 MG tablet Take 40 mg by mouth daily.       GLIPIZIDE XL 10 MG 24 hr tablet Take 10 mg by mouth daily.       hydrALAZINE (APRESOLINE) 100 MG tablet Take 100 mg by mouth in the morning, at noon, and at bedtime.       simvastatin (ZOCOR) 20 MG tablet Take 20 mg by mouth daily.        No current facility-administered medications for this visit.      REVIEW OF SYSTEMS:  [X]  denotes positive finding, [ ]  denotes negative finding Cardiac   Comments:  Chest pain or chest pressure:      Shortness of  breath upon exertion:      Short of breath when lying flat:      Irregular heart rhythm:             Vascular      Pain in calf, thigh, or hip brought on by ambulation:      Pain in feet at night that wakes you up from your sleep:       Blood clot in your veins:      Leg swelling:              Pulmonary      Oxygen at home:      Productive cough:       Wheezing:              Neurologic      Sudden weakness in arms or legs:       Sudden numbness in arms or legs:       Sudden onset of difficulty speaking or slurred speech:      Temporary loss of vision in one eye:       Problems with dizziness:              Gastrointestinal      Blood in stool:       Vomited blood:              Genitourinary      Burning when urinating:       Blood in urine:             Psychiatric      Major depression:              Hematologic      Bleeding problems:      Problems with blood clotting too easily:             Skin      Rashes or ulcers:  Constitutional      Fever or chills:          PHYSICAL EXAM:    Vitals:    10/29/21 1446  BP: 127/71  Pulse: 60  Resp: 16  Temp: 98.4 F (36.9 C)  TempSrc: Temporal  SpO2: 95%  Weight: 228 lb 6.4 oz (103.6 kg)  Height: 5\' 9"  (1.753 m)      GENERAL: The patient is a well-nourished male, in no acute distress. The vital signs are documented above. CARDIOVASCULAR: 2+ radial pulses bilaterally.  Easily visible cephalic vein at the wrist bilaterally. PULMONARY: There is good air exchange  MUSCULOSKELETAL: There are no major deformities or cyanosis. NEUROLOGIC: No focal weakness or paresthesias are detected. SKIN: There are no ulcers or rashes noted. PSYCHIATRIC: The patient has a normal affect.   DATA:  I did image his cephalic vein with SonoSite ultrasound bilaterally.  This does show good caliber cephalic vein bilaterally   MEDICAL ISSUES: Had long discussion with the patient regarding options for hemodialysis.  I discussed  tunneled catheter for acute dialysis.  Also discussed AV fistula and AV graft.  He does appear to be an excellent candidate for AV fistula creation.  I Splane the surgical procedure as an outpatient at [facility].  Also explained the potential for not maturation.  I explained also that the longer the fistula is in place the better it becomes for dialysis access and would recommend proceeding.  We will schedule this as an outpatient next week     Rosetta Posner, MD [facility] of [facility] [phone] Pager [phone]   Note: Portions of this report may have been transcribed using voice recognition software.  Every effort has been made to ensure accuracy; however, inadvertent computerized transcription errors may still be present.        Addendum:  The patient has been re-examined and re-evaluated.  The patient's history and physical has been reviewed and is unchanged.    Devin [name] is a 76 y.o. male is being admitted with CKD IV. All the risks, benefits and other treatment options have been discussed with the patient. The patient has consented to proceed with Procedure(s): LEFT ARM ARTERIOVENOUS (AV) FISTULA CREATION as a surgical intervention.  Devin [name] 12/16/2021 7:14 AM Vascular and Vein Surgery

## 2021-12-16 NOTE — Op Note (Signed)
    OPERATIVE REPORT  DATE OF SURGERY: 12/16/2021  PATIENT: Devin [name], 76 y.o. male MRN: [id number]  DOB: 07/18/1945  PRE-OPERATIVE DIAGNOSIS: Chronic renal insufficiency  POST-OPERATIVE DIAGNOSIS:  Same  PROCEDURE: Left brachiocephalic AV fistula creation  SURGEON:  Curt Jews, M.D.  PHYSICIAN ASSISTANT: Vevelyn Royals, RNFA  The assistant was needed for exposure and to expedite the case  ANESTHESIA: MAC  EBL: per anesthesia record  Total I/O In: -  Out: 10 [Blood:10]  BLOOD ADMINISTERED: none  DRAINS: none  SPECIMEN: none  COUNTS CORRECT:  YES  PATIENT DISPOSITION:  PACU - hemodynamically stable  PROCEDURE DETAILS: The patient was taken operating placed in supine position where the area of the left arm was imaged with SonoSite ultrasound.  Patient had a small cephalic vein at the wrist but moderate at the antecubital space and the upper arm.  The patient did have a moderate-sized basilic vein as well.  Decision was made to proceed with brachiocephalic fistula.  The left arm and left axilla were prepped and draped in usual sterile fashion.  Incision was made using local anesthesia over the antecubital space and carried down to isolate the cephalic vein which was of good caliber and the brachial artery which was of good caliber with minimal atherosclerotic change.  The tributary branches of the vein were ligated and divided.  The vein was ligated distally and divided and it was mobilized to the level of the brachial artery.  The brachial artery was occluded proximally distally and was opened with an 11 blade and sent longitudinally with Potts scissors.  The vein was cut to the appropriate length and was spatulated and sewn end-to-side to the artery with a running 6-0 Prolene suture.  Clamps were removed and excellent thrill was noted.  The wounds were irrigated with saline.  Hemostasis was obtained with electrocautery.  The wounds were closed with 3-0 Vicryl in the subcutaneous  and subcuticular tissue.  Dermabond was applied and the patient was transferred to the recovery room in stable condition   Rosetta Posner, M.D., [facility] 12/16/2021 9:26 AM  Note: Portions of this report may have been transcribed using voice recognition software.  Every effort has been made to ensure accuracy; however, inadvertent computerized transcription errors may still be present.

## 2021-12-16 NOTE — Anesthesia Preprocedure Evaluation (Signed)
Anesthesia Evaluation  Patient identified by MRN, date of birth, ID band Patient awake    Reviewed: Allergy & Precautions, H&P , NPO status , Patient's Chart, lab work & pertinent test results, reviewed documented beta blocker date and time   Airway Mallampati: II  TM Distance: >3 FB Neck ROM: full    Dental no notable dental hx.    Pulmonary neg pulmonary ROS, former smoker,    Pulmonary exam normal breath sounds clear to auscultation       Cardiovascular Exercise Tolerance: Good hypertension, negative cardio ROS   Rhythm:regular Rate:Normal     Neuro/Psych negative neurological ROS  negative psych ROS   GI/Hepatic negative GI ROS, Neg liver ROS,   Endo/Other  negative endocrine ROSdiabetes, Type 2  Renal/GU ESRF and CRFRenal disease  negative genitourinary   Musculoskeletal   Abdominal   Peds  Hematology negative hematology ROS (+)   Anesthesia Other Findings   Reproductive/Obstetrics negative OB ROS                             Anesthesia Physical Anesthesia Plan  ASA: 3  Anesthesia Plan: General   Post-op Pain Management:    Induction:   PONV Risk Score and Plan: Ondansetron  Airway Management Planned:   Additional Equipment:   Intra-op Plan:   Post-operative Plan:   Informed Consent: I have reviewed the patients History and Physical, chart, labs and discussed the procedure including the risks, benefits and alternatives for the proposed anesthesia with the patient or authorized representative who has indicated his/her understanding and acceptance.     Dental Advisory Given  Plan Discussed with: CRNA  Anesthesia Plan Comments:         Anesthesia Quick Evaluation

## 2021-12-16 NOTE — Transfer of Care (Signed)
Immediate Anesthesia Transfer of Care Note  Patient: Devin [name]  Procedure(s) Performed: LEFT ARM ARTERIOVENOUS (AV) FISTULA CREATION (Left: Arm Lower)  Patient Location: PACU  Anesthesia Type:General  Level of Consciousness: awake  Airway & Oxygen Therapy: Patient Spontanous Breathing  Post-op Assessment: Report given to RN  Post vital signs: Reviewed and stable  Last Vitals:  Vitals Value Taken Time  BP 150/72 12/16/21 0905  Temp    Pulse 69 12/16/21 0911  Resp 18 12/16/21 0911  SpO2 97 % 12/16/21 0911  Vitals shown include unvalidated device data.  Last Pain:  Vitals:   12/16/21 0703  TempSrc: Oral  PainSc: 0-No pain      Patients Stated Pain Goal: 5 (82/95/62 1308)  Complications: No notable events documented.

## 2021-12-17 NOTE — Anesthesia Postprocedure Evaluation (Signed)
Anesthesia Post Note  Patient: Cato Liburd  Procedure(s) Performed: LEFT ARM ARTERIOVENOUS (AV) FISTULA CREATION (Left: Arm Lower)  Patient location during evaluation: Phase II Anesthesia Type: General Level of consciousness: awake Pain management: pain level controlled Vital Signs Assessment: post-procedure vital signs reviewed and stable Respiratory status: spontaneous breathing and respiratory function stable Cardiovascular status: blood pressure returned to baseline and stable Postop Assessment: no headache and no apparent nausea or vomiting Anesthetic complications: no Comments: Late entry   No notable events documented.   Last Vitals:  Vitals:   12/16/21 0930 12/16/21 0948  BP: (!) 148/81 (!) 156/74  Pulse: 70 79  Resp: 14 16  Temp:  36.6 C  SpO2: 97% 94%    Last Pain:  Vitals:   12/16/21 0948  TempSrc: Oral  PainSc: 0-No pain                 Louann Sjogren

## 2021-12-23 ENCOUNTER — Encounter (HOSPITAL_COMMUNITY): Payer: Self-pay | Admitting: Vascular Surgery

## 2021-12-28 DIAGNOSIS — Z23 Encounter for immunization: Secondary | ICD-10-CM | POA: Diagnosis not present

## 2021-12-28 DIAGNOSIS — N184 Chronic kidney disease, stage 4 (severe): Secondary | ICD-10-CM | POA: Diagnosis not present

## 2021-12-28 DIAGNOSIS — E1122 Type 2 diabetes mellitus with diabetic chronic kidney disease: Secondary | ICD-10-CM | POA: Diagnosis not present

## 2021-12-28 DIAGNOSIS — J690 Pneumonitis due to inhalation of food and vomit: Secondary | ICD-10-CM | POA: Diagnosis not present

## 2021-12-28 DIAGNOSIS — N2581 Secondary hyperparathyroidism of renal origin: Secondary | ICD-10-CM | POA: Diagnosis not present

## 2021-12-30 ENCOUNTER — Encounter (INDEPENDENT_AMBULATORY_CARE_PROVIDER_SITE_OTHER): Payer: Self-pay | Admitting: Gastroenterology

## 2021-12-30 ENCOUNTER — Ambulatory Visit (INDEPENDENT_AMBULATORY_CARE_PROVIDER_SITE_OTHER): Payer: Medicare Other | Admitting: Gastroenterology

## 2022-01-06 ENCOUNTER — Other Ambulatory Visit: Payer: Self-pay | Admitting: *Deleted

## 2022-01-06 DIAGNOSIS — N184 Chronic kidney disease, stage 4 (severe): Secondary | ICD-10-CM

## 2022-01-09 ENCOUNTER — Encounter (INDEPENDENT_AMBULATORY_CARE_PROVIDER_SITE_OTHER): Payer: Self-pay | Admitting: *Deleted

## 2022-01-13 DIAGNOSIS — N184 Chronic kidney disease, stage 4 (severe): Secondary | ICD-10-CM | POA: Diagnosis not present

## 2022-01-13 DIAGNOSIS — E1122 Type 2 diabetes mellitus with diabetic chronic kidney disease: Secondary | ICD-10-CM | POA: Diagnosis not present

## 2022-01-19 DIAGNOSIS — I770 Arteriovenous fistula, acquired: Secondary | ICD-10-CM | POA: Diagnosis not present

## 2022-01-19 DIAGNOSIS — Z7689 Persons encountering health services in other specified circumstances: Secondary | ICD-10-CM | POA: Diagnosis not present

## 2022-01-19 DIAGNOSIS — D508 Other iron deficiency anemias: Secondary | ICD-10-CM | POA: Diagnosis not present

## 2022-01-19 DIAGNOSIS — D631 Anemia in chronic kidney disease: Secondary | ICD-10-CM | POA: Diagnosis not present

## 2022-01-19 DIAGNOSIS — I5032 Chronic diastolic (congestive) heart failure: Secondary | ICD-10-CM | POA: Diagnosis not present

## 2022-01-19 DIAGNOSIS — N189 Chronic kidney disease, unspecified: Secondary | ICD-10-CM | POA: Diagnosis not present

## 2022-01-19 DIAGNOSIS — R9341 Abnormal radiologic findings on diagnostic imaging of renal pelvis, ureter, or bladder: Secondary | ICD-10-CM | POA: Diagnosis not present

## 2022-01-19 DIAGNOSIS — I129 Hypertensive chronic kidney disease with stage 1 through stage 4 chronic kidney disease, or unspecified chronic kidney disease: Secondary | ICD-10-CM | POA: Diagnosis not present

## 2022-01-19 DIAGNOSIS — N320 Bladder-neck obstruction: Secondary | ICD-10-CM | POA: Diagnosis not present

## 2022-01-19 DIAGNOSIS — R809 Proteinuria, unspecified: Secondary | ICD-10-CM | POA: Diagnosis not present

## 2022-01-19 DIAGNOSIS — E211 Secondary hyperparathyroidism, not elsewhere classified: Secondary | ICD-10-CM | POA: Diagnosis not present

## 2022-01-20 DIAGNOSIS — I10 Essential (primary) hypertension: Secondary | ICD-10-CM | POA: Diagnosis not present

## 2022-01-20 DIAGNOSIS — N184 Chronic kidney disease, stage 4 (severe): Secondary | ICD-10-CM | POA: Diagnosis not present

## 2022-01-20 DIAGNOSIS — E782 Mixed hyperlipidemia: Secondary | ICD-10-CM | POA: Diagnosis not present

## 2022-01-21 ENCOUNTER — Ambulatory Visit (INDEPENDENT_AMBULATORY_CARE_PROVIDER_SITE_OTHER): Payer: Medicare Other | Admitting: Vascular Surgery

## 2022-01-21 ENCOUNTER — Ambulatory Visit (INDEPENDENT_AMBULATORY_CARE_PROVIDER_SITE_OTHER): Payer: Medicare Other

## 2022-01-21 ENCOUNTER — Encounter: Payer: Self-pay | Admitting: Vascular Surgery

## 2022-01-21 VITALS — BP 162/63 | HR 74 | Temp 98.2°F | Ht 69.0 in | Wt 220.4 lb

## 2022-01-21 DIAGNOSIS — N184 Chronic kidney disease, stage 4 (severe): Secondary | ICD-10-CM

## 2022-01-21 NOTE — Progress Notes (Signed)
[facility]in [facility]ist of [place]  Patient name: Devin [name] MRN: [id number] DOB: 02/07/1946 Sex: male  REASON FOR VISIT: Follow-up left brachiocephalic AV fistula creation on 12/16/2021  HPI: Devin [name] is a 76 y.o. male here today for follow-up.  He is here with his wife.  He has CKD 4.  He does report that he is having increased shortness of breath and increased edema.  He saw Dr. Theador Hawthorne earlier this week and has had his Lasix increased.  He denies any steal symptoms.  Specifically denies any weakness, numbness or coolness in his left hand.  Current Outpatient Medications  Medication Sig Dispense Refill   allopurinol (ZYLOPRIM) 300 MG tablet Take 300 mg by mouth daily.     amLODipine (NORVASC) 10 MG tablet Take 10 mg by mouth daily.     aspirin EC 81 MG tablet Take 81 mg by mouth daily. Swallow whole.     carvedilol (COREG) 12.5 MG tablet Take 12.5 mg by mouth 2 (two) times daily with a meal.     cloNIDine (CATAPRES) 0.3 MG tablet Take 0.3 mg by mouth in the morning, at noon, and at bedtime.     Cyanocobalamin 1000 MCG SUBL Place under the tongue.     doxercalciferol (HECTOROL) 0.5 MCG capsule Take 0.5 mcg by mouth 3 (three) times a week.     epoetin alfa (EPOGEN) 3000 UNIT/ML injection Inject into the skin.     furosemide (LASIX) 40 MG tablet Take 40 mg by mouth daily.     glipiZIDE (GLUCOTROL XL) 10 MG 24 hr tablet Take 10 mg by mouth daily.     hydrALAZINE (APRESOLINE) 100 MG tablet Take 100 mg by mouth in the morning, at noon, and at bedtime.     oxyCODONE-acetaminophen (PERCOCET) 5-325 MG tablet Take 1 tablet by mouth every 6 (six) hours as needed for severe pain. 8 tablet 0   simvastatin (ZOCOR) 20 MG tablet Take 20 mg by mouth daily.     Vitamin D, Cholecalciferol, 25 MCG (1000 UT) CAPS Take by mouth.     linagliptin (TRADJENTA) 5 MG TABS tablet Take 1 tablet (5 mg total) by mouth daily. 30 tablet 0   No current  facility-administered medications for this visit.     PHYSICAL EXAM: Vitals:   01/21/22 1331  BP: (!) 162/63  Pulse: 74  Temp: 98.2 F (36.8 C)  SpO2: 94%  Weight: 220 lb 6.4 oz (100 kg)  Height: 5\' 9"  (1.753 m)    GENERAL: The patient is a well-nourished male, in no acute distress. The vital signs are documented above. Excellent thrill in his left upper arm AV fistula.  2-3+ left radial pulse.  His vein does run relatively superficial above the antecubital space and somewhat deeper in the upper arm.  Plex reveals patency of his fistula with diameter minimum of 4-1/2 mm.  MEDICAL ISSUES: Patent left brachiocephalic fistula 1 month postop.  He will exercise his hand with a squeeze ball.  I explained that he would require a minimum of 3 months before using the fistula.  Also explained that he may not have adequate maturation for use and further procedures may be required.  He will continue his follow-up with Dr. Theador Hawthorne and see [place] on an as-needed basis   Rosetta Posner, MD [facility] V[facility] and Vein Specialists of [facility] Tel [phone]  Note: Portions of this report may have been transcribed using voice recognition software.  Every effort has been made to ensure  accuracy; however, inadvertent computerized transcription errors may still be present.

## 2022-01-22 ENCOUNTER — Encounter (INDEPENDENT_AMBULATORY_CARE_PROVIDER_SITE_OTHER): Payer: Self-pay | Admitting: *Deleted

## 2022-02-20 ENCOUNTER — Inpatient Hospital Stay: Payer: Medicare Other | Attending: Hematology | Admitting: Hematology

## 2022-02-20 ENCOUNTER — Inpatient Hospital Stay: Payer: Medicare Other

## 2022-02-20 ENCOUNTER — Encounter: Payer: Self-pay | Admitting: Hematology

## 2022-02-20 VITALS — BP 152/68 | HR 72 | Temp 98.2°F | Resp 16 | Ht 69.0 in | Wt 203.9 lb

## 2022-02-20 DIAGNOSIS — N184 Chronic kidney disease, stage 4 (severe): Secondary | ICD-10-CM | POA: Insufficient documentation

## 2022-02-20 DIAGNOSIS — Z7982 Long term (current) use of aspirin: Secondary | ICD-10-CM | POA: Insufficient documentation

## 2022-02-20 DIAGNOSIS — Z87891 Personal history of nicotine dependence: Secondary | ICD-10-CM | POA: Insufficient documentation

## 2022-02-20 DIAGNOSIS — Z8042 Family history of malignant neoplasm of prostate: Secondary | ICD-10-CM | POA: Insufficient documentation

## 2022-02-20 DIAGNOSIS — E538 Deficiency of other specified B group vitamins: Secondary | ICD-10-CM | POA: Diagnosis not present

## 2022-02-20 DIAGNOSIS — Z79899 Other long term (current) drug therapy: Secondary | ICD-10-CM | POA: Insufficient documentation

## 2022-02-20 DIAGNOSIS — D631 Anemia in chronic kidney disease: Secondary | ICD-10-CM | POA: Insufficient documentation

## 2022-02-20 DIAGNOSIS — D649 Anemia, unspecified: Secondary | ICD-10-CM

## 2022-02-20 LAB — CBC WITH DIFFERENTIAL/PLATELET
Abs Immature Granulocytes: 0.02 10*3/uL (ref 0.00–0.07)
Basophils Absolute: 0.1 10*3/uL (ref 0.0–0.1)
Basophils Relative: 1 %
Eosinophils Absolute: 0.4 10*3/uL (ref 0.0–0.5)
Eosinophils Relative: 5 %
HCT: 32.8 % — ABNORMAL LOW (ref 39.0–52.0)
Hemoglobin: 10.3 g/dL — ABNORMAL LOW (ref 13.0–17.0)
Immature Granulocytes: 0 %
Lymphocytes Relative: 23 %
Lymphs Abs: 2 10*3/uL (ref 0.7–4.0)
MCH: 29.1 pg (ref 26.0–34.0)
MCHC: 31.4 g/dL (ref 30.0–36.0)
MCV: 92.7 fL (ref 80.0–100.0)
Monocytes Absolute: 0.5 10*3/uL (ref 0.1–1.0)
Monocytes Relative: 6 %
Neutro Abs: 5.6 10*3/uL (ref 1.7–7.7)
Neutrophils Relative %: 65 %
Platelets: 322 10*3/uL (ref 150–400)
RBC: 3.54 MIL/uL — ABNORMAL LOW (ref 4.22–5.81)
RDW: 16 % — ABNORMAL HIGH (ref 11.5–15.5)
WBC: 8.5 10*3/uL (ref 4.0–10.5)
nRBC: 0 % (ref 0.0–0.2)

## 2022-02-20 LAB — FERRITIN: Ferritin: 299 ng/mL (ref 24–336)

## 2022-02-20 LAB — LACTATE DEHYDROGENASE: LDH: 159 U/L (ref 98–192)

## 2022-02-20 LAB — IRON AND TIBC
Iron: 50 ug/dL (ref 45–182)
Saturation Ratios: 17 % — ABNORMAL LOW (ref 17.9–39.5)
TIBC: 297 ug/dL (ref 250–450)
UIBC: 247 ug/dL

## 2022-02-20 LAB — RETICULOCYTES
Immature Retic Fract: 6.7 % (ref 2.3–15.9)
RBC.: 3.61 MIL/uL — ABNORMAL LOW (ref 4.22–5.81)
Retic Count, Absolute: 43 10*3/uL (ref 19.0–186.0)
Retic Ct Pct: 1.2 % (ref 0.4–3.1)

## 2022-02-20 LAB — FOLATE: Folate: 7.3 ng/mL (ref >5.9)

## 2022-02-20 LAB — VITAMIN B12: Vitamin B-12: 206 pg/mL (ref 180–914)

## 2022-02-20 NOTE — Progress Notes (Signed)
CONSULT NOTE  Patient Care Team: Caryl Bis, MD as PCP - General (Family Medicine)  CHIEF COMPLAINTS/PURPOSE OF CONSULTATION:  Normocytic anemia.  HISTORY OF PRESENTING ILLNESS:  Devin [name] 76 y.o. male is seen at the request of Dr. Theador Hawthorne for further workup and management of normocytic anemia.  He has history of normocytic anemia and received parenteral iron therapy in June 2023 for a total of 1 g dose.  Denies any prior blood transfusions.  Denies any bleeding per rectum or melena.  Denies any ice pica.  He also has history of B12 deficiency for which she takes B12 1 mg tablet daily.  CBC on 12/15/2021 showed hemoglobin 10.5 with MCV of 88.  PLT was 393 and WBC was 10.9.  He reports energy levels of 75%.  He is able to do all his ADLs and IADLs.  He is a retired Lawyer.  MEDICAL HISTORY:  Past Medical History:  Diagnosis Date   Arthritis    CKD (chronic kidney disease) stage 4, GFR 15-29 ml/min (HCC)    Diabetes mellitus without complication ([place])    Gout    Hypertension     SURGICAL HISTORY: Past Surgical History:  Procedure Laterality Date   AV FISTULA PLACEMENT Left 12/16/2021   Procedure: LEFT ARM ARTERIOVENOUS (AV) FISTULA CREATION;  Surgeon: Rosetta Posner, MD;  Location: [facility];  Service: Vascular;  Laterality: Left;   KNEE SURGERY     TONSILLECTOMY      SOCIAL HISTORY: Social History   Socioeconomic History   Marital status: Married    Spouse name: Not on file   Number of children: Not on file   Years of education: Not on file   Highest education level: Not on file  Occupational History   Not on file  Tobacco Use   Smoking status: Former    Years: 20.00    Types: Cigarettes    Quit date: 26    Years since quitting: 30.9    Passive exposure: Past   Smokeless tobacco: Never  Vaping Use   Vaping Use: Never used  Substance and Sexual Activity   Alcohol use: Never   Drug use: Never   Sexual activity: Yes  Other Topics Concern   Not  on file  Social History Narrative   Not on file   Social Determinants of Health   Financial Resource Strain: Not on file  Food Insecurity: Not on file  Transportation Needs: Not on file  Physical Activity: Not on file  Stress: Not on file  Social Connections: Not on file  Intimate Partner Violence: Not on file    FAMILY HISTORY: Family History  Family history unknown: Yes    ALLERGIES:  has No Known Allergies.  MEDICATIONS:  Current Outpatient Medications  Medication Sig Dispense Refill   allopurinol (ZYLOPRIM) 300 MG tablet Take 300 mg by mouth daily.     amLODipine (NORVASC) 10 MG tablet Take 10 mg by mouth daily.     aspirin EC 81 MG tablet Take 81 mg by mouth daily. Swallow whole.     carvedilol (COREG) 12.5 MG tablet Take 12.5 mg by mouth 2 (two) times daily with a meal.     cloNIDine (CATAPRES) 0.3 MG tablet Take 0.3 mg by mouth in the morning, at noon, and at bedtime.     Cyanocobalamin 1000 MCG SUBL Place under the tongue.     doxercalciferol (HECTOROL) 0.5 MCG capsule Take 0.5 mcg by mouth 3 (three) times a week.  epoetin alfa (EPOGEN) 3000 UNIT/ML injection Inject into the skin.     furosemide (LASIX) 40 MG tablet Take 40 mg by mouth daily.     glipiZIDE (GLUCOTROL XL) 10 MG 24 hr tablet Take 10 mg by mouth daily.     hydrALAZINE (APRESOLINE) 100 MG tablet Take 100 mg by mouth in the morning, at noon, and at bedtime.     oxyCODONE-acetaminophen (PERCOCET) 5-325 MG tablet Take 1 tablet by mouth every 6 (six) hours as needed for severe pain. 8 tablet 0   simvastatin (ZOCOR) 20 MG tablet Take 20 mg by mouth daily.     sodium bicarbonate 650 MG tablet Take by mouth.     Vitamin D, Cholecalciferol, 25 MCG (1000 UT) CAPS Take by mouth.     linagliptin (TRADJENTA) 5 MG TABS tablet Take 1 tablet (5 mg total) by mouth daily. 30 tablet 0   No current facility-administered medications for this visit.    REVIEW OF SYSTEMS:   Constitutional: Denies fevers, chills or  abnormal night sweats Eyes: Denies blurriness of vision, double vision or watery eyes Ears, nose, mouth, throat, and face: Denies mucositis or sore throat Respiratory: Denies cough, dyspnea or wheezes Cardiovascular: Denies palpitation, chest discomfort or lower extremity swelling Gastrointestinal:  Denies nausea, heartburn or change in bowel habits Skin: Denies abnormal skin rashes Lymphatics: Denies new lymphadenopathy or easy bruising Neurological:Denies numbness, tingling or new weaknesses Behavioral/Psych: Mood is stable, no new changes  All other systems were reviewed with the patient and are negative.  PHYSICAL EXAMINATION: ECOG PERFORMANCE STATUS: 1 - Symptomatic but completely ambulatory  Vitals:   02/20/22 1117  BP: (!) 152/68  Pulse: 72  Resp: 16  Temp: 98.2 F (36.8 C)  SpO2: 99%   Filed Weights   02/20/22 1117  Weight: 203 lb 14.8 oz (92.5 kg)    GENERAL:alert, no distress and comfortable SKIN: skin color, texture, turgor are normal, no rashes or significant lesions EYES: normal, conjunctiva are pink and non-injected, sclera clear OROPHARYNX:no exudate, no erythema and lips, buccal mucosa, and tongue normal  NECK: supple, thyroid normal size, non-tender, without nodularity LYMPH:  no palpable lymphadenopathy in the cervical, axillary or inguinal LUNGS: clear to auscultation and percussion with normal breathing effort HEART: regular rate & rhythm and no murmurs and no lower extremity edema ABDOMEN:abdomen soft, non-tender and normal bowel sounds Musculoskeletal:no cyanosis of digits and no clubbing  PSYCH: alert & oriented x 3 with fluent speech NEURO: no focal motor/sensory deficits  LABORATORY DATA:  I have reviewed the data as listed Recent Results (from the past 2160 hour(s))  CBG monitoring, ED     Status: Abnormal   Collection Time: 11/25/21 12:32 AM  Result Value Ref Range   Glucose-Capillary 113 (H) 70 - 99 mg/dL    Comment: Glucose reference range  applies only to samples taken after fasting for at least 8 hours.  TSH     Status: None   Collection Time: 11/25/21  1:51 AM  Result Value Ref Range   TSH 3.239 0.350 - 4.500 uIU/mL    Comment: Performed by a 3rd Generation assay with a functional sensitivity of <=0.01 uIU/mL. Performed at [facility], [address], [place], [place] [address]   Blood Culture (routine x 2)     Status: None   Collection Time: 11/25/21  1:54 AM   Specimen: BLOOD  Result Value Ref Range   Specimen Description BLOOD RIGHT ANTECUBITAL    Special Requests  BOTTLES DRAWN AEROBIC AND ANAEROBIC Blood Culture adequate volume   Culture      NO GROWTH 5 DAYS Performed at [facility], [address] [place], [place] [address]    Report Status 11/30/2021 FINAL   Lactic acid, plasma     Status: None   Collection Time: 11/25/21  1:57 AM  Result Value Ref Range   Lactic Acid, Venous 1.5 0.5 - 1.9 mmol/L    Comment: Performed at [facility], [address], [place], [place] [address]  Comprehensive metabolic panel     Status: Abnormal   Collection Time: 11/25/21  1:57 AM  Result Value Ref Range   Sodium 142 135 - 145 mmol/L   Potassium 4.0 3.5 - 5.1 mmol/L   Chloride 112 (H) 98 - 111 mmol/L   CO2 22 22 - 32 mmol/L   Glucose, Bld 61 (L) 70 - 99 mg/dL    Comment: Glucose reference range applies only to samples taken after fasting for at least 8 hours.   BUN 41 (H) 8 - 23 mg/dL   Creatinine, Ser 4.29 (H) 0.61 - 1.24 mg/dL   Calcium 9.0 8.9 - 10.3 mg/dL   Total Protein 8.3 (H) 6.5 - 8.1 g/dL   Albumin 3.9 3.5 - 5.0 g/dL   AST 30 15 - 41 U/L   ALT 20 0 - 44 U/L   Alkaline Phosphatase 87 38 - 126 U/L   Total Bilirubin 0.4 0.3 - 1.2 mg/dL   GFR, Estimated 13 (L) >60 mL/min    Comment: (NOTE) Calculated using the CKD-EPI Creatinine Equation (2021)    Anion gap 8 5 - 15    Comment: Performed at [facility], [address] [place], [place] [address]  CBC with Differential     Status: Abnormal    Collection Time: 11/25/21  1:57 AM  Result Value Ref Range   WBC 9.4 4.0 - 10.5 K/uL   RBC 3.54 (L) 4.22 - 5.81 MIL/uL   Hemoglobin 10.6 (L) 13.0 - 17.0 g/dL   HCT 32.5 (L) 39.0 - 52.0 %   MCV 91.8 80.0 - 100.0 fL   MCH 29.9 26.0 - 34.0 pg   MCHC 32.6 30.0 - 36.0 g/dL   RDW 15.7 (H) 11.5 - 15.5 %   Platelets 340 150 - 400 K/uL   nRBC 0.0 0.0 - 0.2 %   Neutrophils Relative % 83 %   Neutro Abs 7.8 (H) 1.7 - 7.7 K/uL   Lymphocytes Relative 11 %   Lymphs Abs 1.0 0.7 - 4.0 K/uL   Monocytes Relative 4 %   Monocytes Absolute 0.4 0.1 - 1.0 K/uL   Eosinophils Relative 2 %   Eosinophils Absolute 0.2 0.0 - 0.5 K/uL   Basophils Relative 0 %   Basophils Absolute 0.0 0.0 - 0.1 K/uL   Immature Granulocytes 0 %   Abs Immature Granulocytes 0.04 0.00 - 0.07 K/uL    Comment: Performed at [facility], [address], [place], [place] [address]  Protime-INR     Status: None   Collection Time: 11/25/21  1:57 AM  Result Value Ref Range   Prothrombin Time 14.0 11.4 - 15.2 seconds   INR 1.1 0.8 - 1.2    Comment: (NOTE) INR goal varies based on device and disease states. Performed at [facility], [address] [place], [place] [address]   APTT     Status: None   Collection Time: 11/25/21  1:57 AM  Result Value Ref Range   aPTT 30  24 - 36 seconds    Comment: Performed at [facility], [address], [place], [place] [address]  Blood Culture (routine x 2)     Status: None   Collection Time: 11/25/21  1:57 AM   Specimen: BLOOD  Result Value Ref Range   Specimen Description BLOOD LEFT ANTECUBITAL    Special Requests      BOTTLES DRAWN AEROBIC AND ANAEROBIC Blood Culture adequate volume   Culture      NO GROWTH 5 DAYS Performed at [facility], [address], [place], [place] [address]    Report Status 11/30/2021 FINAL   SARS Coronavirus 2 by RT PCR (hospital order, performed in [facility] hospital lab) *cepheid single result test* Anterior Nasal Swab     Status: None   Collection Time:  11/25/21  2:52 AM   Specimen: Anterior Nasal Swab  Result Value Ref Range   SARS Coronavirus 2 by RT PCR NEGATIVE NEGATIVE    Comment: (NOTE) SARS-CoV-2 target nucleic acids are NOT DETECTED.  The SARS-CoV-2 RNA is generally detectable in upper and lower respiratory specimens during the acute phase of infection. The lowest concentration of SARS-CoV-2 viral copies this assay can detect is 250 copies / mL. A negative result does not preclude SARS-CoV-2 infection and should not be used as the sole basis for treatment or other patient management decisions.  A negative result may occur with improper specimen collection / handling, submission of specimen other than nasopharyngeal swab, presence of viral mutation(s) within the areas targeted by this assay, and inadequate number of viral copies (<250 copies / mL). A negative result must be combined with clinical observations, patient history, and epidemiological information.  Fact Sheet for Patients:   https://www.patel.info/  Fact Sheet for Healthcare Providers: https://hall.com/  This test is not yet approved or  cleared by the [place] FDA and has been authorized for detection and/or diagnosis of SARS-CoV-2 by FDA under an Emergency Use Authorization (EUA).  This EUA will remain in effect (meaning this test can be used) for the duration of the COVID-19 declaration under Section 564(b)(1) of the Act, 21 U.S.C. section 360bbb-3(b)(1), unless the authorization is terminated or revoked sooner.  Performed at [facility], [address], [place], [place] [address]   POC CBG, ED     Status: Abnormal   Collection Time: 11/25/21  2:55 AM  Result Value Ref Range   Glucose-Capillary 37 (LL) 70 - 99 mg/dL    Comment: Glucose reference range applies only to samples taken after fasting for at least 8 hours.   Comment 1 Notify RN   CBG monitoring, ED     Status: Abnormal   Collection Time: 11/25/21   2:56 AM  Result Value Ref Range   Glucose-Capillary 37 (LL) 70 - 99 mg/dL    Comment: Glucose reference range applies only to samples taken after fasting for at least 8 hours.   Comment 1 Notify RN   POC CBG, ED     Status: Abnormal   Collection Time: 11/25/21  3:13 AM  Result Value Ref Range   Glucose-Capillary 140 (H) 70 - 99 mg/dL    Comment: Glucose reference range applies only to samples taken after fasting for at least 8 hours.  POC CBG, ED     Status: None   Collection Time: 11/25/21  3:59 AM  Result Value Ref Range   Glucose-Capillary 71 70 - 99 mg/dL    Comment: Glucose reference range applies only to samples taken after fasting  for at least 8 hours.  Procalcitonin - Baseline     Status: None   Collection Time: 11/25/21  4:00 AM  Result Value Ref Range   Procalcitonin <0.10 ng/mL    Comment:        Interpretation: PCT (Procalcitonin) <= 0.5 ng/mL: Systemic infection (sepsis) is not likely. Local bacterial infection is possible. (NOTE)       Sepsis PCT Algorithm           Lower Respiratory Tract                                      Infection PCT Algorithm    ----------------------------     ----------------------------         PCT < 0.25 ng/mL                PCT < 0.10 ng/mL          Strongly encourage             Strongly discourage   discontinuation of antibiotics    initiation of antibiotics    ----------------------------     -----------------------------       PCT 0.25 - 0.50 ng/mL            PCT 0.10 - 0.25 ng/mL               OR       >80% decrease in PCT            Discourage initiation of                                            antibiotics      Encourage discontinuation           of antibiotics    ----------------------------     -----------------------------         PCT >= 0.50 ng/mL              PCT 0.26 - 0.50 ng/mL               AND        <80% decrease in PCT             Encourage initiation of                                              antibiotics       Encourage continuation           of antibiotics    ----------------------------     -----------------------------        PCT >= 0.50 ng/mL                  PCT > 0.50 ng/mL               AND         increase in PCT                  Strongly encourage  initiation of antibiotics    Strongly encourage escalation           of antibiotics                                     -----------------------------                                           PCT <= 0.25 ng/mL                                                 OR                                        > 80% decrease in PCT                                      Discontinue / Do not initiate                                             antibiotics  Performed at [facility], [address] [place], [place] [address]   Lactic acid, plasma     Status: None   Collection Time: 11/25/21  4:26 AM  Result Value Ref Range   Lactic Acid, Venous 1.4 0.5 - 1.9 mmol/L    Comment: Performed at [facility], [address], [place], [place] [address]  Comprehensive metabolic panel     Status: Abnormal   Collection Time: 11/25/21  4:26 AM  Result Value Ref Range   Sodium 140 135 - 145 mmol/L   Potassium 3.7 3.5 - 5.1 mmol/L   Chloride 112 (H) 98 - 111 mmol/L   CO2 19 (L) 22 - 32 mmol/L   Glucose, Bld 78 70 - 99 mg/dL    Comment: Glucose reference range applies only to samples taken after fasting for at least 8 hours.   BUN 40 (H) 8 - 23 mg/dL   Creatinine, Ser 4.19 (H) 0.61 - 1.24 mg/dL   Calcium 8.6 (L) 8.9 - 10.3 mg/dL   Total Protein 7.7 6.5 - 8.1 g/dL   Albumin 3.6 3.5 - 5.0 g/dL   AST 27 15 - 41 U/L   ALT 16 0 - 44 U/L   Alkaline Phosphatase 79 38 - 126 U/L   Total Bilirubin 0.5 0.3 - 1.2 mg/dL   GFR, Estimated 14 (L) >60 mL/min    Comment: (NOTE) Calculated using the CKD-EPI Creatinine Equation (2021)    Anion gap 9 5 - 15    Comment: Performed at [facility], [address],  [place], [place] [address]  Magnesium     Status: None   Collection Time: 11/25/21  4:26 AM  Result Value Ref Range   Magnesium 2.3 1.7 - 2.4 mg/dL    Comment: Performed at [facility], [address] [place], [place] [address]  CBC  with Differential/Platelet     Status: Abnormal   Collection Time: 11/25/21  4:26 AM  Result Value Ref Range   WBC 9.1 4.0 - 10.5 K/uL   RBC 3.45 (L) 4.22 - 5.81 MIL/uL   Hemoglobin 10.4 (L) 13.0 - 17.0 g/dL   HCT 32.8 (L) 39.0 - 52.0 %   MCV 95.1 80.0 - 100.0 fL   MCH 30.1 26.0 - 34.0 pg   MCHC 31.7 30.0 - 36.0 g/dL   RDW 15.9 (H) 11.5 - 15.5 %   Platelets 317 150 - 400 K/uL   nRBC 0.0 0.0 - 0.2 %   Neutrophils Relative % 74 %   Neutro Abs 6.8 1.7 - 7.7 K/uL   Lymphocytes Relative 16 %   Lymphs Abs 1.4 0.7 - 4.0 K/uL   Monocytes Relative 7 %   Monocytes Absolute 0.6 0.1 - 1.0 K/uL   Eosinophils Relative 2 %   Eosinophils Absolute 0.2 0.0 - 0.5 K/uL   Basophils Relative 1 %   Basophils Absolute 0.1 0.0 - 0.1 K/uL   Immature Granulocytes 0 %   Abs Immature Granulocytes 0.04 0.00 - 0.07 K/uL    Comment: Performed at [facility], [address], [place], [place] [address]  Hemoglobin A1c     Status: Abnormal   Collection Time: 11/25/21  4:26 AM  Result Value Ref Range   Hgb A1c MFr Bld 6.0 (H) 4.8 - 5.6 %    Comment: (NOTE) Pre diabetes:          5.7%-6.4%  Diabetes:              >6.4%  Glycemic control for   <7.0% adults with diabetes    Mean Plasma Glucose 125.5 mg/dL    Comment: Performed at [facility], [place] [address], [place], [place] [address]  POC CBG, ED     Status: None   Collection Time: 11/25/21  4:56 AM  Result Value Ref Range   Glucose-Capillary 87 70 - 99 mg/dL    Comment: Glucose reference range applies only to samples taken after fasting for at least 8 hours.  MRSA Next Gen by PCR, Nasal     Status: None   Collection Time: 11/25/21  5:12 AM   Specimen: Nasal Mucosa; Nasal Swab  Result Value Ref Range   MRSA by PCR Next  Gen NOT DETECTED NOT DETECTED    Comment: (NOTE) The GeneXpert MRSA Assay (FDA approved for NASAL specimens only), is one component of a comprehensive MRSA colonization surveillance program. It is not intended to diagnose MRSA infection nor to guide or monitor treatment for MRSA infections. Test performance is not FDA approved in patients less than 58 years old. Performed at [facility], [address] [place], [place] [address]   Urinalysis, Routine w reflex microscopic Urine, Clean Catch     Status: Abnormal   Collection Time: 11/25/21  5:43 AM  Result Value Ref Range   Color, Urine YELLOW YELLOW   APPearance CLEAR CLEAR   Specific Gravity, Urine >1.030 (H) 1.005 - 1.030   pH 5.5 5.0 - 8.0   Glucose, UA NEGATIVE NEGATIVE mg/dL   Hgb urine dipstick NEGATIVE NEGATIVE   Bilirubin Urine NEGATIVE NEGATIVE   Ketones, ur NEGATIVE NEGATIVE mg/dL   Protein, ur 100 (A) NEGATIVE mg/dL   Nitrite NEGATIVE NEGATIVE   Leukocytes,Ua NEGATIVE NEGATIVE    Comment: Performed at [facility], [address], [place], [place] [address]  Urine Culture     Status:  None   Collection Time: 11/25/21  5:43 AM   Specimen: In/Out Cath Urine  Result Value Ref Range   Specimen Description      IN/OUT CATH URINE Performed at [facility], [address], [place], [place] [address]    Special Requests      NONE Performed at [facility], [address], [place], [place] [address]    Culture      NO GROWTH Performed at [facility], [place] [address] [place], [place] [address]    Report Status 11/26/2021 FINAL   Urinalysis, Microscopic (reflex)     Status: None   Collection Time: 11/25/21  5:43 AM  Result Value Ref Range   RBC / HPF 0-5 0 - 5 RBC/hpf   WBC, UA 0-5 0 - 5 WBC/hpf   Bacteria, UA NONE SEEN NONE SEEN   Squamous Epithelial / LPF NONE SEEN 0 - 5    Comment: Performed at [facility], [address], [place], [place] [address]  Glucose, capillary     Status: Abnormal   Collection Time:  11/25/21  6:17 AM  Result Value Ref Range   Glucose-Capillary 36 (LL) 70 - 99 mg/dL    Comment: Glucose reference range applies only to samples taken after fasting for at least 8 hours.   Comment 1 Document in Chart   Glucose, capillary     Status: Abnormal   Collection Time: 11/25/21  6:43 AM  Result Value Ref Range   Glucose-Capillary 68 (L) 70 - 99 mg/dL    Comment: Glucose reference range applies only to samples taken after fasting for at least 8 hours.  Glucose, capillary     Status: Abnormal   Collection Time: 11/25/21  7:11 AM  Result Value Ref Range   Glucose-Capillary 54 (L) 70 - 99 mg/dL    Comment: Glucose reference range applies only to samples taken after fasting for at least 8 hours.   Comment 1 Notify RN    Comment 2 Document in Chart   Glucose, capillary     Status: Abnormal   Collection Time: 11/25/21  8:21 AM  Result Value Ref Range   Glucose-Capillary 323 (H) 70 - 99 mg/dL    Comment: Glucose reference range applies only to samples taken after fasting for at least 8 hours.  Glucose, capillary     Status: None   Collection Time: 11/25/21  9:13 AM  Result Value Ref Range   Glucose-Capillary 78 70 - 99 mg/dL    Comment: Glucose reference range applies only to samples taken after fasting for at least 8 hours.  ECHOCARDIOGRAM COMPLETE     Status: None   Collection Time: 11/25/21  9:34 AM  Result Value Ref Range   Weight 3,728.42 oz   Height 69 in   BP 179/73 mmHg   Single Plane A2C EF 56.8 %   Single Plane A4C EF 58.0 %   Calc EF 57.5 %   AR max vel 2.37 cm2   AV Area VTI 2.58 cm2   AV Mean grad 7.0 mmHg   AV Peak grad 11.3 mmHg   Ao pk vel 1.68 m/s   AV Area mean vel 2.48 cm2   MV VTI 2.64 cm2   Area-P 1/2 3.79 cm2   S' Lateral 3.90 cm  Glucose, capillary     Status: Abnormal   Collection Time: 11/25/21 10:38 AM  Result Value Ref Range   Glucose-Capillary 69 (L) 70 - 99 mg/dL    Comment:  Glucose reference range applies only to samples taken after  fasting for at least 8 hours.  Glucose, capillary     Status: Abnormal   Collection Time: 11/25/21 11:21 AM  Result Value Ref Range   Glucose-Capillary 63 (L) 70 - 99 mg/dL    Comment: Glucose reference range applies only to samples taken after fasting for at least 8 hours.  Glucose, capillary     Status: Abnormal   Collection Time: 11/25/21 12:19 PM  Result Value Ref Range   Glucose-Capillary 59 (L) 70 - 99 mg/dL    Comment: Glucose reference range applies only to samples taken after fasting for at least 8 hours.  Glucose, capillary     Status: None   Collection Time: 11/25/21  1:17 PM  Result Value Ref Range   Glucose-Capillary 83 70 - 99 mg/dL    Comment: Glucose reference range applies only to samples taken after fasting for at least 8 hours.  Glucose, capillary     Status: None   Collection Time: 11/25/21  2:21 PM  Result Value Ref Range   Glucose-Capillary 88 70 - 99 mg/dL    Comment: Glucose reference range applies only to samples taken after fasting for at least 8 hours.  Glucose, capillary     Status: Abnormal   Collection Time: 11/25/21  3:46 PM  Result Value Ref Range   Glucose-Capillary 45 (L) 70 - 99 mg/dL    Comment: Glucose reference range applies only to samples taken after fasting for at least 8 hours.  Glucose, capillary     Status: Abnormal   Collection Time: 11/25/21  4:44 PM  Result Value Ref Range   Glucose-Capillary 107 (H) 70 - 99 mg/dL    Comment: Glucose reference range applies only to samples taken after fasting for at least 8 hours.  Glucose, capillary     Status: Abnormal   Collection Time: 11/25/21  6:07 PM  Result Value Ref Range   Glucose-Capillary 130 (H) 70 - 99 mg/dL    Comment: Glucose reference range applies only to samples taken after fasting for at least 8 hours.  Occult blood card to lab, stool     Status: None   Collection Time: 11/25/21  7:05 PM  Result Value Ref Range   Fecal Occult Bld NEGATIVE NEGATIVE    Comment: Performed at [facility], [address], [place], [place] [address]  Glucose, capillary     Status: None   Collection Time: 11/25/21  8:02 PM  Result Value Ref Range   Glucose-Capillary 93 70 - 99 mg/dL    Comment: Glucose reference range applies only to samples taken after fasting for at least 8 hours.  Glucose, capillary     Status: Abnormal   Collection Time: 11/25/21  9:36 PM  Result Value Ref Range   Glucose-Capillary 108 (H) 70 - 99 mg/dL    Comment: Glucose reference range applies only to samples taken after fasting for at least 8 hours.  Glucose, capillary     Status: None   Collection Time: 11/25/21 11:54 PM  Result Value Ref Range   Glucose-Capillary 80 70 - 99 mg/dL    Comment: Glucose reference range applies only to samples taken after fasting for at least 8 hours.  Glucose, capillary     Status: None   Collection Time: 11/26/21  2:00 AM  Result Value Ref Range   Glucose-Capillary 86 70 - 99 mg/dL    Comment: Glucose reference range applies only to samples taken  after fasting for at least 8 hours.  Glucose, capillary     Status: None   Collection Time: 11/26/21  4:04 AM  Result Value Ref Range   Glucose-Capillary 92 70 - 99 mg/dL    Comment: Glucose reference range applies only to samples taken after fasting for at least 8 hours.  Basic metabolic panel     Status: Abnormal   Collection Time: 11/26/21  4:18 AM  Result Value Ref Range   Sodium 136 135 - 145 mmol/L   Potassium 3.6 3.5 - 5.1 mmol/L   Chloride 108 98 - 111 mmol/L   CO2 24 22 - 32 mmol/L   Glucose, Bld 94 70 - 99 mg/dL    Comment: Glucose reference range applies only to samples taken after fasting for at least 8 hours.   BUN 35 (H) 8 - 23 mg/dL   Creatinine, Ser 4.13 (H) 0.61 - 1.24 mg/dL   Calcium 8.3 (L) 8.9 - 10.3 mg/dL   GFR, Estimated 14 (L) >60 mL/min    Comment: (NOTE) Calculated using the CKD-EPI Creatinine Equation (2021)    Anion gap 4 (L) 5 - 15    Comment: Performed at [facility], [address],  [place], [place] [address]  CBC     Status: Abnormal   Collection Time: 11/26/21  4:18 AM  Result Value Ref Range   WBC 11.3 (H) 4.0 - 10.5 K/uL   RBC 3.35 (L) 4.22 - 5.81 MIL/uL   Hemoglobin 10.2 (L) 13.0 - 17.0 g/dL   HCT 30.8 (L) 39.0 - 52.0 %   MCV 91.9 80.0 - 100.0 fL   MCH 30.4 26.0 - 34.0 pg   MCHC 33.1 30.0 - 36.0 g/dL   RDW 15.7 (H) 11.5 - 15.5 %   Platelets 317 150 - 400 K/uL   nRBC 0.0 0.0 - 0.2 %    Comment: Performed at [facility], [address] [place], [place] [address]  Glucose, capillary     Status: Abnormal   Collection Time: 11/26/21  5:50 AM  Result Value Ref Range   Glucose-Capillary 104 (H) 70 - 99 mg/dL    Comment: Glucose reference range applies only to samples taken after fasting for at least 8 hours.  Glucose, capillary     Status: Abnormal   Collection Time: 11/26/21  7:39 AM  Result Value Ref Range   Glucose-Capillary 111 (H) 70 - 99 mg/dL    Comment: Glucose reference range applies only to samples taken after fasting for at least 8 hours.  Glucose, capillary     Status: Abnormal   Collection Time: 11/26/21 11:39 AM  Result Value Ref Range   Glucose-Capillary 154 (H) 70 - 99 mg/dL    Comment: Glucose reference range applies only to samples taken after fasting for at least 8 hours.  Glucose, capillary     Status: Abnormal   Collection Time: 11/26/21  4:12 PM  Result Value Ref Range   Glucose-Capillary 168 (H) 70 - 99 mg/dL    Comment: Glucose reference range applies only to samples taken after fasting for at least 8 hours.  Glucose, capillary     Status: Abnormal   Collection Time: 11/26/21  8:27 PM  Result Value Ref Range   Glucose-Capillary 176 (H) 70 - 99 mg/dL    Comment: Glucose reference range applies only to samples taken after fasting for at least 8 hours.  Glucose, capillary     Status: Abnormal   Collection Time: 11/27/21 12:07 AM  Result Value Ref Range   Glucose-Capillary 165 (H) 70 - 99 mg/dL    Comment: Glucose reference range applies  only to samples taken after fasting for at least 8 hours.  Glucose, capillary     Status: Abnormal   Collection Time: 11/27/21  4:06 AM  Result Value Ref Range   Glucose-Capillary 161 (H) 70 - 99 mg/dL    Comment: Glucose reference range applies only to samples taken after fasting for at least 8 hours.  Basic metabolic panel     Status: Abnormal   Collection Time: 11/27/21  4:33 AM  Result Value Ref Range   Sodium 135 135 - 145 mmol/L   Potassium 4.0 3.5 - 5.1 mmol/L   Chloride 107 98 - 111 mmol/L   CO2 21 (L) 22 - 32 mmol/L   Glucose, Bld 168 (H) 70 - 99 mg/dL    Comment: Glucose reference range applies only to samples taken after fasting for at least 8 hours.   BUN 41 (H) 8 - 23 mg/dL   Creatinine, Ser 4.45 (H) 0.61 - 1.24 mg/dL   Calcium 8.2 (L) 8.9 - 10.3 mg/dL   GFR, Estimated 13 (L) >60 mL/min    Comment: (NOTE) Calculated using the CKD-EPI Creatinine Equation (2021)    Anion gap 7 5 - 15    Comment: Performed at [facility], [address], [place], [place] [address]  Magnesium     Status: None   Collection Time: 11/27/21  4:33 AM  Result Value Ref Range   Magnesium 2.1 1.7 - 2.4 mg/dL    Comment: Performed at [facility], [address], [place], [place] [address]  CBC     Status: Abnormal   Collection Time: 11/27/21  4:33 AM  Result Value Ref Range   WBC 8.8 4.0 - 10.5 K/uL   RBC 3.06 (L) 4.22 - 5.81 MIL/uL   Hemoglobin 9.3 (L) 13.0 - 17.0 g/dL   HCT 28.4 (L) 39.0 - 52.0 %   MCV 92.8 80.0 - 100.0 fL   MCH 30.4 26.0 - 34.0 pg   MCHC 32.7 30.0 - 36.0 g/dL   RDW 15.5 11.5 - 15.5 %   Platelets 258 150 - 400 K/uL   nRBC 0.0 0.0 - 0.2 %    Comment: Performed at [facility], [address] [place], [place] [address]  Glucose, capillary     Status: Abnormal   Collection Time: 11/27/21  7:28 AM  Result Value Ref Range   Glucose-Capillary 145 (H) 70 - 99 mg/dL    Comment: Glucose reference range applies only to samples taken after fasting for at least 8 hours.   Glucose, capillary     Status: Abnormal   Collection Time: 11/27/21 11:30 AM  Result Value Ref Range   Glucose-Capillary 192 (H) 70 - 99 mg/dL    Comment: Glucose reference range applies only to samples taken after fasting for at least 8 hours.  Glucose, capillary     Status: Abnormal   Collection Time: 11/27/21  4:33 PM  Result Value Ref Range   Glucose-Capillary 183 (H) 70 - 99 mg/dL    Comment: Glucose reference range applies only to samples taken after fasting for at least 8 hours.  Glucose, capillary     Status: Abnormal   Collection Time: 11/27/21  7:53 PM  Result Value Ref Range   Glucose-Capillary 174 (H) 70 - 99 mg/dL    Comment: Glucose reference range applies only to samples taken after fasting for at  least 8 hours.  Glucose, capillary     Status: Abnormal   Collection Time: 11/27/21  8:27 PM  Result Value Ref Range   Glucose-Capillary 183 (H) 70 - 99 mg/dL    Comment: Glucose reference range applies only to samples taken after fasting for at least 8 hours.  Glucose, capillary     Status: Abnormal   Collection Time: 11/28/21 12:00 AM  Result Value Ref Range   Glucose-Capillary 161 (H) 70 - 99 mg/dL    Comment: Glucose reference range applies only to samples taken after fasting for at least 8 hours.  Vitamin B12     Status: Abnormal   Collection Time: 11/28/21  4:48 AM  Result Value Ref Range   Vitamin B-12 177 (L) 180 - 914 pg/mL    Comment: (NOTE) This assay is not validated for testing neonatal or myeloproliferative syndrome specimens for Vitamin B12 levels. Performed at [facility], [address] [place], [place] [address]   Folate     Status: None   Collection Time: 11/28/21  4:48 AM  Result Value Ref Range   Folate 6.2 >5.9 ng/mL    Comment: Performed at [facility], [address] [place], [place] [address]  Iron and TIBC     Status: Abnormal   Collection Time: 11/28/21  4:48 AM  Result Value Ref Range   Iron 23 (L) 45 - 182 ug/dL   TIBC 207 (L) 250 -  450 ug/dL   Saturation Ratios 11 (L) 17.9 - 39.5 %   UIBC 184 ug/dL    Comment: Performed at [facility], [address] [place], [place] [address]  Ferritin     Status: Abnormal   Collection Time: 11/28/21  4:48 AM  Result Value Ref Range   Ferritin 342 (H) 24 - 336 ng/mL    Comment: Performed at [facility], [address], [place], [place] [address]  Reticulocytes     Status: Abnormal   Collection Time: 11/28/21  4:50 AM  Result Value Ref Range   Retic Ct Pct 1.4 0.4 - 3.1 %   RBC. 3.08 (L) 4.22 - 5.81 MIL/uL   Retic Count, Absolute 42.8 19.0 - 186.0 K/uL   Immature Retic Fract 12.0 2.3 - 15.9 %    Comment: Performed at [facility], [address], [place], [place] [address]  Basic metabolic panel     Status: Abnormal   Collection Time: 11/28/21  4:51 AM  Result Value Ref Range   Sodium 137 135 - 145 mmol/L   Potassium 4.2 3.5 - 5.1 mmol/L   Chloride 107 98 - 111 mmol/L   CO2 23 22 - 32 mmol/L   Glucose, Bld 147 (H) 70 - 99 mg/dL    Comment: Glucose reference range applies only to samples taken after fasting for at least 8 hours.   BUN 44 (H) 8 - 23 mg/dL   Creatinine, Ser 4.55 (H) 0.61 - 1.24 mg/dL   Calcium 8.2 (L) 8.9 - 10.3 mg/dL   GFR, Estimated 13 (L) >60 mL/min    Comment: (NOTE) Calculated using the CKD-EPI Creatinine Equation (2021)    Anion gap 7 5 - 15    Comment: Performed at [facility], [address], [place], [place] [address]  Magnesium     Status: None   Collection Time: 11/28/21  4:51 AM  Result Value Ref Range   Magnesium 2.1 1.7 - 2.4 mg/dL    Comment: Performed at [facility], [address] [place], [place]  [address]  CBC     Status: Abnormal   Collection Time: 11/28/21  4:51 AM  Result Value Ref Range   WBC 9.5 4.0 - 10.5 K/uL   RBC 3.06 (L) 4.22 - 5.81 MIL/uL   Hemoglobin 9.1 (L) 13.0 - 17.0 g/dL   HCT 28.3 (L) 39.0 - 52.0 %   MCV 92.5 80.0 - 100.0 fL   MCH 29.7 26.0 - 34.0 pg   MCHC 32.2 30.0 - 36.0 g/dL   RDW 15.1 11.5 - 15.5 %    Platelets 283 150 - 400 K/uL   nRBC 0.0 0.0 - 0.2 %    Comment: Performed at [facility], [address] [place], [place] [address]  Glucose, capillary     Status: Abnormal   Collection Time: 11/28/21  5:07 AM  Result Value Ref Range   Glucose-Capillary 149 (H) 70 - 99 mg/dL    Comment: Glucose reference range applies only to samples taken after fasting for at least 8 hours.  Glucose, capillary     Status: Abnormal   Collection Time: 11/28/21  7:08 AM  Result Value Ref Range   Glucose-Capillary 149 (H) 70 - 99 mg/dL    Comment: Glucose reference range applies only to samples taken after fasting for at least 8 hours.  Glucose, capillary     Status: Abnormal   Collection Time: 11/28/21 11:11 AM  Result Value Ref Range   Glucose-Capillary 258 (H) 70 - 99 mg/dL    Comment: Glucose reference range applies only to samples taken after fasting for at least 8 hours.  Hemoglobin and hematocrit, blood     Status: Abnormal   Collection Time: 12/16/21  7:01 AM  Result Value Ref Range   Hemoglobin 11.0 (L) 13.0 - 17.0 g/dL   HCT 34.4 (L) 39.0 - 52.0 %    Comment: Performed at [facility], [address] [place], [place] [address]  Basic metabolic panel     Status: Abnormal   Collection Time: 12/16/21  7:01 AM  Result Value Ref Range   Sodium 140 135 - 145 mmol/L   Potassium 3.9 3.5 - 5.1 mmol/L   Chloride 108 98 - 111 mmol/L   CO2 20 (L) 22 - 32 mmol/L   Glucose, Bld 139 (H) 70 - 99 mg/dL    Comment: Glucose reference range applies only to samples taken after fasting for at least 8 hours.   BUN 35 (H) 8 - 23 mg/dL   Creatinine, Ser 3.86 (H) 0.61 - 1.24 mg/dL   Calcium 8.8 (L) 8.9 - 10.3 mg/dL   GFR, Estimated 15 (L) >60 mL/min    Comment: (NOTE) Calculated using the CKD-EPI Creatinine Equation (2021)    Anion gap 12 5 - 15    Comment: Performed at [facility], [address] [place], [place] [address]  Glucose, capillary     Status: Abnormal   Collection Time: 12/16/21  9:08 AM   Result Value Ref Range   Glucose-Capillary 126 (H) 70 - 99 mg/dL    Comment: Glucose reference range applies only to samples taken after fasting for at least 8 hours.  Folate     Status: None   Collection Time: 02/20/22 11:44 AM  Result Value Ref Range   Folate 7.3 >5.9 ng/mL    Comment: Performed at [facility], [address] [place], [place] [address]  Vitamin B12     Status: None   Collection Time: 02/20/22 11:44 AM  Result Value Ref Range   Vitamin  B-12 206 180 - 914 pg/mL    Comment: (NOTE) This assay is not validated for testing neonatal or myeloproliferative syndrome specimens for Vitamin B12 levels. Performed at [facility], [address] [place], [place] [address]   Ferritin     Status: None   Collection Time: 02/20/22 11:44 AM  Result Value Ref Range   Ferritin 299 24 - 336 ng/mL    Comment: Performed at [facility], [address] [place], [place] [address]  Iron and TIBC [facility]/AP/[facility]/[place] ONLY)     Status: Abnormal   Collection Time: 02/20/22 11:44 AM  Result Value Ref Range   Iron 50 45 - 182 ug/dL   TIBC 297 250 - 450 ug/dL   Saturation Ratios 17 (L) 17.9 - 39.5 %   UIBC 247 ug/dL    Comment: Performed at [facility], [address] [place], [place] [address]  Lactate dehydrogenase     Status: None   Collection Time: 02/20/22 11:44 AM  Result Value Ref Range   LDH 159 98 - 192 U/L    Comment: Performed at [facility], [address] [place], [place] [address]  Reticulocytes     Status: Abnormal   Collection Time: 02/20/22 11:44 AM  Result Value Ref Range   Retic Ct Pct 1.2 0.4 - 3.1 %   RBC. 3.61 (L) 4.22 - 5.81 MIL/uL   Retic Count, Absolute 43.0 19.0 - 186.0 K/uL   Immature Retic Fract 6.7 2.3 - 15.9 %    Comment: Performed at [facility], [address], [place], [place] [address]  CBC with Differential     Status: Abnormal   Collection Time: 02/20/22 11:44 AM  Result Value Ref Range   WBC 8.5 4.0 - 10.5 K/uL   RBC 3.54 (L) 4.22 -  5.81 MIL/uL   Hemoglobin 10.3 (L) 13.0 - 17.0 g/dL   HCT 32.8 (L) 39.0 - 52.0 %   MCV 92.7 80.0 - 100.0 fL   MCH 29.1 26.0 - 34.0 pg   MCHC 31.4 30.0 - 36.0 g/dL   RDW 16.0 (H) 11.5 - 15.5 %   Platelets 322 150 - 400 K/uL   nRBC 0.0 0.0 - 0.2 %   Neutrophils Relative % 65 %   Neutro Abs 5.6 1.7 - 7.7 K/uL   Lymphocytes Relative 23 %   Lymphs Abs 2.0 0.7 - 4.0 K/uL   Monocytes Relative 6 %   Monocytes Absolute 0.5 0.1 - 1.0 K/uL   Eosinophils Relative 5 %   Eosinophils Absolute 0.4 0.0 - 0.5 K/uL   Basophils Relative 1 %   Basophils Absolute 0.1 0.0 - 0.1 K/uL   Immature Granulocytes 0 %   Abs Immature Granulocytes 0.02 0.00 - 0.07 K/uL    Comment: Performed at [facility], [address] [place], [place] [address]    RADIOGRAPHIC STUDIES: I have personally reviewed the radiological images as listed and agreed with the findings in the report. No results found.  ASSESSMENT:  1.  Normocytic anemia: - Patient seen at the request of Dr. Theador Hawthorne - CBC (12/15/2021): Hb-10.5, MCV-88, PLT-393, WBC-10.9, creatinine 3.87 - Denies any bleeding per rectum or melena.  Denies any ice pica. - Takes B12 1 mg tablet daily. - He has received parenteral iron therapy in June 2023 in [facility] for a total dose of 1 g. - Denies any previous transfusion history.  2.  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  Quit smoking cigarettes 30 years ago. - Father had prostate cancer.  PLAN:  1.  Normocytic anemia: - Combination anemia from CKD and relative iron deficiency. - Will repeat CBC today and check for nutritional deficiencies and hemolysis. - Will also check for bone marrow infiltrative process. - Will check stool for occult blood. - RTC 1 to 2 weeks for follow-up.  Will discuss the need for any parenteral iron therapy or ESA's at that time.   All questions were answered. The patient knows to call the clinic with any problems,  questions or concerns.      Derek Jack, MD 02/20/22 4:57 PM

## 2022-02-20 NOTE — Patient Instructions (Signed)
[facility] [facility] Discharge Instructions   You were seen and examined today by Dr. Delton Coombes. He is a blood specialist who is seeing you today for anemia (low hemoglobin).   We will obtain additional blood work from you today.   Return as scheduled.    Thank you for choosing [place] at [facility] to provide your oncology and hematology care.  To afford each patient quality time with our provider, please arrive at least 15 minutes before your scheduled appointment time.   If you have a lab appointment with the [facility] please come in thru the Main Entrance and check in at the main information desk.  You need to re-schedule your appointment should you arrive 10 or more minutes late.  We strive to give you quality time with our providers, and arriving late affects you and other patients whose appointments are after yours.  Also, if you no show three or more times for appointments you may be dismissed from the clinic at the providers discretion.     Again, thank you for choosing [facility].  Our hope is that these requests will decrease the amount of time that you wait before being seen by our physicians.       _____________________________________________________________  Should you have questions after your visit to [facility], please contact our office at [phone] and follow the prompts.  Our office hours are 8:00 a.m. and 4:30 p.m. Monday - Friday.  Please note that voicemails left after 4:00 p.m. may not be returned until the following business day.  We are closed weekends and major holidays.  You do have access to a nurse 24-7, just call the main number to the clinic [phone] and do not press any options, hold on the line and a nurse will answer the phone.    For prescription refill requests, have your pharmacy contact our office and allow 72 hours.    Due to Covid, you will need to wear a  mask upon entering the hospital. If you do not have a mask, a mask will be given to you at the Main Entrance upon arrival. For doctor visits, patients may have 1 support person age 78 or older with them. For treatment visits, patients can not have anyone with them due to social distancing guidelines and our immunocompromised population.

## 2022-02-23 LAB — KAPPA/LAMBDA LIGHT CHAINS
Kappa free light chain: 143.2 mg/L — ABNORMAL HIGH (ref 3.3–19.4)
Kappa, lambda light chain ratio: 2.68 — ABNORMAL HIGH (ref 0.26–1.65)
Lambda free light chains: 53.4 mg/L — ABNORMAL HIGH (ref 5.7–26.3)

## 2022-02-24 LAB — PROTEIN ELECTROPHORESIS, SERUM
A/G Ratio: 0.8 (ref 0.7–1.7)
Albumin ELP: 3.5 g/dL (ref 2.9–4.4)
Alpha-1-Globulin: 0.3 g/dL (ref 0.0–0.4)
Alpha-2-Globulin: 1.1 g/dL — ABNORMAL HIGH (ref 0.4–1.0)
Beta Globulin: 1.1 g/dL (ref 0.7–1.3)
Gamma Globulin: 1.7 g/dL (ref 0.4–1.8)
Globulin, Total: 4.2 g/dL — ABNORMAL HIGH (ref 2.2–3.9)
Total Protein ELP: 7.7 g/dL (ref 6.0–8.5)

## 2022-02-26 ENCOUNTER — Other Ambulatory Visit (HOSPITAL_COMMUNITY)
Admission: RE | Admit: 2022-02-26 | Discharge: 2022-02-26 | Disposition: A | Discharge status: Home / Self Care | Payer: Medicare Other | Source: Ambulatory Visit | Attending: Hematology | Admitting: Hematology

## 2022-02-26 DIAGNOSIS — D649 Anemia, unspecified: Secondary | ICD-10-CM | POA: Diagnosis not present

## 2022-02-26 LAB — OCCULT BLOOD X 1 CARD TO LAB, STOOL
Fecal Occult Bld: POSITIVE — AB
Fecal Occult Bld: POSITIVE — AB
Fecal Occult Bld: POSITIVE — AB

## 2022-02-26 NOTE — Progress Notes (Signed)
Dr. Delton Coombes made aware of results.  No further orders at this time.

## 2022-02-27 LAB — METHYLMALONIC ACID, SERUM: Methylmalonic Acid, Quantitative: 562 nmol/L — ABNORMAL HIGH (ref 0–378)

## 2022-03-01 LAB — COPPER, SERUM: Copper: 115 ug/dL (ref 69–132)

## 2022-03-05 ENCOUNTER — Other Ambulatory Visit (HOSPITAL_COMMUNITY)
Admission: RE | Admit: 2022-03-05 | Discharge: 2022-03-05 | Disposition: A | Discharge status: Home / Self Care | Payer: Medicare Other | Source: Ambulatory Visit | Attending: Nephrology | Admitting: Nephrology

## 2022-03-05 DIAGNOSIS — N189 Chronic kidney disease, unspecified: Secondary | ICD-10-CM | POA: Diagnosis present

## 2022-03-05 DIAGNOSIS — E1122 Type 2 diabetes mellitus with diabetic chronic kidney disease: Secondary | ICD-10-CM | POA: Diagnosis not present

## 2022-03-05 DIAGNOSIS — I129 Hypertensive chronic kidney disease with stage 1 through stage 4 chronic kidney disease, or unspecified chronic kidney disease: Secondary | ICD-10-CM | POA: Diagnosis not present

## 2022-03-05 DIAGNOSIS — E211 Secondary hyperparathyroidism, not elsewhere classified: Secondary | ICD-10-CM | POA: Diagnosis not present

## 2022-03-05 DIAGNOSIS — I5032 Chronic diastolic (congestive) heart failure: Secondary | ICD-10-CM | POA: Diagnosis not present

## 2022-03-05 DIAGNOSIS — E1129 Type 2 diabetes mellitus with other diabetic kidney complication: Secondary | ICD-10-CM | POA: Diagnosis not present

## 2022-03-05 DIAGNOSIS — R809 Proteinuria, unspecified: Secondary | ICD-10-CM | POA: Diagnosis not present

## 2022-03-05 DIAGNOSIS — D631 Anemia in chronic kidney disease: Secondary | ICD-10-CM | POA: Diagnosis not present

## 2022-03-05 LAB — RENAL FUNCTION PANEL
Albumin: 3.6 g/dL (ref 3.5–5.0)
Anion gap: 10 (ref 5–15)
BUN: 32 mg/dL — ABNORMAL HIGH (ref 8–23)
CO2: 22 mmol/L (ref 22–32)
Calcium: 8.6 mg/dL — ABNORMAL LOW (ref 8.9–10.3)
Chloride: 106 mmol/L (ref 98–111)
Creatinine, Ser: 4.02 mg/dL — ABNORMAL HIGH (ref 0.61–1.24)
GFR, Estimated: 15 mL/min — ABNORMAL LOW (ref >60)
Glucose, Bld: 112 mg/dL — ABNORMAL HIGH (ref 70–99)
Phosphorus: 3.5 mg/dL (ref 2.5–4.6)
Potassium: 4.3 mmol/L (ref 3.5–5.1)
Sodium: 138 mmol/L (ref 135–145)

## 2022-03-17 ENCOUNTER — Inpatient Hospital Stay: Payer: Medicare Other

## 2022-03-17 ENCOUNTER — Inpatient Hospital Stay: Payer: Medicare Other | Admitting: Hematology

## 2022-03-17 ENCOUNTER — Ambulatory Visit: Payer: Medicare Other | Admitting: Hematology

## 2022-03-17 VITALS — BP 158/65 | HR 75 | Temp 98.2°F | Resp 18 | Ht 69.0 in | Wt 216.9 lb

## 2022-03-17 DIAGNOSIS — D649 Anemia, unspecified: Secondary | ICD-10-CM | POA: Diagnosis not present

## 2022-03-17 DIAGNOSIS — Z79899 Other long term (current) drug therapy: Secondary | ICD-10-CM | POA: Diagnosis not present

## 2022-03-17 DIAGNOSIS — Z87891 Personal history of nicotine dependence: Secondary | ICD-10-CM | POA: Diagnosis not present

## 2022-03-17 DIAGNOSIS — Z7982 Long term (current) use of aspirin: Secondary | ICD-10-CM | POA: Diagnosis not present

## 2022-03-17 DIAGNOSIS — N184 Chronic kidney disease, stage 4 (severe): Secondary | ICD-10-CM | POA: Diagnosis not present

## 2022-03-17 DIAGNOSIS — D631 Anemia in chronic kidney disease: Secondary | ICD-10-CM | POA: Diagnosis not present

## 2022-03-17 DIAGNOSIS — D509 Iron deficiency anemia, unspecified: Secondary | ICD-10-CM | POA: Insufficient documentation

## 2022-03-17 DIAGNOSIS — E538 Deficiency of other specified B group vitamins: Secondary | ICD-10-CM | POA: Diagnosis not present

## 2022-03-17 MED ORDER — CYANOCOBALAMIN 1000 MCG/ML IJ SOLN
1000.0000 ug | Freq: Once | INTRAMUSCULAR | Status: AC
  Administered 2022-03-17: 1000 ug via INTRAMUSCULAR
  Filled 2022-03-17: qty 1

## 2022-03-17 NOTE — Patient Instructions (Signed)
[facility]  Discharge Instructions: Thank you for choosing [facility] to provide your oncology and hematology care.  If you have a lab appointment with the Cancer Center, please come in thru the Main Entrance and check in at the main information desk.  Wear comfortable clothing and clothing appropriate for easy access to any Portacath or PICC line.   We strive to give you quality time with your provider. You may need to reschedule your appointment if you arrive late (15 or more minutes).  Arriving late affects you and other patients whose appointments are after yours.  Also, if you miss three or more appointments without notifying the office, you may be dismissed from the clinic at the provider's discretion.      For prescription refill requests, have your pharmacy contact our office and allow 72 hours for refills to be completed.    Today you received a B12 injection    To help prevent nausea and vomiting after your treatment, we encourage you to take your nausea medication as directed.  BELOW ARE SYMPTOMS THAT SHOULD BE REPORTED IMMEDIATELY: *FEVER GREATER THAN 100.4 F (38 C) OR HIGHER *CHILLS OR SWEATING *NAUSEA AND VOMITING THAT IS NOT CONTROLLED WITH YOUR NAUSEA MEDICATION *UNUSUAL SHORTNESS OF BREATH *UNUSUAL BRUISING OR BLEEDING *URINARY PROBLEMS (pain or burning when urinating, or frequent urination) *BOWEL PROBLEMS (unusual diarrhea, constipation, pain near the anus) TENDERNESS IN MOUTH AND THROAT WITH OR WITHOUT PRESENCE OF ULCERS (sore throat, sores in mouth, or a toothache) UNUSUAL RASH, SWELLING OR PAIN  UNUSUAL VAGINAL DISCHARGE OR ITCHING   Items with * indicate a potential emergency and should be followed up as soon as possible or go to the Emergency Department if any problems should occur.  Please show the CHEMOTHERAPY ALERT CARD or IMMUNOTHERAPY ALERT CARD at check-in to the Emergency Department and triage nurse.  Should you have  questions after your visit or need to cancel or reschedule your appointment, please contact [facility] [phone]  and follow the prompts.  Office hours are 8:00 a.m. to 4:30 p.m. Monday - Friday. Please note that voicemails left after 4:00 p.m. may not be returned until the following business day.  We are closed weekends and major holidays. You have access to a nurse at all times for urgent questions. Please call the main number to the clinic [phone] and follow the prompts.  For any non-urgent questions, you may also contact your provider using MyChart. We now offer e-Visits for anyone 18 and older to request care online for non-urgent symptoms. For details visit [email or web address].   Also download the MyChart app! Go to the app store, search "MyChart", open the app, select [place], and log in with your MyChart username and password.  Masks are optional in the cancer centers. If you would like for your care team to wear a mask while they are taking care of you, please let them know. You may have one support person who is at least 78 years old accompany you for your appointments.  

## 2022-03-17 NOTE — Patient Instructions (Signed)
[facility]  Discharge Instructions  You were seen and examined today by Dr. Delton Coombes.  Dr. Delton Coombes discussed your most recent lab work and stool cards which revealed that you do have microscopic blood in your stool. Your vitamin b12 is low and your iron is low.  Dr. Delton Coombes recommends that you start taking Vitamin B12 1 mg tablet daily and you will have a Vitamin B12 injection today. He is also ordering IV iron one dose to be scheduled. Dr. Delton Coombes sent a referral to gastroenterology for you to have a colonoscopy to see where the microscopic blood may be coming from.  Follow-up as scheduled.    Thank you for choosing [facility] to provide your oncology and hematology care.   To afford each patient quality time with our provider, please arrive at least 15 minutes before your scheduled appointment time. You may need to reschedule your appointment if you arrive late (10 or more minutes). Arriving late affects you and other patients whose appointments are after yours.  Also, if you miss three or more appointments without notifying the office, you may be dismissed from the clinic at the provider's discretion.    Again, thank you for choosing [facility].  Our hope is that these requests will decrease the amount of time that you wait before being seen by our physicians.   If you have a lab appointment with the [facility] please come in thru the Main Entrance and check in at the main information desk.           _____________________________________________________________  Should you have questions after your visit to [facility], please contact our office at [phone] and follow the prompts.  Our office hours are 8:00 a.m. to 4:30 p.m. Monday - Thursday and 8:00 a.m. to 2:30 p.m. Friday.  Please note that voicemails left after 4:00 p.m. may not be returned until the following business day.  We are  closed weekends and all major holidays.  You do have access to a nurse 24-7, just call the main number to the clinic [phone] and do not press any options, hold on the line and a nurse will answer the phone.    For prescription refill requests, have your pharmacy contact our office and allow 72 hours.    Masks are optional in the cancer centers. If you would like for your care team to wear a mask while they are taking care of you, please let them know. You may have one support person who is at least 78 years old accompany you for your appointments.

## 2022-03-17 NOTE — Progress Notes (Signed)
[address], [place] [address]   CLINIC:  Medical Oncology/Hematology  PCP:  Caryl Bis, MD [place] [address] [phone]   REASON FOR VISIT:  Follow-up for normocytic anemia from chronic inflammation and B12 deficiency  PRIOR THERAPY: Parenteral iron therapy  CURRENT THERAPY: Parenteral iron and B12  INTERVAL HISTORY:  Devin [name] 76 y.o. male seen for follow-up of normocytic anemia.  This patient was evaluated by me on 02/20/2022 for further workup and management of normocytic anemia.  Energy levels are reported as 60%.  Denies any bleeding per rectum or melena.    REVIEW OF SYSTEMS:  Review of Systems  Musculoskeletal:  Positive for back pain.  All other systems reviewed and are negative.    PAST MEDICAL/SURGICAL HISTORY:  Past Medical History:  Diagnosis Date   Arthritis    CKD (chronic kidney disease) stage 4, GFR 15-29 ml/min (HCC)    Diabetes mellitus without complication ([place])    Gout    Hypertension    Past Surgical History:  Procedure Laterality Date   AV FISTULA PLACEMENT Left 12/16/2021   Procedure: LEFT ARM ARTERIOVENOUS (AV) FISTULA CREATION;  Surgeon: Rosetta Posner, MD;  Location: [facility];  Service: Vascular;  Laterality: Left;   KNEE SURGERY     TONSILLECTOMY       SOCIAL HISTORY:  Social History   Socioeconomic History   Marital status: Married    Spouse name: Not on file   Number of children: Not on file   Years of education: Not on file   Highest education level: Not on file  Occupational History   Not on file  Tobacco Use   Smoking status: Former    Years: 20.00    Types: Cigarettes    Quit date: 72    Years since quitting: 31.0    Passive exposure: Past   Smokeless tobacco: Never  Vaping Use   Vaping Use: Never used  Substance and Sexual Activity   Alcohol use: Never   Drug use: Never   Sexual activity: Yes  Other Topics Concern   Not on file  Social History Narrative    Not on file   Social Determinants of Health   Financial Resource Strain: Not on file  Food Insecurity: Not on file  Transportation Needs: Not on file  Physical Activity: Not on file  Stress: Not on file  Social Connections: Not on file  Intimate Partner Violence: Not on file    FAMILY HISTORY:  Family History  Family history unknown: Yes    CURRENT MEDICATIONS:  Outpatient Encounter Medications as of 03/17/2022  Medication Sig Note   allopurinol (ZYLOPRIM) 300 MG tablet Take 300 mg by mouth daily.    amLODipine (NORVASC) 10 MG tablet Take 10 mg by mouth daily.    aspirin EC 81 MG tablet Take 81 mg by mouth daily. Swallow whole.    carvedilol (COREG) 12.5 MG tablet Take 12.5 mg by mouth 2 (two) times daily with a meal.    cloNIDine (CATAPRES) 0.3 MG tablet Take 0.3 mg by mouth in the morning, at noon, and at bedtime.    Cyanocobalamin 1000 MCG SUBL Place under the tongue.    doxercalciferol (HECTOROL) 0.5 MCG capsule Take 0.5 mcg by mouth 3 (three) times a week.    epoetin alfa (EPOGEN) 3000 UNIT/ML injection Inject into the skin.    furosemide (LASIX) 40 MG tablet Take 40 mg by mouth daily.  glipiZIDE (GLUCOTROL XL) 10 MG 24 hr tablet Take 10 mg by mouth daily.    hydrALAZINE (APRESOLINE) 100 MG tablet Take 100 mg by mouth in the morning, at noon, and at bedtime.    oxyCODONE-acetaminophen (PERCOCET) 5-325 MG tablet Take 1 tablet by mouth every 6 (six) hours as needed for severe pain.    simvastatin (ZOCOR) 20 MG tablet Take 20 mg by mouth daily. 11/26/2021: No pharmacy records but it was in pt's med bag   sodium bicarbonate 650 MG tablet Take by mouth.    Vitamin D, Cholecalciferol, 25 MCG (1000 UT) CAPS Take by mouth.    linagliptin (TRADJENTA) 5 MG TABS tablet Take 1 tablet (5 mg total) by mouth daily.    No facility-administered encounter medications on file as of 03/17/2022.    ALLERGIES:  No Known Allergies   PHYSICAL EXAM:  ECOG Performance status: 1  Vitals:    03/17/22 1500  BP: (!) 158/65  Pulse: 75  Resp: 18  Temp: 98.2 F (36.8 C)  SpO2: 98%   Filed Weights   03/17/22 1500  Weight: 216 lb 14.4 oz (98.4 kg)   Physical Exam Vitals reviewed.  Constitutional:      Appearance: Normal appearance.  Cardiovascular:     Rate and Rhythm: Normal rate and regular rhythm.     Heart sounds: Normal heart sounds.  Pulmonary:     Effort: Pulmonary effort is normal.     Breath sounds: Normal breath sounds.  Abdominal:     Palpations: Abdomen is soft.  Neurological:     Mental Status: He is alert.  Psychiatric:        Mood and Affect: Mood normal.        Behavior: Behavior normal.      LABORATORY DATA:  I have reviewed the labs as listed.  CBC    Component Value Date/Time   WBC 8.5 02/20/2022 1144   RBC 3.61 (L) 02/20/2022 1144   RBC 3.54 (L) 02/20/2022 1144   HGB 10.3 (L) 02/20/2022 1144   HCT 32.8 (L) 02/20/2022 1144   PLT 322 02/20/2022 1144   MCV 92.7 02/20/2022 1144   MCH 29.1 02/20/2022 1144   MCHC 31.4 02/20/2022 1144   RDW 16.0 (H) 02/20/2022 1144   LYMPHSABS 2.0 02/20/2022 1144   MONOABS 0.5 02/20/2022 1144   EOSABS 0.4 02/20/2022 1144   BASOSABS 0.1 02/20/2022 1144      Latest Ref Rng & Units 03/05/2022   10:58 AM 12/16/2021    7:01 AM 11/28/2021    4:51 AM  CMP  Glucose 70 - 99 mg/dL 112  139  147   BUN 8 - 23 mg/dL 32  35  44   Creatinine 0.61 - 1.24 mg/dL 4.02  3.86  4.55   Sodium 135 - 145 mmol/L 138  140  137   Potassium 3.5 - 5.1 mmol/L 4.3  3.9  4.2   Chloride 98 - 111 mmol/L 106  108  107   CO2 22 - 32 mmol/L 22  20  23    Calcium 8.9 - 10.3 mg/dL 8.6  8.8  8.2     DIAGNOSTIC IMAGING:  I have independently reviewed the scans and discussed with the patient.  ASSESSMENT:  1.  Normocytic anemia: - Patient seen at the request of Dr. Theador Hawthorne - CBC (12/15/2021): Hb-10.5, MCV-88, PLT-393, WBC-10.9, creatinine 3.87 - Denies any bleeding per rectum or melena.  Denies any ice pica. - Takes B12 1 mg tablet  daily. -  He has received parenteral iron therapy in June 2023 in [facility] for a total dose of 1 g. - Denies any previous transfusion history.   2.  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  Quit smoking cigarettes 30 years ago. - Father had prostate cancer.  PLAN:  1.  Normocytic anemia: - Combination anemia from CKD/relative iron deficiency and B12 deficiency. - Stool cards were positive for occult blood indicating GI blood loss also.  Will make referral to GI for further workup.  His last colonoscopy was reportedly done in [facility] several years ago. - Hemoglobin was 10.3.  Ferritin was 299 and percent saturation of 17.  M spike was negative.  Free light chain ratio was 2.68, kappa light chains 143 likely from CKD. - Recommend Venofer 400 mg IV x 1 dose or Feraheme 510 mg x 1 dose. - Will reevaluate him in 2 months with repeat ferritin, iron panel and CBC.  If continues to be anemic with hemoglobin less than 10, consider ESA.  2.  B12 deficiency: - B12 is 206 and methylmalonic acid is elevated at 562. - He will receive loading B12 injection today and will start taking B12 1 mg tablet daily. - We will recheck his levels in 2 months.      Orders placed this encounter:  Orders Placed This Encounter  Procedures   CBC with Differential/Platelet   Ferritin   Iron and TIBC   Vitamin B12   Methylmalonic acid, serum      Derek Jack, MD [place] [phone]

## 2022-03-17 NOTE — Progress Notes (Signed)
Patient in clinic today for follow up visit. Physician ordered B 12 injection. See MAR for injection information. Patient remained stable throughout injection. Patient discharged from clinic ambulatory and in stable condition.

## 2022-03-25 ENCOUNTER — Inpatient Hospital Stay: Payer: Medicare Other | Attending: Hematology

## 2022-03-25 VITALS — BP 142/49 | HR 71 | Temp 97.4°F | Resp 16

## 2022-03-25 DIAGNOSIS — D509 Iron deficiency anemia, unspecified: Secondary | ICD-10-CM

## 2022-03-25 DIAGNOSIS — D631 Anemia in chronic kidney disease: Secondary | ICD-10-CM | POA: Diagnosis not present

## 2022-03-25 DIAGNOSIS — N184 Chronic kidney disease, stage 4 (severe): Secondary | ICD-10-CM | POA: Insufficient documentation

## 2022-03-25 DIAGNOSIS — E538 Deficiency of other specified B group vitamins: Secondary | ICD-10-CM | POA: Diagnosis not present

## 2022-03-25 MED ORDER — SODIUM CHLORIDE 0.9 % IV SOLN: Freq: Once | INTRAVENOUS | Status: AC

## 2022-03-25 MED ORDER — SODIUM CHLORIDE 0.9 % IV SOLN
510.0000 mg | Freq: Once | INTRAVENOUS | Status: AC
  Administered 2022-03-25: 510 mg via INTRAVENOUS
  Filled 2022-03-25: qty 17

## 2022-03-25 NOTE — Progress Notes (Signed)
Feraheme given today per MD orders. Tolerated infusion without adverse affects. Vital signs stable. No complaints at this time. Discharged from clinic ambulatory in stable condition. Alert and oriented x 3. F/U with [facility] as scheduled.  

## 2022-03-25 NOTE — Patient Instructions (Signed)
[facility]  Discharge Instructions: Thank you for choosing [facility] to provide your oncology and hematology care.  If you have a lab appointment with the Cancer Center, please come in thru the Main Entrance and check in at the main information desk.  Wear comfortable clothing and clothing appropriate for easy access to any Portacath or PICC line.   We strive to give you quality time with your provider. You may need to reschedule your appointment if you arrive late (15 or more minutes).  Arriving late affects you and other patients whose appointments are after yours.  Also, if you miss three or more appointments without notifying the office, you may be dismissed from the clinic at the provider's discretion.      For prescription refill requests, have your pharmacy contact our office and allow 72 hours for refills to be completed.    Today you received the following chemotherapy and/or immunotherapy agents Feraheme. Ferumoxytol Injection What is this medication? FERUMOXYTOL (FER ue MOX i tol) treats low levels of iron in your body (iron deficiency anemia). Iron is a mineral that plays an important role in making red blood cells, which carry oxygen from your lungs to the rest of your body. This medicine may be used for other purposes; ask your health care provider or pharmacist if you have questions. COMMON BRAND NAME(S): Feraheme What should I tell my care team before I take this medication? They need to know if you have any of these conditions: Anemia not caused by low iron levels High levels of iron in the blood Magnetic resonance imaging (MRI) test scheduled An unusual or allergic reaction to iron, other medications, foods, dyes, or preservatives Pregnant or trying to get pregnant Breastfeeding How should I use this medication? This medication is injected into a vein. It is given by your care team in a hospital or clinic setting. Talk to your care  team the use of this medication in children. Special care may be needed. Overdosage: If you think you have taken too much of this medicine contact a poison control center or emergency room at once. NOTE: This medicine is only for you. Do not share this medicine with others. What if I miss a dose? It is important not to miss your dose. Call your care team if you are unable to keep an appointment. What may interact with this medication? Other iron products This list may not describe all possible interactions. Give your health care provider a list of all the medicines, herbs, non-prescription drugs, or dietary supplements you use. Also tell them if you smoke, drink alcohol, or use illegal drugs. Some items may interact with your medicine. What should I watch for while using this medication? Visit your care team regularly. Tell your care team if your symptoms do not start to get better or if they get worse. You may need blood work done while you are taking this medication. You may need to follow a special diet. Talk to your care team. Foods that contain iron include: whole grains/cereals, dried fruits, beans, or peas, leafy green vegetables, and organ meats (liver, kidney). What side effects may I notice from receiving this medication? Side effects that you should report to your care team as soon as possible: Allergic reactions--skin rash, itching, hives, swelling of the face, lips, tongue, or throat Low blood pressure--dizziness, feeling faint or lightheaded, blurry vision Shortness of breath Side effects that usually do not require medical attention (report to your   care team if they continue or are bothersome): Flushing Headache Joint pain Muscle pain Nausea Pain, redness, or irritation at injection site This list may not describe all possible side effects. Call your doctor for medical advice about side effects. You may report side effects to FDA at 1-800-FDA-1088. Where should I keep my  medication? This medication is given in a hospital or clinic and will not be stored at home. NOTE: This sheet is a summary. It may not cover all possible information. If you have questions about this medicine, talk to your doctor, pharmacist, or health care provider.  2023 Elsevier/Gold Standard (2020-07-31 00:00:00)       To help prevent nausea and vomiting after your treatment, we encourage you to take your nausea medication as directed.  BELOW ARE SYMPTOMS THAT SHOULD BE REPORTED IMMEDIATELY: *FEVER GREATER THAN 100.4 F (38 C) OR HIGHER *CHILLS OR SWEATING *NAUSEA AND VOMITING THAT IS NOT CONTROLLED WITH YOUR NAUSEA MEDICATION *UNUSUAL SHORTNESS OF BREATH *UNUSUAL BRUISING OR BLEEDING *URINARY PROBLEMS (pain or burning when urinating, or frequent urination) *BOWEL PROBLEMS (unusual diarrhea, constipation, pain near the anus) TENDERNESS IN MOUTH AND THROAT WITH OR WITHOUT PRESENCE OF ULCERS (sore throat, sores in mouth, or a toothache) UNUSUAL RASH, SWELLING OR PAIN  UNUSUAL VAGINAL DISCHARGE OR ITCHING   Items with * indicate a potential emergency and should be followed up as soon as possible or go to the Emergency Department if any problems should occur.  Please show the CHEMOTHERAPY ALERT CARD or IMMUNOTHERAPY ALERT CARD at check-in to the Emergency Department and triage nurse.  Should you have questions after your visit or need to cancel or reschedule your appointment, please contact [facility] [phone]  and follow the prompts.  Office hours are 8:00 a.m. to 4:30 p.m. Monday - Friday. Please note that voicemails left after 4:00 p.m. may not be returned until the following business day.  We are closed weekends and major holidays. You have access to a nurse at all times for urgent questions. Please call the main number to the clinic [phone] and follow the prompts.  For any non-urgent questions, you may also contact your provider using MyChart. We now  offer e-Visits for anyone 18 and older to request care online for non-urgent symptoms. For details visit [email or web address].   Also download the MyChart app! Go to the app store, search "MyChart", open the app, select [facility], and log in with your MyChart username and password.   

## 2022-04-13 ENCOUNTER — Telehealth (INDEPENDENT_AMBULATORY_CARE_PROVIDER_SITE_OTHER): Payer: Self-pay | Admitting: *Deleted

## 2022-04-13 ENCOUNTER — Encounter (INDEPENDENT_AMBULATORY_CARE_PROVIDER_SITE_OTHER): Payer: Self-pay | Admitting: Gastroenterology

## 2022-04-13 ENCOUNTER — Ambulatory Visit (INDEPENDENT_AMBULATORY_CARE_PROVIDER_SITE_OTHER): Payer: Medicare Other | Admitting: Gastroenterology

## 2022-04-13 VITALS — BP 172/72 | HR 79 | Temp 97.7°F | Ht 69.0 in | Wt 211.7 lb

## 2022-04-13 DIAGNOSIS — D509 Iron deficiency anemia, unspecified: Secondary | ICD-10-CM | POA: Diagnosis not present

## 2022-04-13 MED ORDER — FERROUS SULFATE 325 (65 FE) MG PO TABS: 325.0000 mg | ORAL_TABLET | Freq: Two times a day (BID) | ORAL | 3 refills | Status: AC

## 2022-04-13 NOTE — Progress Notes (Signed)
Devin [name], M.D. Gastroenterology & Hepatology [facility] [address] [place], [place] [address] Primary Care Physician: Caryl Bis, MD [place] [address]  Referring MD: Dr. Theador Hawthorne and PCP  Chief Complaint: Iron deficiency anemia  History of Present Illness: Devin [name] is a 77 y.o. male with past medical history of CKD, diabetes, hypertension, gout and diastolic heart failure, who presents for evaluation of iron deficiency anemia.  Patient reports he was referred for evaluation of iron deficiency anemia, The patient denies having any complaints.  Has not presented any nausea, vomiting, fever, chills, hematochezia, melena, hematemesis, abdominal distention, abdominal pain, diarrhea, jaundice, pruritus or weight loss. Has not presented any shortness of breath or chest pain.  I reviewed the blood workup performed at the patient nephrology office.  The patient was noted to have anemia at least since 03/10/2021 with a hemoglobin of 10.0 was found.  After this, subsequent measurements have fluctuated between 9.0 and 10.5.  Iron studies on 07/11/2021 showed iron of 33 and iron saturation of 11% (in 12/17/2020 these stores were normal).  Notably, most recent CBC on 02/20/2022 showed a hemoglobin of 10.3, MCV 92, WBC 8.5, platelets 322, iron stores with iron of 50, iron saturation of 17%, normal vitamin B12 206, BMP on 03/05/2022 showed a creatinine of 4.02, BUN 32, normal electrolytes.  She had positive fecal occult blood test on 02/26/2022.  Patient received first doses of IV iron on June 9 and 16, 2023. Last time he received IV iron was in late Dec/early Jan. Notably, he reports that he has not taken any oral iron on a chronic basis.  Last AJG:OTLXB Last Colonoscopy:possibly 10  years ago, no reports are available.  FHx: neg for any gastrointestinal/liver disease, no malignancies Social: quit smoking/alcohol 30 years ago, no  illicit drug use Surgical: no abdominal surgeries  Past Medical History: Past Medical History:  Diagnosis Date   Arthritis    CKD (chronic kidney disease) stage 4, GFR 15-29 ml/min (HCC)    Diabetes mellitus without complication ([place])    Gout    Hypertension     Past Surgical History: Past Surgical History:  Procedure Laterality Date   AV FISTULA PLACEMENT Left 12/16/2021   Procedure: LEFT ARM ARTERIOVENOUS (AV) FISTULA CREATION;  Surgeon: Rosetta Posner, MD;  Location: [facility];  Service: Vascular;  Laterality: Left;   KNEE SURGERY     TONSILLECTOMY      Family History: Family History  Family history unknown: Yes    Social History: Social History   Tobacco Use  Smoking Status Former   Years: 20.00   Types: Cigarettes   Quit date: 1993   Years since quitting: 31.0   Passive exposure: Past  Smokeless Tobacco Never   Social History   Substance and Sexual Activity  Alcohol Use Never   Social History   Substance and Sexual Activity  Drug Use Never    Allergies: No Known Allergies  Medications: Current Outpatient Medications  Medication Sig Dispense Refill   amLODipine (NORVASC) 10 MG tablet Take 10 mg by mouth daily.     aspirin EC 81 MG tablet Take 81 mg by mouth daily. Swallow whole.     carvedilol (COREG) 12.5 MG tablet Take 12.5 mg by mouth 2 (two) times daily with a meal.     cloNIDine (CATAPRES) 0.3 MG tablet Take 0.3 mg by mouth in the morning, at noon, and at bedtime.     Cyanocobalamin 1000  MCG SUBL Place under the tongue.     furosemide (LASIX) 40 MG tablet Take 40 mg by mouth 2 (two) times daily.     hydrALAZINE (APRESOLINE) 100 MG tablet Take 100 mg by mouth in the morning, at noon, and at bedtime.     OVER THE COUNTER MEDICATION Vit D - 3 (1000 IU) daily.     epoetin alfa (EPOGEN) 3000 UNIT/ML injection Inject into the skin. (Patient not taking: Reported on 04/13/2022)     No current facility-administered medications for this visit.    Review  of Systems: GENERAL: negative for malaise, night sweats HEENT: No changes in hearing or vision, no nose bleeds or other nasal problems. NECK: Negative for lumps, goiter, pain and significant neck swelling RESPIRATORY: Negative for cough, wheezing CARDIOVASCULAR: Negative for chest pain, leg swelling, palpitations, orthopnea GI: SEE HPI MUSCULOSKELETAL: Negative for joint pain or swelling, back pain, and muscle pain. SKIN: Negative for lesions, rash PSYCH: Negative for sleep disturbance, mood disorder and recent psychosocial stressors. HEMATOLOGY Negative for prolonged bleeding, bruising easily, and swollen nodes. ENDOCRINE: Negative for cold or heat intolerance, polyuria, polydipsia and goiter. NEURO: negative for tremor, gait imbalance, syncope and seizures. The remainder of the review of systems is noncontributory.   Physical Exam: BP (!) 172/72 (BP Location: Right Arm, Patient Position: Sitting, Cuff Size: Large)   Pulse 79   Temp 97.7 F (36.5 C) (Temporal)   Ht 5\' 9"  (1.753 m)   Wt 211 lb 11.2 oz (96 kg)   BMI 31.26 kg/m  GENERAL: The patient is AO x3, in no acute distress. HEENT: Head is normocephalic and atraumatic. EOMI are intact. Mouth is well hydrated and without lesions. NECK: Supple. No masses LUNGS: Clear to auscultation. No presence of rhonchi/wheezing/rales. Adequate chest expansion HEART: RRR, normal s1 and s2. ABDOMEN: Soft, nontender, no guarding, no peritoneal signs, and nondistended. BS +. No masses. EXTREMITIES: Without any cyanosis, clubbing, rash, lesions or edema. NEUROLOGIC: AOx3, no focal motor deficit. SKIN: no jaundice, no rashes   Imaging/Labs: as above  I personally reviewed and interpreted the available labs, imaging and endoscopic files.  Impression and Plan: Devin [name] is a 77 y.o. male with past medical history of CKD, diabetes, hypertension, gout and diastolic heart failure, who presents for evaluation of iron deficiency anemia.  The  patient has presented fluctuation in his hemoglobin with lowest level in the mid nines and low iron saturation stores.  He has not presented any overt gastrointestinal bleeding episodes.  It is possible that his anemia is multifactorial as iron may be contributing to his anemia but also his progressive CKD is likely making this worse.  We will evaluate further his iron losses with an EGD and a colonoscopy, which the patient is agreeable to proceed with.  I will start him today on oral iron supplementation twice daily.  The patient was found to have elevated blood pressure when vital signs were checked in the office. The blood pressure was rechecked by the nursing staff and it was found be persistently elevated >140/90 mmHg. I personally advised to the patient to follow up closely with PCP for hypertension control.  -Schedule EGD and colonoscopy -Start ferrous sulfate 325 mg BID  All questions were answered.      Devin Peppers, MD Gastroenterology and Hepatology [facility]

## 2022-04-13 NOTE — Telephone Encounter (Signed)
Offered pt appt in February and date did not work. He will be called once we get March schedule

## 2022-04-13 NOTE — Patient Instructions (Addendum)
Schedule EGD and colonoscopy Start ferrous sulfate 325 mg BID The patient was found to have elevated blood pressure when vital signs were checked in the office. The blood pressure was rechecked by the nursing staff and it was found be persistently elevated >140/90 mmHg. I personally advised to the patient to follow up closely with PCP for hypertension control.

## 2022-04-21 ENCOUNTER — Encounter (INDEPENDENT_AMBULATORY_CARE_PROVIDER_SITE_OTHER): Payer: Self-pay

## 2022-04-21 DIAGNOSIS — I10 Essential (primary) hypertension: Secondary | ICD-10-CM | POA: Diagnosis not present

## 2022-04-21 DIAGNOSIS — E7849 Other hyperlipidemia: Secondary | ICD-10-CM | POA: Diagnosis not present

## 2022-04-21 DIAGNOSIS — N2581 Secondary hyperparathyroidism of renal origin: Secondary | ICD-10-CM | POA: Diagnosis not present

## 2022-04-21 DIAGNOSIS — N184 Chronic kidney disease, stage 4 (severe): Secondary | ICD-10-CM | POA: Diagnosis not present

## 2022-04-21 DIAGNOSIS — D519 Vitamin B12 deficiency anemia, unspecified: Secondary | ICD-10-CM | POA: Diagnosis not present

## 2022-04-21 DIAGNOSIS — R5382 Chronic fatigue, unspecified: Secondary | ICD-10-CM | POA: Diagnosis not present

## 2022-04-21 DIAGNOSIS — E1165 Type 2 diabetes mellitus with hyperglycemia: Secondary | ICD-10-CM | POA: Diagnosis not present

## 2022-04-21 DIAGNOSIS — E782 Mixed hyperlipidemia: Secondary | ICD-10-CM | POA: Diagnosis not present

## 2022-04-21 DIAGNOSIS — E559 Vitamin D deficiency, unspecified: Secondary | ICD-10-CM | POA: Diagnosis not present

## 2022-04-21 DIAGNOSIS — D649 Anemia, unspecified: Secondary | ICD-10-CM | POA: Diagnosis not present

## 2022-04-22 DIAGNOSIS — N184 Chronic kidney disease, stage 4 (severe): Secondary | ICD-10-CM | POA: Diagnosis not present

## 2022-04-22 DIAGNOSIS — I10 Essential (primary) hypertension: Secondary | ICD-10-CM | POA: Diagnosis not present

## 2022-04-22 DIAGNOSIS — E782 Mixed hyperlipidemia: Secondary | ICD-10-CM | POA: Diagnosis not present

## 2022-04-24 DIAGNOSIS — E559 Vitamin D deficiency, unspecified: Secondary | ICD-10-CM | POA: Diagnosis not present

## 2022-04-24 DIAGNOSIS — D519 Vitamin B12 deficiency anemia, unspecified: Secondary | ICD-10-CM | POA: Diagnosis not present

## 2022-04-24 DIAGNOSIS — N184 Chronic kidney disease, stage 4 (severe): Secondary | ICD-10-CM | POA: Diagnosis not present

## 2022-04-24 DIAGNOSIS — I10 Essential (primary) hypertension: Secondary | ICD-10-CM | POA: Diagnosis not present

## 2022-04-24 DIAGNOSIS — Z0001 Encounter for general adult medical examination with abnormal findings: Secondary | ICD-10-CM | POA: Diagnosis not present

## 2022-04-24 DIAGNOSIS — E538 Deficiency of other specified B group vitamins: Secondary | ICD-10-CM | POA: Diagnosis not present

## 2022-04-24 DIAGNOSIS — E7849 Other hyperlipidemia: Secondary | ICD-10-CM | POA: Diagnosis not present

## 2022-04-24 DIAGNOSIS — E1122 Type 2 diabetes mellitus with diabetic chronic kidney disease: Secondary | ICD-10-CM | POA: Diagnosis not present

## 2022-04-24 DIAGNOSIS — R809 Proteinuria, unspecified: Secondary | ICD-10-CM | POA: Diagnosis not present

## 2022-04-24 DIAGNOSIS — N2581 Secondary hyperparathyroidism of renal origin: Secondary | ICD-10-CM | POA: Diagnosis not present

## 2022-05-04 DIAGNOSIS — R809 Proteinuria, unspecified: Secondary | ICD-10-CM | POA: Diagnosis not present

## 2022-05-04 DIAGNOSIS — I129 Hypertensive chronic kidney disease with stage 1 through stage 4 chronic kidney disease, or unspecified chronic kidney disease: Secondary | ICD-10-CM | POA: Diagnosis not present

## 2022-05-04 DIAGNOSIS — E1129 Type 2 diabetes mellitus with other diabetic kidney complication: Secondary | ICD-10-CM | POA: Diagnosis not present

## 2022-05-04 DIAGNOSIS — I5032 Chronic diastolic (congestive) heart failure: Secondary | ICD-10-CM | POA: Diagnosis not present

## 2022-05-04 DIAGNOSIS — E211 Secondary hyperparathyroidism, not elsewhere classified: Secondary | ICD-10-CM | POA: Diagnosis not present

## 2022-05-04 DIAGNOSIS — D631 Anemia in chronic kidney disease: Secondary | ICD-10-CM | POA: Diagnosis not present

## 2022-05-04 DIAGNOSIS — N189 Chronic kidney disease, unspecified: Secondary | ICD-10-CM | POA: Diagnosis not present

## 2022-05-04 DIAGNOSIS — E1122 Type 2 diabetes mellitus with diabetic chronic kidney disease: Secondary | ICD-10-CM | POA: Diagnosis not present

## 2022-05-07 MED ORDER — PEG 3350-KCL-NA BICARB-NACL 420 G PO SOLR: 4000.0000 mL | Freq: Once | ORAL | 0 refills | Status: AC

## 2022-05-07 NOTE — Telephone Encounter (Signed)
Called pt and spoke with spouse. She is aware of his pre-op appt details and will inform pt.

## 2022-05-07 NOTE — Telephone Encounter (Signed)
PA approved via [facility]. Auth# [id number], DOS: May 26, 2022 - Aug 24, 2022

## 2022-05-07 NOTE — Telephone Encounter (Signed)
Spoke with pt. He scheduled for TCS/EGD 3/5 at 2pm. Aware will send instructions to him and will let know when his pre-op appt is. Rx for prep sent to [facility]. Aware to hold iron x 7 days prior.

## 2022-05-07 NOTE — Addendum Note (Signed)
Addended by: Cheron Every on: 05/07/2022 02:52 PM   Modules accepted: Orders

## 2022-05-12 ENCOUNTER — Inpatient Hospital Stay: Payer: Medicare Other | Attending: Hematology

## 2022-05-12 DIAGNOSIS — Z7982 Long term (current) use of aspirin: Secondary | ICD-10-CM | POA: Diagnosis not present

## 2022-05-12 DIAGNOSIS — E538 Deficiency of other specified B group vitamins: Secondary | ICD-10-CM | POA: Diagnosis not present

## 2022-05-12 DIAGNOSIS — N184 Chronic kidney disease, stage 4 (severe): Secondary | ICD-10-CM | POA: Diagnosis not present

## 2022-05-12 DIAGNOSIS — D631 Anemia in chronic kidney disease: Secondary | ICD-10-CM | POA: Insufficient documentation

## 2022-05-12 DIAGNOSIS — Z79899 Other long term (current) drug therapy: Secondary | ICD-10-CM | POA: Diagnosis not present

## 2022-05-12 DIAGNOSIS — D649 Anemia, unspecified: Secondary | ICD-10-CM

## 2022-05-12 DIAGNOSIS — Z87891 Personal history of nicotine dependence: Secondary | ICD-10-CM | POA: Insufficient documentation

## 2022-05-12 LAB — CBC WITH DIFFERENTIAL/PLATELET
Abs Immature Granulocytes: 0.11 10*3/uL — ABNORMAL HIGH (ref 0.00–0.07)
Basophils Absolute: 0 10*3/uL (ref 0.0–0.1)
Basophils Relative: 0 %
Eosinophils Absolute: 0.2 10*3/uL (ref 0.0–0.5)
Eosinophils Relative: 1 %
HCT: 34.1 % — ABNORMAL LOW (ref 39.0–52.0)
Hemoglobin: 11.1 g/dL — ABNORMAL LOW (ref 13.0–17.0)
Immature Granulocytes: 1 %
Lymphocytes Relative: 10 %
Lymphs Abs: 2 10*3/uL (ref 0.7–4.0)
MCH: 29.8 pg (ref 26.0–34.0)
MCHC: 32.6 g/dL (ref 30.0–36.0)
MCV: 91.7 fL (ref 80.0–100.0)
Monocytes Absolute: 1.3 10*3/uL — ABNORMAL HIGH (ref 0.1–1.0)
Monocytes Relative: 7 %
Neutro Abs: 16.1 10*3/uL — ABNORMAL HIGH (ref 1.7–7.7)
Neutrophils Relative %: 81 %
Platelets: 256 10*3/uL (ref 150–400)
RBC: 3.72 MIL/uL — ABNORMAL LOW (ref 4.22–5.81)
RDW: 14 % (ref 11.5–15.5)
WBC: 19.8 10*3/uL — ABNORMAL HIGH (ref 4.0–10.5)
nRBC: 0 % (ref 0.0–0.2)

## 2022-05-12 LAB — VITAMIN B12: Vitamin B-12: 1231 pg/mL — ABNORMAL HIGH (ref 180–914)

## 2022-05-12 LAB — IRON AND TIBC
Iron: 19 ug/dL — ABNORMAL LOW (ref 45–182)
Saturation Ratios: 7 % — ABNORMAL LOW (ref 17.9–39.5)
TIBC: 255 ug/dL (ref 250–450)
UIBC: 236 ug/dL

## 2022-05-12 LAB — FERRITIN: Ferritin: 332 ng/mL (ref 24–336)

## 2022-05-19 ENCOUNTER — Inpatient Hospital Stay: Payer: Medicare Other | Admitting: Hematology

## 2022-05-19 VITALS — BP 139/67 | HR 70 | Temp 97.7°F | Resp 18 | Ht 69.0 in | Wt 206.2 lb

## 2022-05-19 DIAGNOSIS — D649 Anemia, unspecified: Secondary | ICD-10-CM | POA: Diagnosis not present

## 2022-05-19 DIAGNOSIS — Z7982 Long term (current) use of aspirin: Secondary | ICD-10-CM | POA: Diagnosis not present

## 2022-05-19 DIAGNOSIS — Z79899 Other long term (current) drug therapy: Secondary | ICD-10-CM | POA: Diagnosis not present

## 2022-05-19 DIAGNOSIS — D631 Anemia in chronic kidney disease: Secondary | ICD-10-CM | POA: Diagnosis not present

## 2022-05-19 DIAGNOSIS — D509 Iron deficiency anemia, unspecified: Secondary | ICD-10-CM | POA: Diagnosis not present

## 2022-05-19 DIAGNOSIS — N184 Chronic kidney disease, stage 4 (severe): Secondary | ICD-10-CM | POA: Diagnosis not present

## 2022-05-19 DIAGNOSIS — E538 Deficiency of other specified B group vitamins: Secondary | ICD-10-CM | POA: Diagnosis not present

## 2022-05-19 DIAGNOSIS — Z87891 Personal history of nicotine dependence: Secondary | ICD-10-CM | POA: Diagnosis not present

## 2022-05-19 LAB — METHYLMALONIC ACID, SERUM: Methylmalonic Acid, Quantitative: 319 nmol/L (ref 0–378)

## 2022-05-19 NOTE — Patient Instructions (Signed)
[place] at [facility] Discharge Instructions   You were seen and examined today by Dr. Delton Coombes.  He reviewed the results of your lab work. Your hemoglobin has improved to 11.   Your stool cards were positive for blood. Have colonoscopy as scheduled.  You may continue iron pills after the colonoscopy.   Continue B12 as prescribed.   We will see you back in 3 months. We will repeat lab work prior to this visit.    Thank you for choosing [place] at [facility] to provide your oncology and hematology care.  To afford each patient quality time with our provider, please arrive at least 15 minutes before your scheduled appointment time.   If you have a lab appointment with the [place] please come in thru the Main Entrance and check in at the main information desk.  You need to re-schedule your appointment should you arrive 10 or more minutes late.  We strive to give you quality time with our providers, and arriving late affects you and other patients whose appointments are after yours.  Also, if you no show three or more times for appointments you may be dismissed from the clinic at the providers discretion.     Again, thank you for choosing [facility].  Our hope is that these requests will decrease the amount of time that you wait before being seen by our physicians.       _____________________________________________________________  Should you have questions after your visit to [facility], please contact our office at [phone] and follow the prompts.  Our office hours are 8:00 a.m. and 4:30 p.m. Monday - Friday.  Please note that voicemails left after 4:00 p.m. may not be returned until the following business day.  We are closed weekends and major holidays.  You do have access to a nurse 24-7, just call the main number to the clinic [phone] and do not press any options, hold on the line and a  nurse will answer the phone.    For prescription refill requests, have your pharmacy contact our office and allow 72 hours.    Due to Covid, you will need to wear a mask upon entering the hospital. If you do not have a mask, a mask will be given to you at the Main Entrance upon arrival. For doctor visits, patients may have 1 support person age 79 or older with them. For treatment visits, patients can not have anyone with them due to social distancing guidelines and our immunocompromised population.

## 2022-05-19 NOTE — Progress Notes (Signed)
[address] [place], [place] [address]   CLINIC:  Medical Oncology/Hematology  PCP:  Caryl Bis, MD [place] [address] [phone]   REASON FOR VISIT:  Follow-up for normocytic anemia from chronic inflammation and B12 deficiency  PRIOR THERAPY: Parenteral iron therapy  CURRENT THERAPY: Parenteral iron and B12  INTERVAL HISTORY:  Devin [name] 77 y.o. male seen for follow-up of normocytic anemia.  He received Feraheme infusion on 03/25/2022 and reported improvement in energy levels.  Energy is about 70%.  Denies any bleeding per rectum or melena.  However his stool cards were positive.  He reports taking B12 1 mg tablet daily.   REVIEW OF SYSTEMS:  Review of Systems  All other systems reviewed and are negative.    PAST MEDICAL/SURGICAL HISTORY:  Past Medical History:  Diagnosis Date   Arthritis    CKD (chronic kidney disease) stage 4, GFR 15-29 ml/min (HCC)    Diabetes mellitus without complication ([place])    Gout    Hypertension    Past Surgical History:  Procedure Laterality Date   AV FISTULA PLACEMENT Left 12/16/2021   Procedure: LEFT ARM ARTERIOVENOUS (AV) FISTULA CREATION;  Surgeon: Rosetta Posner, MD;  Location: [facility];  Service: Vascular;  Laterality: Left;   KNEE SURGERY     TONSILLECTOMY       SOCIAL HISTORY:  Social History   Socioeconomic History   Marital status: Married    Spouse name: Not on file   Number of children: Not on file   Years of education: Not on file   Highest education level: Not on file  Occupational History   Not on file  Tobacco Use   Smoking status: Former    Years: 20.00    Types: Cigarettes    Quit date: 26    Years since quitting: 31.1    Passive exposure: Past   Smokeless tobacco: Never  Vaping Use   Vaping Use: Never used  Substance and Sexual Activity   Alcohol use: Never   Drug use: Never   Sexual activity: Yes  Other Topics Concern   Not on file  Social History  Narrative   Not on file   Social Determinants of Health   Financial Resource Strain: Not on file  Food Insecurity: Not on file  Transportation Needs: Not on file  Physical Activity: Not on file  Stress: Not on file  Social Connections: Not on file  Intimate Partner Violence: Not on file    FAMILY HISTORY:  Family History  Family history unknown: Yes    CURRENT MEDICATIONS:  Outpatient Encounter Medications as of 05/19/2022  Medication Sig   amLODipine (NORVASC) 10 MG tablet Take 10 mg by mouth daily.   aspirin EC 81 MG tablet Take 81 mg by mouth daily. Swallow whole.   carvedilol (COREG) 12.5 MG tablet Take 12.5 mg by mouth 2 (two) times daily with a meal.   cloNIDine (CATAPRES) 0.3 MG tablet Take 0.3 mg by mouth in the morning, at noon, and at bedtime.   Cyanocobalamin 1000 MCG SUBL Place under the tongue.   epoetin alfa (EPOGEN) 3000 UNIT/ML injection Inject into the skin.   ferrous sulfate 325 (65 FE) MG tablet Take 1 tablet (325 mg total) by mouth 2 (two) times daily with a meal.   furosemide (LASIX) 40 MG tablet Take 40 mg by mouth 2 (two) times daily.   hydrALAZINE (APRESOLINE) 100 MG tablet Take 100 mg by  mouth in the morning, at noon, and at bedtime.   OVER THE COUNTER MEDICATION Vit D - 3 (1000 IU) daily.   No facility-administered encounter medications on file as of 05/19/2022.    ALLERGIES:  No Known Allergies   PHYSICAL EXAM:  ECOG Performance status: 1  Vitals:   05/19/22 1452  BP: 139/67  Pulse: 70  Resp: 18  Temp: 97.7 F (36.5 C)  SpO2: 98%   Filed Weights   05/19/22 1452  Weight: 206 lb 3.2 oz (93.5 kg)   Physical Exam Vitals reviewed.  Constitutional:      Appearance: Normal appearance.  Cardiovascular:     Rate and Rhythm: Normal rate and regular rhythm.     Heart sounds: Normal heart sounds.  Pulmonary:     Effort: Pulmonary effort is normal.     Breath sounds: Normal breath sounds.  Abdominal:     Palpations: Abdomen is soft.   Neurological:     Mental Status: He is alert.  Psychiatric:        Mood and Affect: Mood normal.        Behavior: Behavior normal.      LABORATORY DATA:  I have reviewed the labs as listed.  CBC    Component Value Date/Time   WBC 19.8 (H) 05/12/2022 1245   RBC 3.72 (L) 05/12/2022 1245   HGB 11.1 (L) 05/12/2022 1245   HCT 34.1 (L) 05/12/2022 1245   PLT 256 05/12/2022 1245   MCV 91.7 05/12/2022 1245   MCH 29.8 05/12/2022 1245   MCHC 32.6 05/12/2022 1245   RDW 14.0 05/12/2022 1245   LYMPHSABS 2.0 05/12/2022 1245   MONOABS 1.3 (H) 05/12/2022 1245   EOSABS 0.2 05/12/2022 1245   BASOSABS 0.0 05/12/2022 1245      Latest Ref Rng & Units 03/05/2022   10:58 AM 12/16/2021    7:01 AM 11/28/2021    4:51 AM  CMP  Glucose 70 - 99 mg/dL 112  139  147   BUN 8 - 23 mg/dL 32  35  44   Creatinine 0.61 - 1.24 mg/dL 4.02  3.86  4.55   Sodium 135 - 145 mmol/L 138  140  137   Potassium 3.5 - 5.1 mmol/L 4.3  3.9  4.2   Chloride 98 - 111 mmol/L 106  108  107   CO2 22 - 32 mmol/L '22  20  23   '$ Calcium 8.9 - 10.3 mg/dL 8.6  8.8  8.2     DIAGNOSTIC IMAGING:  I have independently reviewed the scans and discussed with the patient.  ASSESSMENT:  1.  Normocytic anemia: - Patient seen at the request of Dr. Theador Hawthorne - CBC (12/15/2021): Hb-10.5, MCV-88, PLT-393, WBC-10.9, creatinine 3.87 - Denies any bleeding per rectum or melena.  Denies any ice pica. - Takes B12 1 mg tablet daily. - He has received parenteral iron therapy in June 2023 in [facility] for a total dose of 1 g. - Denies any previous transfusion history.   2.  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  Quit smoking cigarettes 30 years ago. - Father had prostate cancer.  PLAN:  1.  Normocytic anemia: - Combination anemia from CKD, relative iron deficiency and B12 deficiency. - He had stool cards positive for occult blood.  He is scheduled for colonoscopy on 05/26/2022. -  He received Feraheme on 03/25/2022. - We reviewed labs from last week.  Ferritin is 332.  Methylmalonic acid and B12 are normal.  Hemoglobin improved to 11.1 from 10.3 previously. - RTC 3 months with repeat CBC, ferritin and iron panel.  Will also check free light chains at next visit.  2.  B12 deficiency: - Continue B12 1 mg tablet daily.  B12 and MMA levels are normal.      Orders placed this encounter:  Orders Placed This Encounter  Procedures   CBC with Differential   Iron and TIBC ([facility] ONLY)   Ferritin   Kappa/lambda light chains      Derek Jack, MD [place] [phone]

## 2022-05-21 NOTE — Patient Instructions (Signed)
Devin [name]  05/21/2022     '@PREFPERIOPPHARMACY'$ @   Your procedure is scheduled on  05/26/2022.   Report to [facility] at  1200 P.M.   Call this number if you have problems the morning of surgery:  [phone]  If you experience any cold or flu symptoms such as cough, fever, chills, shortness of breath, etc. between now and your scheduled surgery, please notify us at the above number.   Remember:  Follow the diet and prep instructions given to you by the office.      DO NOT take any medications for diabetes the morning of your procedure.     Take these medicines the morning of surgery with A SIP OF WATER                                carvedilol, clonidine.    Do not wear jewelry, make-up or nail polish.  Do not wear lotions, powders, or perfumes, or deodorant.  Do not shave 48 hours prior to surgery.  Men may shave face and neck.  Do not bring valuables to the hospital.  [facility] is not responsible for any belongings or valuables.  Contacts, dentures or bridgework may not be worn into surgery.  Leave your suitcase in the car.  After surgery it may be brought to your room.  For patients admitted to the hospital, discharge time will be determined by your treatment team.  Patients discharged the day of surgery will not be allowed to drive home and must  have someone with them for 24 hours.    Special instructions:   DO NOT smoke tobacco or vape for 24 hours before your procedure.  Please read over the following fact sheets that you were given. Anesthesia Post-op Instructions and Care and Recovery After Surgery      Upper Endoscopy, Adult, Care After After the procedure, it is common to have a sore throat. It is also common to have: Mild stomach pain or discomfort. Bloating. Nausea. Follow these instructions at home: The instructions below may help you care for yourself at home. Your health care provider may give you more instructions. If you have  questions, ask your health care provider. If you were given a sedative during the procedure, it can affect you for several hours. Do not drive or operate machinery until your health care provider says that it is safe. If you will be going home right after the procedure, plan to have a responsible adult: Take you home from the hospital or clinic. You will not be allowed to drive. Care for you for the time you are told. Follow instructions from your health care provider about what you may eat and drink. Return to your normal activities as told by your health care provider. Ask your health care provider what activities are safe for you. Take over-the-counter and prescription medicines only as told by your health care provider. Contact a health care provider if you: Have a sore throat that lasts longer than one day. Have trouble swallowing. Have a fever. Get help right away if you: Vomit blood or your vomit looks like coffee grounds. Have bloody, black, or tarry stools. Have a very bad sore throat or you cannot swallow. Have difficulty breathing or very bad pain in your chest or abdomen. These symptoms may be an emergency. Get help right away. Call 911. Do not wait  to see if the symptoms will go away. Do not drive yourself to the hospital. Summary After the procedure, it is common to have a sore throat, mild stomach discomfort, bloating, and nausea. If you were given a sedative during the procedure, it can affect you for several hours. Do not drive until your health care provider says that it is safe. Follow instructions from your health care provider about what you may eat and drink. Return to your normal activities as told by your health care provider. This information is not intended to replace advice given to you by your health care provider. Make sure you discuss any questions you have with your health care provider. Document Revised: 06/18/2021 Document Reviewed: 06/18/2021 Elsevier  Patient Education  [place]. Colonoscopy, Adult, Care After The following information offers guidance on how to care for yourself after your procedure. Your health care provider may also give you more specific instructions. If you have problems or questions, contact your health care provider. What can I expect after the procedure? After the procedure, it is common to have: A small amount of blood in your stool for 24 hours after the procedure. Some gas. Mild cramping or bloating of your abdomen. Follow these instructions at home: Eating and drinking  Drink enough fluid to keep your urine pale yellow. Follow instructions from your health care provider about eating or drinking restrictions. Resume your normal diet as told by your health care provider. Avoid heavy or fried foods that are hard to digest. Activity Rest as told by your health care provider. Avoid sitting for a long time without moving. Get up to take short walks every 1-2 hours. This is important to improve blood flow and breathing. Ask for help if you feel weak or unsteady. Return to your normal activities as told by your health care provider. Ask your health care provider what activities are safe for you. Managing cramping and bloating  Try walking around when you have cramps or feel bloated. If directed, apply heat to your abdomen as told by your health care provider. Use the heat source that your health care provider recommends, such as a moist heat pack or a heating pad. Place a towel between your skin and the heat source. Leave the heat on for 20-30 minutes. Remove the heat if your skin turns bright red. This is especially important if you are unable to feel pain, heat, or cold. You have a greater risk of getting burned. General instructions If you were given a sedative during the procedure, it can affect you for several hours. Do not drive or operate machinery until your health care provider says that it is  safe. For the first 24 hours after the procedure: Do not sign important documents. Do not drink alcohol. Do your regular daily activities at a slower pace than normal. Eat soft foods that are easy to digest. Take over-the-counter and prescription medicines only as told by your health care provider. Keep all follow-up visits. This is important. Contact a health care provider if: You have blood in your stool 2-3 days after the procedure. Get help right away if: You have more than a small spotting of blood in your stool. You have large blood clots in your stool. You have swelling of your abdomen. You have nausea or vomiting. You have a fever. You have increasing pain in your abdomen that is not relieved with medicine. These symptoms may be an emergency. Get help right away. Call 911. Do not wait  to see if the symptoms will go away. Do not drive yourself to the hospital. Summary After the procedure, it is common to have a small amount of blood in your stool. You may also have mild cramping and bloating of your abdomen. If you were given a sedative during the procedure, it can affect you for several hours. Do not drive or operate machinery until your health care provider says that it is safe. Get help right away if you have a lot of blood in your stool, nausea or vomiting, a fever, or increased pain in your abdomen. This information is not intended to replace advice given to you by your health care provider. Make sure you discuss any questions you have with your health care provider. Document Revised: 10/30/2020 Document Reviewed: 10/30/2020 Elsevier Patient Education  [place] After The following information offers guidance on how to care for yourself after your procedure. Your health care provider may also give you more specific instructions. If you have problems or questions, contact your health care provider. What can I expect after the  procedure? After the procedure, it is common to have: Tiredness. Little or no memory about what happened during or after the procedure. Impaired judgment when it comes to making decisions. Nausea or vomiting. Some trouble with balance. Follow these instructions at home: For the time period you were told by your health care provider:  Rest. Do not participate in activities where you could fall or become injured. Do not drive or use machinery. Do not drink alcohol. Do not take sleeping pills or medicines that cause drowsiness. Do not make important decisions or sign legal documents. Do not take care of children on your own. Medicines Take over-the-counter and prescription medicines only as told by your health care provider. If you were prescribed antibiotics, take them as told by your health care provider. Do not stop using the antibiotic even if you start to feel better. Eating and drinking Follow instructions from your health care provider about what you may eat and drink. Drink enough fluid to keep your urine pale yellow. If you vomit: Drink clear fluids slowly and in small amounts as you are able. Clear fluids include water, ice chips, low-calorie sports drinks, and fruit juice that has water added to it (diluted fruit juice). Eat light and bland foods in small amounts as you are able. These foods include bananas, applesauce, rice, lean meats, toast, and crackers. General instructions  Have a responsible adult stay with you for the time you are told. It is important to have someone help care for you until you are awake and alert. If you have sleep apnea, surgery and some medicines can increase your risk for breathing problems. Follow instructions from your health care provider about wearing your sleep device: When you are sleeping. This includes during daytime naps. While taking prescription pain medicines, sleeping medicines, or medicines that make you drowsy. Do not use any  products that contain nicotine or tobacco. These products include cigarettes, chewing tobacco, and vaping devices, such as e-cigarettes. If you need help quitting, ask your health care provider. Contact a health care provider if: You feel nauseous or vomit every time you eat or drink. You feel light-headed. You are still sleepy or having trouble with balance after 24 hours. You get a rash. You have a fever. You have redness or swelling around the IV site. Get help right away if: You have trouble breathing. You have new confusion after  you get home. These symptoms may be an emergency. Get help right away. Call 911. Do not wait to see if the symptoms will go away. Do not drive yourself to the hospital. This information is not intended to replace advice given to you by your health care provider. Make sure you discuss any questions you have with your health care provider. Document Revised: 08/04/2021 Document Reviewed: 08/04/2021 Elsevier Patient Education  [place].

## 2022-05-22 ENCOUNTER — Encounter (HOSPITAL_COMMUNITY)
Admission: RE | Admit: 2022-05-22 | Discharge: 2022-05-22 | Disposition: A | Discharge status: Home / Self Care | Payer: Medicare Other | Source: Ambulatory Visit | Attending: Gastroenterology | Admitting: Gastroenterology

## 2022-05-22 VITALS — BP 165/53 | HR 67 | Temp 97.7°F | Resp 18 | Ht 69.0 in | Wt 207.0 lb

## 2022-05-22 DIAGNOSIS — Z01812 Encounter for preprocedural laboratory examination: Secondary | ICD-10-CM | POA: Insufficient documentation

## 2022-05-22 DIAGNOSIS — E119 Type 2 diabetes mellitus without complications: Secondary | ICD-10-CM | POA: Insufficient documentation

## 2022-05-22 LAB — BASIC METABOLIC PANEL
Anion gap: 7 (ref 5–15)
BUN: 56 mg/dL — ABNORMAL HIGH (ref 8–23)
CO2: 22 mmol/L (ref 22–32)
Calcium: 8.4 mg/dL — ABNORMAL LOW (ref 8.9–10.3)
Chloride: 106 mmol/L (ref 98–111)
Creatinine, Ser: 4.33 mg/dL — ABNORMAL HIGH (ref 0.61–1.24)
GFR, Estimated: 13 mL/min — ABNORMAL LOW (ref >60)
Glucose, Bld: 152 mg/dL — ABNORMAL HIGH (ref 70–99)
Potassium: 3.8 mmol/L (ref 3.5–5.1)
Sodium: 135 mmol/L (ref 135–145)

## 2022-05-26 ENCOUNTER — Ambulatory Visit (HOSPITAL_COMMUNITY): Payer: Medicare Other | Admitting: Registered Nurse

## 2022-05-26 ENCOUNTER — Ambulatory Visit (HOSPITAL_BASED_OUTPATIENT_CLINIC_OR_DEPARTMENT_OTHER): Payer: Medicare Other | Admitting: Registered Nurse

## 2022-05-26 ENCOUNTER — Encounter (HOSPITAL_COMMUNITY)
Admission: RE | Disposition: A | Discharge status: Home / Self Care | Payer: Self-pay | Source: Home / Self Care | Attending: Gastroenterology

## 2022-05-26 ENCOUNTER — Encounter (INDEPENDENT_AMBULATORY_CARE_PROVIDER_SITE_OTHER): Payer: Self-pay | Admitting: *Deleted

## 2022-05-26 ENCOUNTER — Encounter (HOSPITAL_COMMUNITY): Payer: Self-pay | Admitting: Gastroenterology

## 2022-05-26 ENCOUNTER — Ambulatory Visit (HOSPITAL_COMMUNITY)
Admission: RE | Admit: 2022-05-26 | Discharge: 2022-05-26 | Disposition: A | Discharge status: Home / Self Care | Payer: Medicare Other | Attending: Gastroenterology | Admitting: Gastroenterology

## 2022-05-26 ENCOUNTER — Other Ambulatory Visit: Payer: Self-pay

## 2022-05-26 DIAGNOSIS — Z79899 Other long term (current) drug therapy: Secondary | ICD-10-CM | POA: Diagnosis not present

## 2022-05-26 DIAGNOSIS — D120 Benign neoplasm of cecum: Secondary | ICD-10-CM | POA: Insufficient documentation

## 2022-05-26 DIAGNOSIS — I120 Hypertensive chronic kidney disease with stage 5 chronic kidney disease or end stage renal disease: Secondary | ICD-10-CM | POA: Diagnosis not present

## 2022-05-26 DIAGNOSIS — K2950 Unspecified chronic gastritis without bleeding: Secondary | ICD-10-CM | POA: Insufficient documentation

## 2022-05-26 DIAGNOSIS — K648 Other hemorrhoids: Secondary | ICD-10-CM

## 2022-05-26 DIAGNOSIS — M109 Gout, unspecified: Secondary | ICD-10-CM | POA: Insufficient documentation

## 2022-05-26 DIAGNOSIS — I5032 Chronic diastolic (congestive) heart failure: Secondary | ICD-10-CM | POA: Diagnosis not present

## 2022-05-26 DIAGNOSIS — K5730 Diverticulosis of large intestine without perforation or abscess without bleeding: Secondary | ICD-10-CM

## 2022-05-26 DIAGNOSIS — N184 Chronic kidney disease, stage 4 (severe): Secondary | ICD-10-CM | POA: Insufficient documentation

## 2022-05-26 DIAGNOSIS — K2289 Other specified disease of esophagus: Secondary | ICD-10-CM | POA: Insufficient documentation

## 2022-05-26 DIAGNOSIS — N186 End stage renal disease: Secondary | ICD-10-CM

## 2022-05-26 DIAGNOSIS — Z09 Encounter for follow-up examination after completed treatment for conditions other than malignant neoplasm: Secondary | ICD-10-CM | POA: Diagnosis not present

## 2022-05-26 DIAGNOSIS — K449 Diaphragmatic hernia without obstruction or gangrene: Secondary | ICD-10-CM | POA: Diagnosis not present

## 2022-05-26 DIAGNOSIS — K635 Polyp of colon: Secondary | ICD-10-CM | POA: Diagnosis not present

## 2022-05-26 DIAGNOSIS — E1122 Type 2 diabetes mellitus with diabetic chronic kidney disease: Secondary | ICD-10-CM | POA: Diagnosis not present

## 2022-05-26 DIAGNOSIS — D509 Iron deficiency anemia, unspecified: Secondary | ICD-10-CM

## 2022-05-26 DIAGNOSIS — E1165 Type 2 diabetes mellitus with hyperglycemia: Secondary | ICD-10-CM | POA: Diagnosis not present

## 2022-05-26 DIAGNOSIS — K317 Polyp of stomach and duodenum: Secondary | ICD-10-CM

## 2022-05-26 DIAGNOSIS — D128 Benign neoplasm of rectum: Secondary | ICD-10-CM

## 2022-05-26 DIAGNOSIS — Z87891 Personal history of nicotine dependence: Secondary | ICD-10-CM | POA: Insufficient documentation

## 2022-05-26 DIAGNOSIS — I130 Hypertensive heart and chronic kidney disease with heart failure and stage 1 through stage 4 chronic kidney disease, or unspecified chronic kidney disease: Secondary | ICD-10-CM | POA: Diagnosis not present

## 2022-05-26 DIAGNOSIS — K5790 Diverticulosis of intestine, part unspecified, without perforation or abscess without bleeding: Secondary | ICD-10-CM

## 2022-05-26 DIAGNOSIS — K621 Rectal polyp: Secondary | ICD-10-CM | POA: Diagnosis not present

## 2022-05-26 HISTORY — PX: BIOPSY: SHX5522

## 2022-05-26 HISTORY — PX: COLONOSCOPY WITH PROPOFOL: SHX5780

## 2022-05-26 HISTORY — PX: ESOPHAGOGASTRODUODENOSCOPY (EGD) WITH PROPOFOL: SHX5813

## 2022-05-26 HISTORY — PX: HEMOSTASIS CLIP PLACEMENT: SHX6857

## 2022-05-26 HISTORY — PX: POLYPECTOMY: SHX5525

## 2022-05-26 LAB — GLUCOSE, CAPILLARY: Glucose-Capillary: 126 mg/dL — ABNORMAL HIGH (ref 70–99)

## 2022-05-26 LAB — HM COLONOSCOPY

## 2022-05-26 SURGERY — COLONOSCOPY WITH PROPOFOL
Anesthesia: General

## 2022-05-26 MED ORDER — PANTOPRAZOLE SODIUM 40 MG PO TBEC: DELAYED_RELEASE_TABLET | ORAL | 0 refills | Status: DC

## 2022-05-26 MED ORDER — PROPOFOL 500 MG/50ML IV EMUL
INTRAVENOUS | Status: DC | PRN
  Administered 2022-05-26: 125 ug/kg/min via INTRAVENOUS

## 2022-05-26 MED ORDER — LACTATED RINGERS IV SOLN: INTRAVENOUS | Status: DC | PRN

## 2022-05-26 MED ORDER — SODIUM CHLORIDE 0.9 % IV SOLN: INTRAVENOUS | Status: DC | PRN

## 2022-05-26 MED ORDER — STERILE WATER FOR IRRIGATION IR SOLN
Status: DC | PRN
  Administered 2022-05-26: 50 mL

## 2022-05-26 MED ORDER — LIDOCAINE HCL (CARDIAC) PF 100 MG/5ML IV SOSY
PREFILLED_SYRINGE | INTRAVENOUS | Status: DC | PRN
  Administered 2022-05-26: 50 mg via INTRAVENOUS

## 2022-05-26 MED ORDER — PROPOFOL 10 MG/ML IV BOLUS
INTRAVENOUS | Status: DC | PRN
  Administered 2022-05-26: 30 mg via INTRAVENOUS
  Administered 2022-05-26: 60 mg via INTRAVENOUS

## 2022-05-26 NOTE — Anesthesia Preprocedure Evaluation (Signed)
Anesthesia Evaluation  Patient identified by MRN, date of birth, ID band Patient awake    Reviewed: Allergy & Precautions, H&P , NPO status , Patient's Chart, lab work & pertinent test results, reviewed documented beta blocker date and time   Airway Mallampati: II  TM Distance: >3 FB Neck ROM: full    Dental no notable dental hx.    Pulmonary neg pulmonary ROS, pneumonia, former smoker   Pulmonary exam normal breath sounds clear to auscultation       Cardiovascular Exercise Tolerance: Good hypertension, negative cardio ROS  Rhythm:regular Rate:Normal     Neuro/Psych negative neurological ROS  negative psych ROS   GI/Hepatic negative GI ROS, Neg liver ROS,,,  Endo/Other  negative endocrine ROSdiabetes, Poorly Controlled, Type 2    Renal/GU CRF and ESRFRenal diseasenegative Renal ROS  negative genitourinary   Musculoskeletal   Abdominal   Peds  Hematology negative hematology ROS (+) Blood dyscrasia, anemia   Anesthesia Other Findings   Reproductive/Obstetrics negative OB ROS                             Anesthesia Physical Anesthesia Plan  ASA: 3  Anesthesia Plan: General   Post-op Pain Management:    Induction:   PONV Risk Score and Plan: Propofol infusion  Airway Management Planned:   Additional Equipment:   Intra-op Plan:   Post-operative Plan:   Informed Consent: I have reviewed the patients History and Physical, chart, labs and discussed the procedure including the risks, benefits and alternatives for the proposed anesthesia with the patient or authorized representative who has indicated his/her understanding and acceptance.     Dental Advisory Given  Plan Discussed with: CRNA  Anesthesia Plan Comments:        Anesthesia Quick Evaluation

## 2022-05-26 NOTE — Op Note (Signed)
[facility] Patient Name: Devin [name] Procedure Date: 05/26/2022 11:44 AM MRN: [id number] Date of Birth: [birth year] Attending MD: [name] , , [id number] CSN: [id number] Age: 77 Admit Type: Outpatient Procedure:                Upper GI endoscopy Indications:              Iron deficiency anemia Providers:                [name], Charlsie Quest. Theda Sers RN, RN, Raphael Gibney, Technician Referring MD:              Medicines:                Monitored Anesthesia Care Complications:            No immediate complications. Estimated Blood Loss:     Estimated blood loss: none. Procedure:                Pre-Anesthesia Assessment:                           - Prior to the procedure, a History and Physical                            was performed, and patient medications, allergies                            and sensitivities were reviewed. The patient's                            tolerance of previous anesthesia was reviewed.                           - The risks and benefits of the procedure and the                            sedation options and risks were discussed with the                            patient. All questions were answered and informed                            consent was obtained.                           - ASA Grade Assessment: III - A patient with severe                            systemic disease.                           After obtaining informed consent, the endoscope was                            passed under direct vision. Throughout the  procedure, the patient's blood pressure, pulse, and                            oxygen saturations were monitored continuously. The                            [id number] scope was introduced through the                            mouth, and advanced to the second part of duodenum.                            The upper GI endoscopy was accomplished without                             difficulty. The patient tolerated the procedure                            well. Scope In: 11:57:07 AM Scope Out: 12:19:51 PM Total Procedure Duration: 0 hours 22 minutes 44 seconds  Findings:      The Z-line was irregular and was found 40 cm from the incisors.      A 2 cm hiatal hernia was present.      A few 3 to 6 mm sessile polyps with no bleeding and stigmata of recent       bleeding were found in the gastric body. Three polyps with an       ifnlammatory appearance were removed with a cold snare. Resection and       retrieval were complete. To prevent bleeding post-intervention, three       hemostatic clips were successfully placed. Clip manufacturer: [employer]. There was no bleeding at the end of the procedure.      The rest entire examined stomach was normal. Biopsies were taken with a       cold forceps for Helicobacter pylori testing.      The examined duodenum was normal. Impression:               - Z-line irregular, 40 cm from the incisors.                           - 2 cm hiatal hernia.                           - A few gastric polyps. Resected and retrieved.                            Clips were placed. Clip manufacturer: [employer].                           - Normal stomach. Biopsied.                           - Normal examined duodenum.  Moderate Sedation:      Per Anesthesia Care Recommendation:           - Discharge patient to home (ambulatory).                           - Resume previous diet.                           - Await pathology results.                           - Use Protonix (pantoprazole) 40 mg PO BID for 2                            months, then decrease to once a day. Procedure Code(s):        --- Professional ---                           [phone], Esophagogastroduodenoscopy, flexible,                            transoral; with removal of tumor(s), polyp(s), or                             other lesion(s) by snare technique                           43239, 48, Esophagogastroduodenoscopy, flexible,                            transoral; with biopsy, single or multiple Diagnosis Code(s):        --- Professional ---                           K22.89, Other specified disease of esophagus                           K44.9, Diaphragmatic hernia without obstruction or                            gangrene                           K31.7, Polyp of stomach and duodenum                           D50.9, Iron deficiency anemia, unspecified CPT copyright 2022 American Medical Association. All rights reserved. The codes documented in this report are preliminary and upon coder review may  be revised to meet current compliance requirements. [name], MD [name],  05/26/2022 12:58:06 PM This report has been signed electronically. Number of Addenda: 0

## 2022-05-26 NOTE — H&P (Signed)
Devin [name] is an 77 y.o. male.   Chief Complaint: iron def anemia HPI: Devin [name] is a 77 y.o. male with past medical history of CKD, diabetes, hypertension, gout and diastolic heart failure, who presents for evaluation of iron deficiency anemia.   The patient denies having any nausea, vomiting, fever, chills, hematochezia, melena, hematemesis, abdominal distention, abdominal pain, diarrhea, jaundice, pruritus or weight loss.   Past Medical History:  Diagnosis Date   Arthritis    CKD (chronic kidney disease) stage 4, GFR 15-29 ml/min (HCC)    Diabetes mellitus without complication ([place])    Gout    Hypertension     Past Surgical History:  Procedure Laterality Date   AV FISTULA PLACEMENT Left 12/16/2021   Procedure: LEFT ARM ARTERIOVENOUS (AV) FISTULA CREATION;  Surgeon: Rosetta Posner, MD;  Location: [facility];  Service: Vascular;  Laterality: Left;   KNEE SURGERY     TONSILLECTOMY      Family History  Family history unknown: Yes   Social History:  reports that he quit smoking about 31 years ago. His smoking use included cigarettes. He has been exposed to tobacco smoke. He has never used smokeless tobacco. He reports that he does not drink alcohol and does not use drugs.  Allergies: No Known Allergies  Medications Prior to Admission  Medication Sig Dispense Refill   aspirin EC 81 MG tablet Take 81 mg by mouth daily. Swallow whole.     carvedilol (COREG) 12.5 MG tablet Take 12.5 mg by mouth 2 (two) times daily with a meal.     cholecalciferol (VITAMIN D3) 25 MCG (1000 UNIT) tablet Take 1,000 Units by mouth daily.     cloNIDine (CATAPRES) 0.3 MG tablet Take 0.3 mg by mouth in the morning, at noon, and at bedtime.     ferrous sulfate 325 (65 FE) MG tablet Take 1 tablet (325 mg total) by mouth 2 (two) times daily with a meal. (Patient taking differently: Take 325 mg by mouth daily.) 180 tablet 3   hydrALAZINE (APRESOLINE) 100 MG tablet Take 100 mg by mouth in the morning, at noon,  and at bedtime.     simvastatin (ZOCOR) 20 MG tablet Take 20 mg by mouth daily.     Cyanocobalamin 1000 MCG SUBL Place 1,000 mcg under the tongue daily.     epoetin alfa (EPOGEN) 3000 UNIT/ML injection Inject into the skin.      Results for orders placed or performed during the hospital encounter of 05/26/22 (from the past 48 hour(s))  Glucose, capillary     Status: Abnormal   Collection Time: 05/26/22 11:10 AM  Result Value Ref Range   Glucose-Capillary 126 (H) 70 - 99 mg/dL    Comment: Glucose reference range applies only to samples taken after fasting for at least 8 hours.   No results found.  Review of Systems  All other systems reviewed and are negative.   Blood pressure (!) 169/67, pulse 79, temperature 97.6 F (36.4 C), resp. rate 19, height '5\' 9"'$  (1.753 m), weight 93.9 kg. Physical Exam  GENERAL: The patient is AO x3, in no acute distress. HEENT: Head is normocephalic and atraumatic. EOMI are intact. Mouth is well hydrated and without lesions. NECK: Supple. No masses LUNGS: Clear to auscultation. No presence of rhonchi/wheezing/rales. Adequate chest expansion HEART: RRR, normal s1 and s2. ABDOMEN: Soft, nontender, no guarding, no peritoneal signs, and nondistended. BS +. No masses. EXTREMITIES: Without any cyanosis, clubbing, rash, lesions or edema. NEUROLOGIC: AOx3, no focal motor  deficit. SKIN: no jaundice, no rashes  Assessment/Plan  Devin [name] is a 77 y.o. male with past medical history of CKD, diabetes, hypertension, gout and diastolic heart failure, who presents for evaluation of iron deficiency anemia. We will proceed with EGD and colonoscopy  [name], MD 05/26/2022, 11:19 AM

## 2022-05-26 NOTE — Discharge Instructions (Addendum)
You are being discharged to home.  Resume your previous diet.  We are waiting for your pathology results.  Take Protonix (pantoprazole) 40 mg by mouth twice a day for two months, then decrease to once a day.  We are waiting for your pathology results.  Your physician has indicated that a repeat colonoscopy is not recommended due to your current age (33 years or older) for screening purposes.  Continue oral iron

## 2022-05-26 NOTE — Op Note (Addendum)
[facility] Patient Name: Devin [name] Procedure Date: 05/26/2022 11:46 AM MRN: [id number] Date of Birth: Feb 24, 1946 Attending MD: [name] , , [id number] CSN: [id number] Age: 77 Admit Type: Outpatient Procedure:                Colonoscopy Indications:              Iron deficiency anemia Providers:                [name], Charlsie Quest. Theda Sers RN, RN, Raphael Gibney, Technician Referring MD:              Medicines:                Monitored Anesthesia Care Complications:            No immediate complications. Estimated Blood Loss:     Estimated blood loss: none. Procedure:                Pre-Anesthesia Assessment:                           - Prior to the procedure, a History and Physical                            was performed, and patient medications, allergies                            and sensitivities were reviewed. The patient's                            tolerance of previous anesthesia was reviewed.                           - The risks and benefits of the procedure and the                            sedation options and risks were discussed with the                            patient. All questions were answered and informed                            consent was obtained.                           - ASA Grade Assessment: III - A patient with severe                            systemic disease.                           After obtaining informed consent, the colonoscope                            was passed under direct vision. Throughout the  procedure, the patient's blood pressure, pulse, and                            oxygen saturations were monitored continuously. The                            [id number] scope was introduced through                            the anus and advanced to the the cecum, identified                            by appendiceal orifice and ileocecal valve. The                             colonoscopy was performed without difficulty. The                            patient tolerated the procedure well. The quality                            of the bowel preparation was good. Scope In: 12:26:55 PM Scope Out: 12:54:52 PM Scope Withdrawal Time: 0 hours 20 minutes 46 seconds  Total Procedure Duration: 0 hours 27 minutes 57 seconds  Findings:      The perianal and digital rectal examinations were normal.      A 12 to 14 mm polyp was found in the ileocecal valve. The polyp was       carpet-like. Area was successfully injected with 4 mL Eleview for a lift       polypectomy, there was partial lifting with this. Imaging was performed       using white light and narrow band imaging to visualize the mucosa and       demarcate the polyp site after injection for EMR purposes. The polyp was       removed with a hot snare. Resection and retrieval were complete. To       prevent bleeding after the polypectomy, two hemostatic clips were       successfully placed. Clip manufacturer: [employer]. There was no       bleeding at the end of the procedure.      A few small-mouthed diverticula were found in the descending colon.      A 2 mm polyp was found in the rectum. The polyp was sessile. The polyp       was removed with a cold biopsy forceps. Resection and retrieval were       complete.      Non-bleeding internal hemorrhoids were found during retroflexion. The       hemorrhoids were small. Impression:               - One 12 to 14 mm polyp at the ileocecal valve,                            removed with a hot snare. Removed via EMR. Clips  were placed. Clip manufacturer: [employer].                           - Diverticulosis in the descending colon.                           - One 2 mm polyp in the rectum, removed with a cold                            biopsy forceps. Resected and retrieved.                           - Non-bleeding  internal hemorrhoids. Moderate Sedation:      Per Anesthesia Care Recommendation:           - Discharge patient to home (ambulatory).                           - Resume previous diet.                           - Await pathology results.                           - Repeat colonoscopy is not recommended due to                            current age (4 years or older) for screening                            purposes.                           - Continue oral iron Procedure Code(s):        --- Professional ---                           [phone], 59, Colonoscopy, flexible; with removal of                            tumor(s), polyp(s), or other lesion(s) by snare                            technique                           45380, 59, Colonoscopy, flexible; with biopsy,                            single or multiple                           45381, Colonoscopy, flexible; with directed                            submucosal injection(s), any substance Diagnosis Code(s):        --- Professional ---  D12.0, Benign neoplasm of cecum                           D12.8, Benign neoplasm of rectum                           K64.8, Other hemorrhoids                           D50.9, Iron deficiency anemia, unspecified                           K57.30, Diverticulosis of large intestine without                            perforation or abscess without bleeding CPT copyright 2022 American Medical Association. All rights reserved. The codes documented in this report are preliminary and upon coder review may  be revised to meet current compliance requirements. [name], MD [name],  05/26/2022 1:09:05 PM This report has been signed electronically. Number of Addenda: 0

## 2022-05-26 NOTE — Transfer of Care (Signed)
Immediate Anesthesia Transfer of Care Note  Patient: Devin [name]  Procedure(s) Performed: COLONOSCOPY WITH PROPOFOL ESOPHAGOGASTRODUODENOSCOPY (EGD) WITH PROPOFOL POLYPECTOMY BIOPSY HEMOSTASIS CLIP PLACEMENT  Patient Location: PACU  Anesthesia Type:MAC  Level of Consciousness: awake, alert , and oriented  Airway & Oxygen Therapy: Patient Spontanous Breathing  Post-op Assessment: Report given to RN and Post -op Vital signs reviewed and stable  Post vital signs: Reviewed and stable  Last Vitals:  Vitals Value Taken Time  BP 112/53 05/26/22 1259  Temp    Pulse 51 05/26/22 1259  Resp 16 05/26/22 1259  SpO2 93 % 05/26/22 1259    Last Pain:  Vitals:   05/26/22 1259  TempSrc: Oral  PainSc: Asleep      Patients Stated Pain Goal: 4 ([id number])  Complications: No notable events documented.

## 2022-05-27 DIAGNOSIS — D631 Anemia in chronic kidney disease: Secondary | ICD-10-CM | POA: Diagnosis not present

## 2022-05-27 DIAGNOSIS — I129 Hypertensive chronic kidney disease with stage 1 through stage 4 chronic kidney disease, or unspecified chronic kidney disease: Secondary | ICD-10-CM | POA: Diagnosis not present

## 2022-05-27 DIAGNOSIS — E1129 Type 2 diabetes mellitus with other diabetic kidney complication: Secondary | ICD-10-CM | POA: Diagnosis not present

## 2022-05-27 DIAGNOSIS — D508 Other iron deficiency anemias: Secondary | ICD-10-CM | POA: Diagnosis not present

## 2022-05-27 DIAGNOSIS — I5032 Chronic diastolic (congestive) heart failure: Secondary | ICD-10-CM | POA: Diagnosis not present

## 2022-05-27 DIAGNOSIS — E1122 Type 2 diabetes mellitus with diabetic chronic kidney disease: Secondary | ICD-10-CM | POA: Diagnosis not present

## 2022-05-27 DIAGNOSIS — R809 Proteinuria, unspecified: Secondary | ICD-10-CM | POA: Diagnosis not present

## 2022-05-27 DIAGNOSIS — E211 Secondary hyperparathyroidism, not elsewhere classified: Secondary | ICD-10-CM | POA: Diagnosis not present

## 2022-05-27 DIAGNOSIS — N189 Chronic kidney disease, unspecified: Secondary | ICD-10-CM | POA: Diagnosis not present

## 2022-05-27 DIAGNOSIS — N185 Chronic kidney disease, stage 5: Secondary | ICD-10-CM | POA: Diagnosis not present

## 2022-05-27 NOTE — Anesthesia Postprocedure Evaluation (Signed)
Anesthesia Post Note  Patient: Devin [name]  Procedure(s) Performed: COLONOSCOPY WITH PROPOFOL ESOPHAGOGASTRODUODENOSCOPY (EGD) WITH PROPOFOL POLYPECTOMY BIOPSY HEMOSTASIS CLIP PLACEMENT  Patient location during evaluation: Phase II Anesthesia Type: General Level of consciousness: awake Pain management: pain level controlled Vital Signs Assessment: post-procedure vital signs reviewed and stable Respiratory status: spontaneous breathing and respiratory function stable Cardiovascular status: blood pressure returned to baseline and stable Postop Assessment: no headache and no apparent nausea or vomiting Anesthetic complications: no Comments: Late entry   No notable events documented.   Last Vitals:  Vitals:   05/26/22 1108 05/26/22 1259  BP: (!) 169/67 (!) 112/53  Pulse: 79 (!) 51  Resp: 19 16  Temp: 36.4 C   SpO2:  93%    Last Pain:  Vitals:   05/26/22 1259  TempSrc: Oral  PainSc: [place]

## 2022-05-28 LAB — SURGICAL PATHOLOGY

## 2022-06-05 ENCOUNTER — Encounter (HOSPITAL_COMMUNITY): Payer: Self-pay | Admitting: Gastroenterology

## 2022-06-16 ENCOUNTER — Encounter: Payer: Self-pay | Admitting: Vascular Surgery

## 2022-06-16 DIAGNOSIS — M109 Gout, unspecified: Secondary | ICD-10-CM | POA: Insufficient documentation

## 2022-06-16 DIAGNOSIS — N184 Chronic kidney disease, stage 4 (severe): Secondary | ICD-10-CM | POA: Insufficient documentation

## 2022-06-16 DIAGNOSIS — E119 Type 2 diabetes mellitus without complications: Secondary | ICD-10-CM | POA: Insufficient documentation

## 2022-06-17 ENCOUNTER — Encounter: Payer: Self-pay | Admitting: Vascular Surgery

## 2022-06-17 ENCOUNTER — Ambulatory Visit: Payer: Medicare Other | Admitting: Vascular Surgery

## 2022-06-17 VITALS — BP 146/72 | HR 58 | Temp 98.1°F | Ht 69.0 in | Wt 201.0 lb

## 2022-06-17 DIAGNOSIS — N184 Chronic kidney disease, stage 4 (severe): Secondary | ICD-10-CM | POA: Diagnosis not present

## 2022-06-17 DIAGNOSIS — E119 Type 2 diabetes mellitus without complications: Secondary | ICD-10-CM

## 2022-06-17 DIAGNOSIS — M109 Gout, unspecified: Secondary | ICD-10-CM | POA: Diagnosis not present

## 2022-06-17 NOTE — Progress Notes (Signed)
[facility] [place]  Patient name: Devin [name] MRN: [id number] DOB: 02/19/1946 Sex: male  REASON FOR VISIT: Follow-up left brachiocephalic AV fistula  HPI: Devin [name] is a 77 y.o. male here today for follow-up of his left brachiocephalic AV fistula.  He has progressive renal insufficiency but is not on dialysis currently.  He underwent primary brachiocephalic fistula creation by myself on 12/16/2021.  He has no steal symptoms.  Past Medical History:  Diagnosis Date   Arthritis    CKD (chronic kidney disease) stage 4, GFR 15-29 ml/min (HCC)    Diabetes mellitus without complication ([place])    Gout    Hypertension     Family History  Problem Relation Age of Onset   Cancer Father    Stroke Sister    Kidney disease Sister    Heart disease Brother     SOCIAL HISTORY: Social History   Tobacco Use   Smoking status: Former    Years: 22    Types: Cigarettes    Quit date: 1993    Years since quitting: 31.2    Passive exposure: Past   Smokeless tobacco: Never  Substance Use Topics   Alcohol use: Never    No Known Allergies  Current Outpatient Medications  Medication Sig Dispense Refill   aspirin EC 81 MG tablet Take 81 mg by mouth daily. Swallow whole.     carvedilol (COREG) 12.5 MG tablet Take 12.5 mg by mouth 2 (two) times daily with a meal.     cholecalciferol (VITAMIN D3) 25 MCG (1000 UNIT) tablet Take 1,000 Units by mouth daily.     cloNIDine (CATAPRES) 0.3 MG tablet Take 0.3 mg by mouth in the morning, at noon, and at bedtime.     Cyanocobalamin 1000 MCG SUBL Place 1,000 mcg under the tongue daily.     epoetin alfa (EPOGEN) 3000 UNIT/ML injection Inject into the skin.     ferrous sulfate 325 (65 FE) MG tablet Take 1 tablet (325 mg total) by mouth 2 (two) times daily with a meal. (Patient taking differently: Take 325 mg by mouth daily.) 180 tablet 3   hydrALAZINE (APRESOLINE) 100 MG tablet Take 100 mg by  mouth in the morning, at noon, and at bedtime.     pantoprazole (PROTONIX) 40 MG tablet Take 1 tablet (40 mg total) by mouth 2 (two) times daily for 60 days, THEN 1 tablet (40 mg total) daily. 390 tablet 0   simvastatin (ZOCOR) 20 MG tablet Take 20 mg by mouth daily.     No current facility-administered medications for this visit.    REVIEW OF SYSTEMS:  [X]  denotes positive finding, [ ]  denotes negative finding Cardiac  Comments:  Chest pain or chest pressure:    Shortness of breath upon exertion:    Short of breath when lying flat:    Irregular heart rhythm:        Vascular    Pain in calf, thigh, or hip brought on by ambulation:    Pain in feet at night that wakes you up from your sleep:     Blood clot in your veins:    Leg swelling:           PHYSICAL EXAM: Vitals:   06/17/22 1305  BP: (!) 146/72  Pulse: (!) 58  Temp: 98.1 F (36.7 C)  SpO2: 96%  Weight: 201 lb (91.2 kg)  Height: 5\' 9"  (1.753 m)    GENERAL: The patient is a well-nourished male, in  no acute distress. The vital signs are documented above. CARDIOVASCULAR: Good thrill in his left upper arm AV fistula.  His antecubital incision is well-healed.  The vein does run superficial and runs in a straight course.  The size of the fistula is somewhat marginal.  Approximately 5 to 6 mm by physical exam PULMONARY: There is good air exchange  MUSCULOSKELETAL: There are no major deformities or cyanosis. NEUROLOGIC: No focal weakness or paresthesias are detected. SKIN: There are no ulcers or rashes noted. PSYCHIATRIC: The patient has a normal affect.  DATA:  I imaged his veins with SonoSite ultrasound.  He does not have any evidence of large competing branches.  Does have moderate dilatation of the fistula  MEDICAL ISSUES: 84-month status post left brachiocephalic fistula creation.  Discussed with the patient that he does have a very superficial location in the vein does run in his straight course in his upper arm.  I am  concerned about the moderate size of his fistula.  I do not see any technical options to further improve his size maturation.  I do feel that his vein size is adequate for attempting to access.  He understands that if this is not reliable, he will require an alternative in the future.    Rosetta Posner, MD FACS [facility] [phone]  Note: Portions of this report may have been transcribed using voice recognition software.  Every effort has been made to ensure accuracy; however, inadvertent computerized transcription errors may still be present.

## 2022-06-21 DIAGNOSIS — I10 Essential (primary) hypertension: Secondary | ICD-10-CM | POA: Diagnosis not present

## 2022-06-21 DIAGNOSIS — E782 Mixed hyperlipidemia: Secondary | ICD-10-CM | POA: Diagnosis not present

## 2022-06-21 DIAGNOSIS — N184 Chronic kidney disease, stage 4 (severe): Secondary | ICD-10-CM | POA: Diagnosis not present

## 2022-07-02 DIAGNOSIS — D649 Anemia, unspecified: Secondary | ICD-10-CM | POA: Diagnosis not present

## 2022-07-02 DIAGNOSIS — I5032 Chronic diastolic (congestive) heart failure: Secondary | ICD-10-CM | POA: Diagnosis not present

## 2022-07-02 DIAGNOSIS — E782 Mixed hyperlipidemia: Secondary | ICD-10-CM | POA: Diagnosis not present

## 2022-07-02 DIAGNOSIS — D529 Folate deficiency anemia, unspecified: Secondary | ICD-10-CM | POA: Diagnosis not present

## 2022-07-02 DIAGNOSIS — I129 Hypertensive chronic kidney disease with stage 1 through stage 4 chronic kidney disease, or unspecified chronic kidney disease: Secondary | ICD-10-CM | POA: Diagnosis not present

## 2022-07-02 DIAGNOSIS — N189 Chronic kidney disease, unspecified: Secondary | ICD-10-CM | POA: Diagnosis not present

## 2022-07-02 DIAGNOSIS — E7849 Other hyperlipidemia: Secondary | ICD-10-CM | POA: Diagnosis not present

## 2022-07-02 DIAGNOSIS — Z1321 Encounter for screening for nutritional disorder: Secondary | ICD-10-CM | POA: Diagnosis not present

## 2022-07-07 IMAGING — US US RENAL
1 series · 14 of 25 positions shown · non-contrast
Comparison: June 30, 2021

CLINICAL DATA: Chronic renal insufficiency.

EXAM:
RENAL / URINARY TRACT ULTRASOUND COMPLETE

[Series 1: us renal · 14 of 48 slices shown]
[im 1/48]
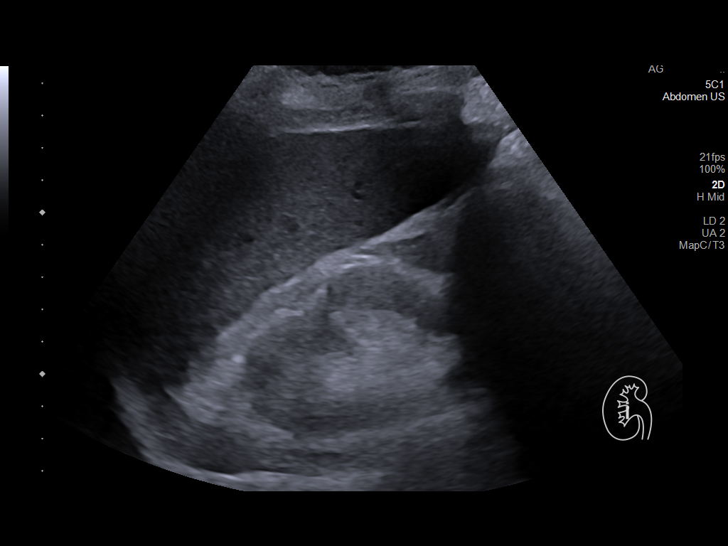
[im 4/48]
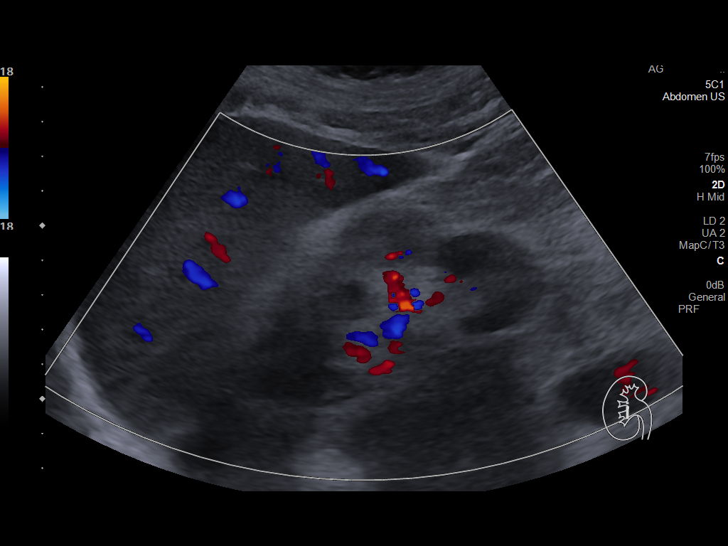
[im 8/48]
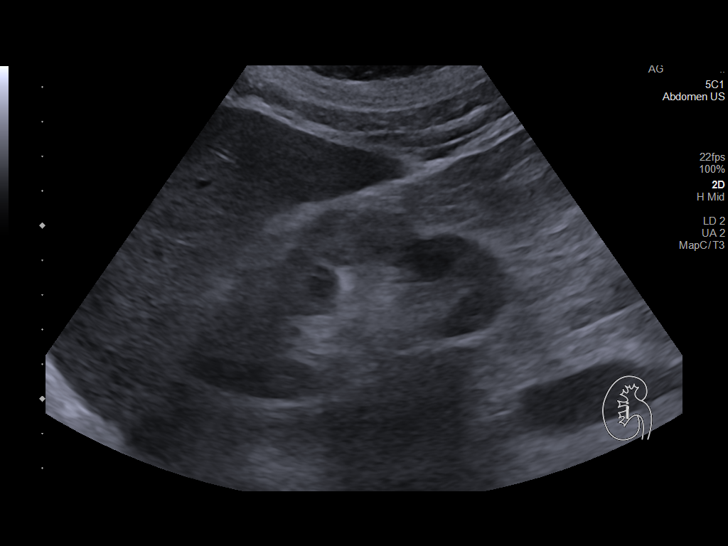
[im 12/48]
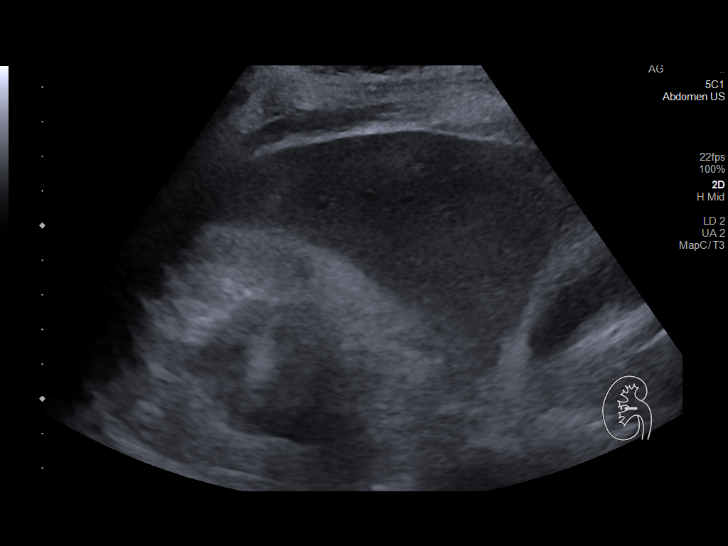
[im 16/48]
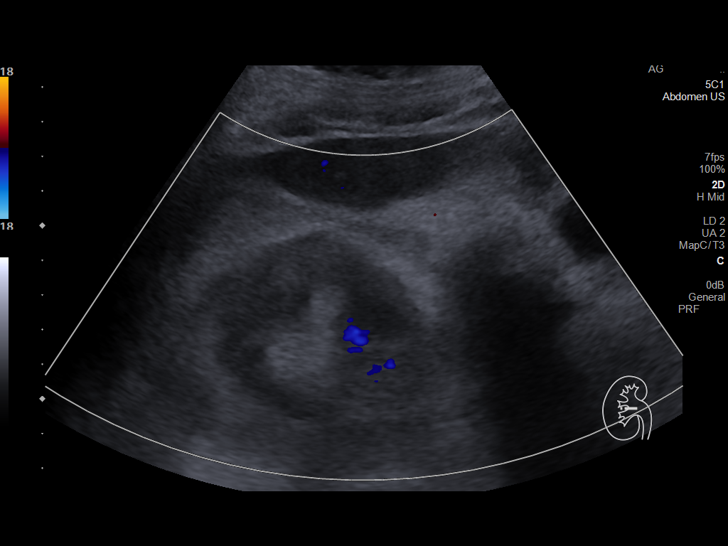
[im 18/48]
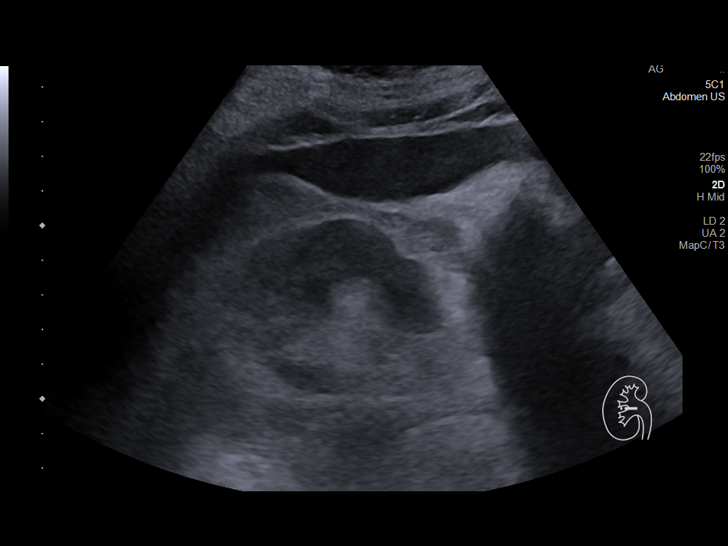
[im 22/48]
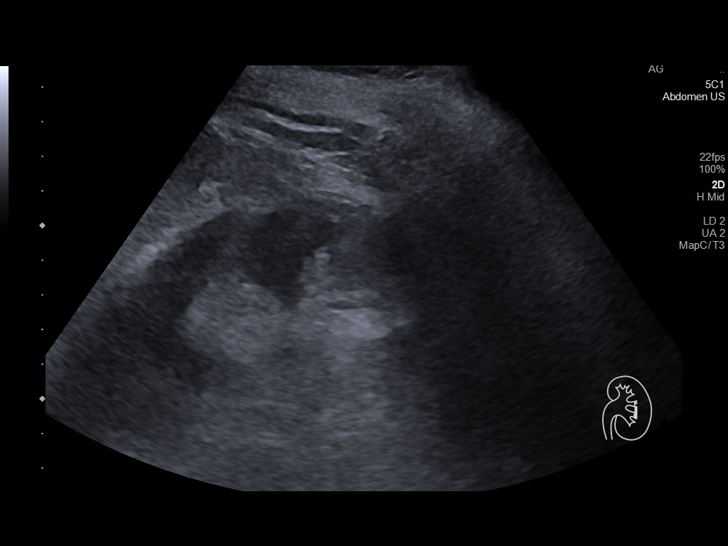
[im 26/48]
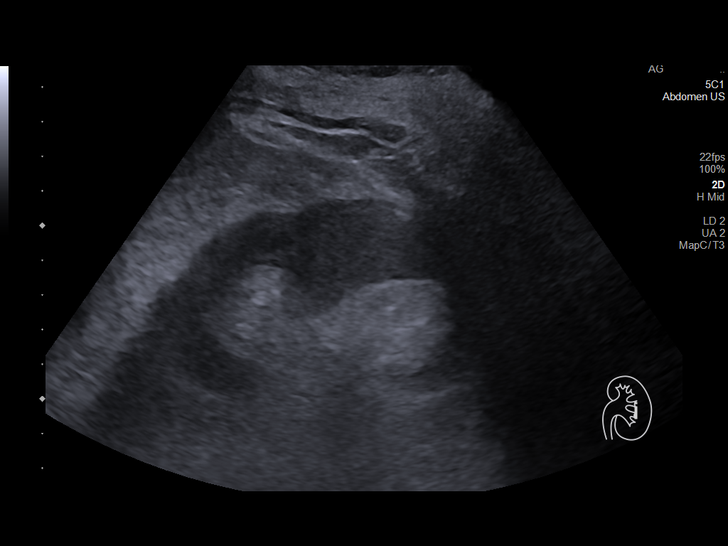
[im 30/48]
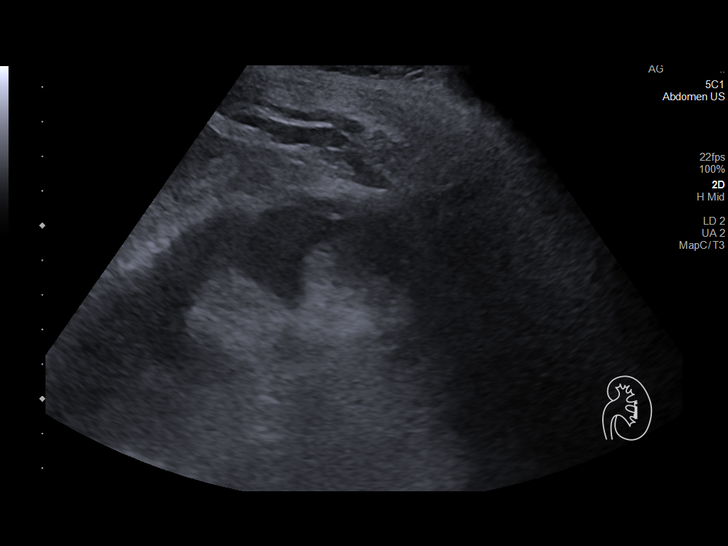
[im 32/48]
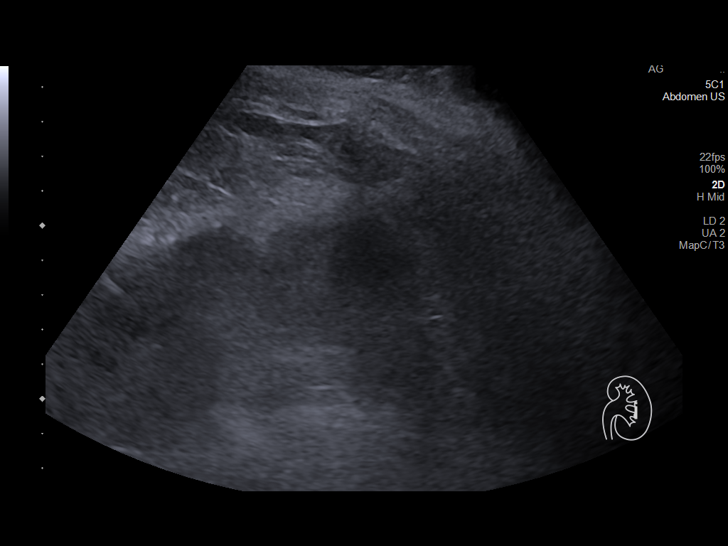
[im 36/48]
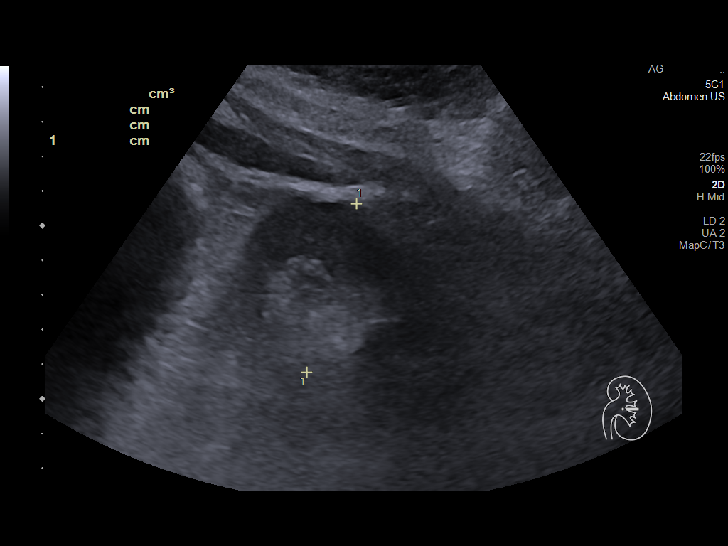
[im 40/48]
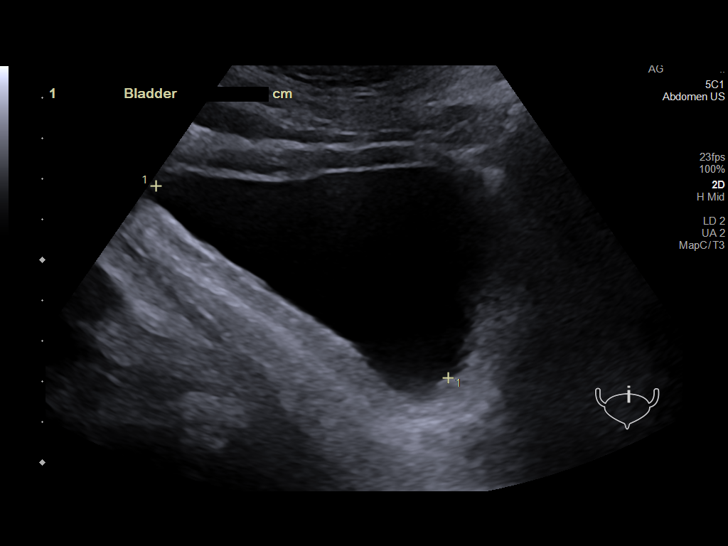
[im 44/48]
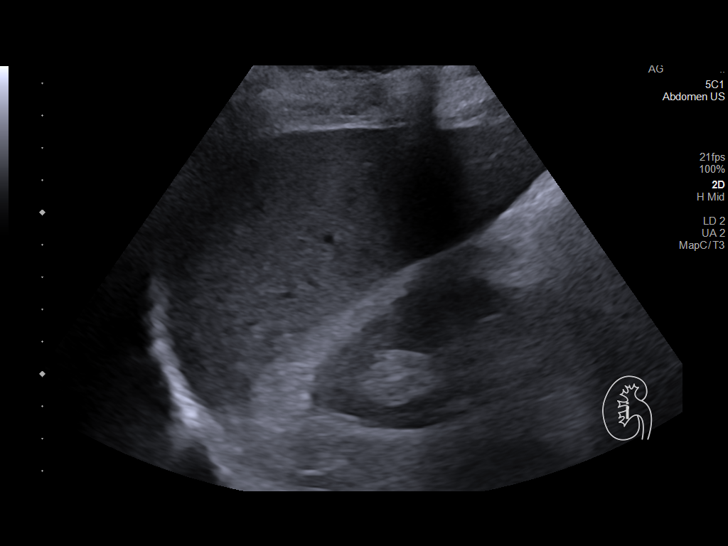
[im 48/48]
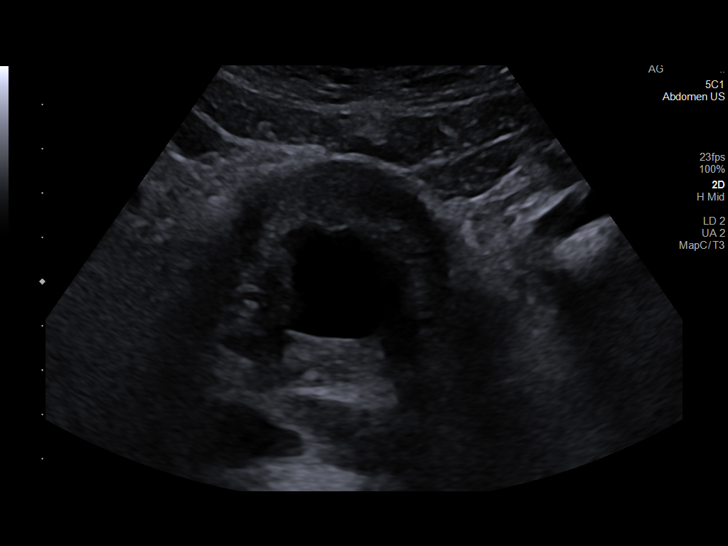

[14 of 25 positions shown; findings below may reference images not displayed]

FINDINGS: Right Kidney:

Renal measurements: 10.0 x 5.1 x 5.4 cm = volume: 142.3 mL.
Increased cortical echogenicity. No focal mass. No hydronephrosis.

Left Kidney:

Renal measurements: 9.9 x 5.6 x 5.1 cm = volume: 148 mL. Increased
cortical echogenicity. No focal mass. No hydronephrosis.

Bladder:

The bladder demonstrates a mildly irregular wall that is stable. No
bladder mass identified.

Other:

None.
IMPRESSION: 1. Increased echogenicity in both kidneys consistent with medical
renal disease.
2. The bladder wall remains mildly irregular but stable. No bladder
mass identified.

## 2022-07-10 DIAGNOSIS — N185 Chronic kidney disease, stage 5: Secondary | ICD-10-CM | POA: Diagnosis not present

## 2022-07-10 DIAGNOSIS — N189 Chronic kidney disease, unspecified: Secondary | ICD-10-CM | POA: Diagnosis not present

## 2022-07-10 DIAGNOSIS — E211 Secondary hyperparathyroidism, not elsewhere classified: Secondary | ICD-10-CM | POA: Diagnosis not present

## 2022-07-10 DIAGNOSIS — D631 Anemia in chronic kidney disease: Secondary | ICD-10-CM | POA: Diagnosis not present

## 2022-07-10 DIAGNOSIS — I129 Hypertensive chronic kidney disease with stage 1 through stage 4 chronic kidney disease, or unspecified chronic kidney disease: Secondary | ICD-10-CM | POA: Diagnosis not present

## 2022-07-10 DIAGNOSIS — E1122 Type 2 diabetes mellitus with diabetic chronic kidney disease: Secondary | ICD-10-CM | POA: Diagnosis not present

## 2022-07-13 ENCOUNTER — Other Ambulatory Visit: Payer: Self-pay | Admitting: *Deleted

## 2022-07-13 ENCOUNTER — Other Ambulatory Visit: Payer: Self-pay | Admitting: Physician Assistant

## 2022-07-13 DIAGNOSIS — D509 Iron deficiency anemia, unspecified: Secondary | ICD-10-CM

## 2022-07-13 DIAGNOSIS — D649 Anemia, unspecified: Secondary | ICD-10-CM

## 2022-07-13 NOTE — Progress Notes (Signed)
Contacted by nephrologist (Dr. Wolfgang Phoenix) due to worsening anemia.  Labs from 07/02/2022 showed Hgb 8.9/MCV 90.0.  Epogen injections had been ordered by Dr. Wolfgang Phoenix in October 2023, but patient never received this.  We will check labs this week to include CBC/D, BB sample, ferritin, iron/TIBC, light chains, immunofixation.  We will take over ESA dosing.  Orders entered for Retacrit 10,000 units every 2 weeks.  We will tentatively proceed with first injection on same day as office visit next week, pending lab results and discussion with patient.

## 2022-07-16 ENCOUNTER — Inpatient Hospital Stay: Payer: Medicare Other | Attending: Hematology

## 2022-07-16 DIAGNOSIS — D631 Anemia in chronic kidney disease: Secondary | ICD-10-CM | POA: Insufficient documentation

## 2022-07-16 DIAGNOSIS — N184 Chronic kidney disease, stage 4 (severe): Secondary | ICD-10-CM | POA: Diagnosis not present

## 2022-07-16 DIAGNOSIS — D509 Iron deficiency anemia, unspecified: Secondary | ICD-10-CM

## 2022-07-16 DIAGNOSIS — D649 Anemia, unspecified: Secondary | ICD-10-CM

## 2022-07-16 DIAGNOSIS — E538 Deficiency of other specified B group vitamins: Secondary | ICD-10-CM | POA: Diagnosis not present

## 2022-07-16 LAB — CBC
HCT: 29 % — ABNORMAL LOW (ref 39.0–52.0)
Hemoglobin: 9.1 g/dL — ABNORMAL LOW (ref 13.0–17.0)
MCH: 30.2 pg (ref 26.0–34.0)
MCHC: 31.4 g/dL (ref 30.0–36.0)
MCV: 96.3 fL (ref 80.0–100.0)
Platelets: 301 10*3/uL (ref 150–400)
RBC: 3.01 MIL/uL — ABNORMAL LOW (ref 4.22–5.81)
RDW: 15 % (ref 11.5–15.5)
WBC: 10.2 10*3/uL (ref 4.0–10.5)
nRBC: 0 % (ref 0.0–0.2)

## 2022-07-16 LAB — FERRITIN: Ferritin: 392 ng/mL — ABNORMAL HIGH (ref 24–336)

## 2022-07-16 LAB — IRON AND TIBC
Iron: 73 ug/dL (ref 45–182)
Saturation Ratios: 26 % (ref 17.9–39.5)
TIBC: 278 ug/dL (ref 250–450)
UIBC: 205 ug/dL

## 2022-07-16 LAB — SAMPLE TO BLOOD BANK

## 2022-07-16 LAB — ABO/RH: ABO/RH(D): O POS

## 2022-07-17 ENCOUNTER — Ambulatory Visit: Payer: Medicare Other

## 2022-07-17 ENCOUNTER — Ambulatory Visit: Payer: Medicare Other | Admitting: Physician Assistant

## 2022-07-17 LAB — KAPPA/LAMBDA LIGHT CHAINS
Kappa free light chain: 216.5 mg/L — ABNORMAL HIGH (ref 3.3–19.4)
Kappa, lambda light chain ratio: 2.73 — ABNORMAL HIGH (ref 0.26–1.65)
Lambda free light chains: 79.2 mg/L — ABNORMAL HIGH (ref 5.7–26.3)

## 2022-07-22 ENCOUNTER — Ambulatory Visit: Payer: Medicare Other | Admitting: Physician Assistant

## 2022-07-22 ENCOUNTER — Ambulatory Visit: Payer: Medicare Other

## 2022-07-22 NOTE — Progress Notes (Unsigned)
[name] [address], [place] [address]   CLINIC:  Medical Oncology/Hematology  PCP:  Richardean Chimera, MD [address] [place] [address] [phone]   REASON FOR VISIT:  Follow-up for normocytic anemia  CURRENT THERAPY: IV iron and B12  INTERVAL HISTORY:   Devin [name] 77 y.o. male returns for routine follow-up of normocytic anemia.  He was last seen by Dr. Ellin Saba on 05/19/2022.  Since his last visit, he had EGD and colonoscopy on 05/26/2022, which showed gastric polyps, diverticulosis, internal hemorrhoids, and colon polyps.  He was noted by nephrologist to have acute drop in hemoglobin with Hgb 8.9 on 07/02/2022 (down from Hgb 11.1 on 05/12/2022).  He has not noticed any rectal bleeding, melena, epistaxis, or other sources of blood loss.  He denies any abnormal fatigue and reports that his energy is a bit higher than baseline.  He denies any abnormal headaches, chest pain, dyspnea, lightheadedness, or syncope.  He has 75% energy and 100% appetite. He endorses that he is maintaining a stable weight.  ASSESSMENT & PLAN:  1.  Normocytic anemia - Seen at the request of Dr. Wolfgang Phoenix after labs from 12/15/2021 - Patient has required intermittent IV iron (received 1 g parenteral iron at [facility] in June 2023).  Most recently received Feraheme x 1 on 03/25/2022. - Stool cards positive for blood.  EGD/colonoscopy on 05/26/2022 revealed gastric polyps with stigmata of recent bleeding, colon polyps, nonbleeding internal hemorrhoids, and diverticulosis - Labs from last visit were improved after IV iron with Hgb up to 11.1 (05/12/2022), but labs from nephrologist showed Hgb 8.9/MCV 90 (07/02/2022). - Per nephrology's note, patient was requested to follow-up with hematology/oncology for Epogen shots, but this was not done.   - Most recent labs (07/16/2022): Immunofixation shows polyclonal increase in immunoglobulins.  Light chain ratio elevated at 2.73 (kappa 216.5, lambda  79.2).  SPEP negative for M spike in December 2023. Ferritin 392, iron saturation 26% - Most recent CBC (07/16/2022): Hgb 9.1/MCV 96.3.  Normal WBC and platelets. - Denies any bleeding per rectum or melena.  Denies any ice pica. - DIFFERENTIAL DIAGNOSIS favors combination anemia from CKD, relative iron deficiency, and B12 deficiency - PLAN: Due to persistent anemia despite adequate iron levels, recommend starting patient on Retacrit 10,000 units every 2 weeks.  We discussed common side effects and risks, and patient is agreeable to proceed.  He will receive first dose today.   - Repeat CBC, CMP, ferritin, iron/TIBC with office visit in 1 month  2.  B12 deficiency: - Labs from 11/28/2021 show low vitamin B12 at 177, with MMA elevated at 562 - Patient is taking vitamin B12 1000 mcg daily - Most recent labs (05/12/2022) show improved vitamin B12 at 1231, MMA normal.   - PLAN: Continue vitamin B12 1000 mcg tablet daily.  Recheck B12/MMA in 6 to 12 months.  3.  CKD stage V - Follows with Dr. Wolfgang Phoenix - Left brachiocephalic AV fistula placed on 2/95/6213 - He has progressive renal insufficiency, but not yet on dialysis   4.  Social/family history: - Other PMH: Hypertension, diabetes, arthritis, CKD stage V - He is a retired Producer, television/film/video and is independent of ADLs and IADLs.  Quit smoking cigarettes 30 years ago. - Father had prostate cancer.   PLAN SUMMARY: >> CBC + Retacrit every 2 weeks >> Labs in 4 weeks = CBC/D, CMP, ferritin, iron/TIBC >> OFFICE visit in 1 month     REVIEW  OF SYSTEMS:   Review of Systems  Constitutional:  Negative for appetite change, chills, diaphoresis, fatigue, fever and unexpected weight change.  HENT:   Negative for lump/mass and nosebleeds.   Eyes:  Negative for eye problems.  Respiratory:  Negative for cough, hemoptysis and shortness of breath.   Cardiovascular:  Negative for chest pain, leg swelling and palpitations.  Gastrointestinal:  Negative for  abdominal pain, blood in stool, constipation, diarrhea, nausea and vomiting.  Genitourinary:  Negative for hematuria.   Skin: Negative.   Neurological:  Negative for dizziness, headaches and light-headedness.  Hematological:  Does not bruise/bleed easily.     PHYSICAL EXAM:  ECOG PERFORMANCE STATUS: 1 - Symptomatic but completely ambulatory  There were no vitals filed for this visit. There were no vitals filed for this visit. Physical Exam Constitutional:      Appearance: Normal appearance. He is normal weight.  Cardiovascular:     Heart sounds: Normal heart sounds.  Pulmonary:     Breath sounds: Normal breath sounds.  Neurological:     General: No focal deficit present.     Mental Status: Mental status is at baseline.  Psychiatric:        Behavior: Behavior normal. Behavior is cooperative.     PAST MEDICAL/SURGICAL HISTORY:  Past Medical History:  Diagnosis Date   Arthritis    CKD (chronic kidney disease) stage 4, GFR 15-29 ml/min (HCC)    Diabetes mellitus without complication (HCC)    Gout    Hypertension    Past Surgical History:  Procedure Laterality Date   AV FISTULA PLACEMENT Left 12/16/2021   Procedure: LEFT ARM ARTERIOVENOUS (AV) FISTULA CREATION;  Surgeon: Larina Earthly, MD;  Location: [facility];  Service: Vascular;  Laterality: Left;   BIOPSY  05/26/2022   Procedure: BIOPSY;  Surgeon: Dolores Frame, MD;  Location: [facility];  Service: Gastroenterology;;   COLONOSCOPY WITH PROPOFOL N/A 05/26/2022   Procedure: COLONOSCOPY WITH PROPOFOL;  Surgeon: Dolores Frame, MD;  Location: [facility];  Service: Gastroenterology;  Laterality: N/A;  200pm, asa 3   ESOPHAGOGASTRODUODENOSCOPY (EGD) WITH PROPOFOL N/A 05/26/2022   Procedure: ESOPHAGOGASTRODUODENOSCOPY (EGD) WITH PROPOFOL;  Surgeon: Dolores Frame, MD;  Location: [facility];  Service: Gastroenterology;  Laterality: N/A;   HEMOSTASIS CLIP PLACEMENT  05/26/2022   Procedure: HEMOSTASIS  CLIP PLACEMENT;  Surgeon: Dolores Frame, MD;  Location: [facility];  Service: Gastroenterology;;   KNEE SURGERY     POLYPECTOMY  05/26/2022   Procedure: POLYPECTOMY;  Surgeon: Dolores Frame, MD;  Location: [facility];  Service: Gastroenterology;;   TONSILLECTOMY      SOCIAL HISTORY:  Social History   Socioeconomic History   Marital status: Married    Spouse name: Not on file   Number of children: Not on file   Years of education: Not on file   Highest education level: Not on file  Occupational History   Not on file  Tobacco Use   Smoking status: Former    Years: 20    Types: Cigarettes    Quit date: 54    Years since quitting: 31.3    Passive exposure: Past   Smokeless tobacco: Never  Vaping Use   Vaping Use: Never used  Substance and Sexual Activity   Alcohol use: Never   Drug use: Never   Sexual activity: Yes  Other Topics Concern   Not on file  Social History Narrative   Not on file   Social  Determinants of Health   Financial Resource Strain: Not on file  Food Insecurity: Not on file  Transportation Needs: Not on file  Physical Activity: Not on file  Stress: Not on file  Social Connections: Not on file  Intimate Partner Violence: Not on file    FAMILY HISTORY:  Family History  Problem Relation Age of Onset   Cancer Father    Stroke Sister    Kidney disease Sister    Heart disease Brother     CURRENT MEDICATIONS:  Outpatient Encounter Medications as of 07/23/2022  Medication Sig   aspirin EC 81 MG tablet Take 81 mg by mouth daily. Swallow whole.   carvedilol (COREG) 12.5 MG tablet Take 12.5 mg by mouth 2 (two) times daily with a meal.   cholecalciferol (VITAMIN D3) 25 MCG (1000 UNIT) tablet Take 1,000 Units by mouth daily.   cloNIDine (CATAPRES) 0.3 MG tablet Take 0.3 mg by mouth in the morning, at noon, and at bedtime.   Cyanocobalamin 1000 MCG SUBL Place 1,000 mcg under the tongue daily.   epoetin alfa (EPOGEN) 3000  UNIT/ML injection Inject into the skin.   ferrous sulfate 325 (65 FE) MG tablet Take 1 tablet (325 mg total) by mouth 2 (two) times daily with a meal. (Patient taking differently: Take 325 mg by mouth daily.)   hydrALAZINE (APRESOLINE) 100 MG tablet Take 100 mg by mouth in the morning, at noon, and at bedtime.   pantoprazole (PROTONIX) 40 MG tablet Take 1 tablet (40 mg total) by mouth 2 (two) times daily for 60 days, THEN 1 tablet (40 mg total) daily.   simvastatin (ZOCOR) 20 MG tablet Take 20 mg by mouth daily.   No facility-administered encounter medications on file as of 07/23/2022.    ALLERGIES:  No Known Allergies  LABORATORY DATA:  I have reviewed the labs as listed.  CBC    Component Value Date/Time   WBC 10.2 07/16/2022 1309   RBC 3.01 (L) 07/16/2022 1309   HGB 9.1 (L) 07/16/2022 1309   HCT 29.0 (L) 07/16/2022 1309   PLT 301 07/16/2022 1309   MCV 96.3 07/16/2022 1309   MCH 30.2 07/16/2022 1309   MCHC 31.4 07/16/2022 1309   RDW 15.0 07/16/2022 1309   LYMPHSABS 2.0 05/12/2022 1245   MONOABS 1.3 (H) 05/12/2022 1245   EOSABS 0.2 05/12/2022 1245   BASOSABS 0.0 05/12/2022 1245      Latest Ref Rng & Units 05/22/2022   10:58 AM 03/05/2022   10:58 AM 12/16/2021    7:01 AM  CMP  Glucose 70 - 99 mg/dL 161  096  045   BUN 8 - 23 mg/dL 56  32  35   Creatinine 0.61 - 1.24 mg/dL 4.09  8.11  9.14   Sodium 135 - 145 mmol/L 135  138  140   Potassium 3.5 - 5.1 mmol/L 3.8  4.3  3.9   Chloride 98 - 111 mmol/L 106  106  108   CO2 22 - 32 mmol/L 22  22  20    Calcium 8.9 - 10.3 mg/dL 8.4  8.6  8.8     DIAGNOSTIC IMAGING:  I have independently reviewed the relevant imaging and discussed with the patient.   WRAP UP:  All questions were answered. The patient knows to call the clinic with any problems, questions or concerns.  Medical decision making: Moderate  Time spent on visit: I spent 20 minutes counseling the patient face to face. The total time spent in the appointment  was 30  minutes and more than 50% was on counseling.  Carnella Guadalajara, PA-C  07/23/22 2:02 PM

## 2022-07-23 ENCOUNTER — Ambulatory Visit: Payer: Medicare Other | Admitting: Physician Assistant

## 2022-07-23 ENCOUNTER — Inpatient Hospital Stay: Payer: Medicare Other | Admitting: Physician Assistant

## 2022-07-23 ENCOUNTER — Inpatient Hospital Stay: Payer: Medicare Other | Attending: Hematology

## 2022-07-23 ENCOUNTER — Encounter: Payer: Self-pay | Admitting: Physician Assistant

## 2022-07-23 VITALS — BP 144/59 | HR 64 | Temp 97.8°F | Resp 18 | Wt 212.5 lb

## 2022-07-23 DIAGNOSIS — N184 Chronic kidney disease, stage 4 (severe): Secondary | ICD-10-CM | POA: Insufficient documentation

## 2022-07-23 DIAGNOSIS — D509 Iron deficiency anemia, unspecified: Secondary | ICD-10-CM

## 2022-07-23 DIAGNOSIS — N185 Chronic kidney disease, stage 5: Secondary | ICD-10-CM | POA: Diagnosis not present

## 2022-07-23 DIAGNOSIS — D631 Anemia in chronic kidney disease: Secondary | ICD-10-CM | POA: Diagnosis not present

## 2022-07-23 DIAGNOSIS — E538 Deficiency of other specified B group vitamins: Secondary | ICD-10-CM | POA: Diagnosis not present

## 2022-07-23 LAB — IMMUNOFIXATION ELECTROPHORESIS
IgA: 443 mg/dL — ABNORMAL HIGH (ref 61–437)
IgG (Immunoglobin G), Serum: 1894 mg/dL — ABNORMAL HIGH (ref 603–1613)
IgM (Immunoglobulin M), Srm: 81 mg/dL (ref 15–143)
Total Protein ELP: 7.4 g/dL (ref 6.0–8.5)

## 2022-07-23 MED ORDER — EPOETIN ALFA-EPBX 10000 UNIT/ML IJ SOLN
10000.0000 [IU] | Freq: Once | INTRAMUSCULAR | Status: AC
  Administered 2022-07-23: 10000 [IU] via SUBCUTANEOUS
  Filled 2022-07-23: qty 1

## 2022-07-23 NOTE — Progress Notes (Signed)
Patient presents today for Retacrit injection. Hemoglobin reviewed prior to administration. VSS tolerated without incident or complaint. See MAR for details. Patient stable during and after injection. Patient discharged in satisfactory condition with no s/s of distress noted.  

## 2022-07-23 NOTE — Patient Instructions (Signed)
[facility]  Discharge Instructions: Thank you for choosing [facility] to provide your oncology and hematology care.  If you have a lab appointment with the Cancer Center - please note that after April 8th, 2024, all labs will be drawn in the cancer center.  You do not have to check in or register with the main entrance as you have in the past but will complete your check-in in the cancer center.  Wear comfortable clothing and clothing appropriate for easy access to any Portacath or PICC line.   We strive to give you quality time with your provider. You may need to reschedule your appointment if you arrive late (15 or more minutes).  Arriving late affects you and other patients whose appointments are after yours.  Also, if you miss three or more appointments without notifying the office, you may be dismissed from the clinic at the provider's discretion.      For prescription refill requests, have your pharmacy contact our office and allow 72 hours for refills to be completed.    Today you received the following retacrit, return as scheduled.   To help prevent nausea and vomiting after your treatment, we encourage you to take your nausea medication as directed.  BELOW ARE SYMPTOMS THAT SHOULD BE REPORTED IMMEDIATELY: *FEVER GREATER THAN 100.4 F (38 C) OR HIGHER *CHILLS OR SWEATING *NAUSEA AND VOMITING THAT IS NOT CONTROLLED WITH YOUR NAUSEA MEDICATION *UNUSUAL SHORTNESS OF BREATH *UNUSUAL BRUISING OR BLEEDING *URINARY PROBLEMS (pain or burning when urinating, or frequent urination) *BOWEL PROBLEMS (unusual diarrhea, constipation, pain near the anus) TENDERNESS IN MOUTH AND THROAT WITH OR WITHOUT PRESENCE OF ULCERS (sore throat, sores in mouth, or a toothache) UNUSUAL RASH, SWELLING OR PAIN  UNUSUAL VAGINAL DISCHARGE OR ITCHING   Items with * indicate a potential emergency and should be followed up as soon as possible or go to the Emergency Department if  any problems should occur.  Please show the CHEMOTHERAPY ALERT CARD or IMMUNOTHERAPY ALERT CARD at check-in to the Emergency Department and triage nurse.  Should you have questions after your visit or need to cancel or reschedule your appointment, please contact [facility] [phone]  and follow the prompts.  Office hours are 8:00 a.m. to 4:30 p.m. Monday - Friday. Please note that voicemails left after 4:00 p.m. may not be returned until the following business day.  We are closed weekends and major holidays. You have access to a nurse at all times for urgent questions. Please call the main number to the clinic [phone] and follow the prompts.  For any non-urgent questions, you may also contact your provider using MyChart. We now offer e-Visits for anyone 18 and older to request care online for non-urgent symptoms. For details visit [email or web address].   Also download the MyChart app! Go to the app store, search "MyChart", open the app, select [facility], and log in with your MyChart username and password.   

## 2022-07-23 NOTE — Patient Instructions (Signed)
[facility] **VISIT SUMMARY & IMPORTANT INSTRUCTIONS **   You were seen today by Rojelio Brenner PA-C for your anemia.   Your blood levels (hemoglobin) have been low even after you received IV iron. Your chronic kidney disease is the main cause of your anemia. I recommend that we start you on treatment with RETACRIT injections given once every 2 weeks to help your body to make more healthy blood cells. We will check your blood counts on the same day as the injection.  FOLLOW-UP APPOINTMENT: Labs and follow-up visit in 1 month  ** Thank you for trusting me with your healthcare!  I strive to provide all of my patients with quality care at each visit.  If you receive a survey for this visit, I would be so grateful to you for taking the time to provide feedback.  Thank you in advance!  ~ Cortana Vanderford                   Dr. Doreatha Massed   &   Rojelio Brenner, PA-C   - - - - - - - - - - - - - - - - - -    Thank you for choosing [facility] at [facility] to provide your oncology and hematology care.  To afford each patient quality time with our provider, please arrive at least 15 minutes before your scheduled appointment time.   If you have a lab appointment with the Cancer Center please come in thru the Main Entrance and check in at the main information desk.  You need to re-schedule your appointment should you arrive 10 or more minutes late.  We strive to give you quality time with our providers, and arriving late affects you and other patients whose appointments are after yours.  Also, if you no show three or more times for appointments you may be dismissed from the clinic at the providers discretion.     Again, thank you for choosing [facility].  Our hope is that these requests will decrease the amount of time that you wait before being seen by our physicians.        _____________________________________________________________  Should you have questions after your visit to [facility], please contact our office at [phone] and follow the prompts.  Our office hours are 8:00 a.m. and 4:30 p.m. Monday - Friday.  Please note that voicemails left after 4:00 p.m. may not be returned until the following business day.  We are closed weekends and major holidays.  You do have access to a nurse 24-7, just call the main number to the clinic [phone] and do not press any options, hold on the line and a nurse will answer the phone.    For prescription refill requests, have your pharmacy contact our office and allow 72 hours.

## 2022-07-27 ENCOUNTER — Other Ambulatory Visit: Payer: Self-pay

## 2022-07-27 DIAGNOSIS — D631 Anemia in chronic kidney disease: Secondary | ICD-10-CM

## 2022-07-27 DIAGNOSIS — D509 Iron deficiency anemia, unspecified: Secondary | ICD-10-CM

## 2022-08-06 ENCOUNTER — Inpatient Hospital Stay: Payer: Medicare Other

## 2022-08-06 VITALS — BP 153/58 | HR 78 | Temp 97.4°F | Resp 20

## 2022-08-06 DIAGNOSIS — D631 Anemia in chronic kidney disease: Secondary | ICD-10-CM | POA: Diagnosis not present

## 2022-08-06 DIAGNOSIS — D509 Iron deficiency anemia, unspecified: Secondary | ICD-10-CM

## 2022-08-06 DIAGNOSIS — E538 Deficiency of other specified B group vitamins: Secondary | ICD-10-CM | POA: Diagnosis not present

## 2022-08-06 DIAGNOSIS — N184 Chronic kidney disease, stage 4 (severe): Secondary | ICD-10-CM | POA: Diagnosis not present

## 2022-08-06 DIAGNOSIS — D649 Anemia, unspecified: Secondary | ICD-10-CM

## 2022-08-06 LAB — CBC WITH DIFFERENTIAL/PLATELET
Abs Immature Granulocytes: 0.03 10*3/uL (ref 0.00–0.07)
Basophils Absolute: 0.1 10*3/uL (ref 0.0–0.1)
Basophils Relative: 1 %
Eosinophils Absolute: 0.6 10*3/uL — ABNORMAL HIGH (ref 0.0–0.5)
Eosinophils Relative: 6 %
HCT: 33.2 % — ABNORMAL LOW (ref 39.0–52.0)
Hemoglobin: 10.4 g/dL — ABNORMAL LOW (ref 13.0–17.0)
Immature Granulocytes: 0 %
Lymphocytes Relative: 24 %
Lymphs Abs: 2.2 10*3/uL (ref 0.7–4.0)
MCH: 30.4 pg (ref 26.0–34.0)
MCHC: 31.3 g/dL (ref 30.0–36.0)
MCV: 97.1 fL (ref 80.0–100.0)
Monocytes Absolute: 0.6 10*3/uL (ref 0.1–1.0)
Monocytes Relative: 7 %
Neutro Abs: 6 10*3/uL (ref 1.7–7.7)
Neutrophils Relative %: 62 %
Platelets: 279 10*3/uL (ref 150–400)
RBC: 3.42 MIL/uL — ABNORMAL LOW (ref 4.22–5.81)
RDW: 15.6 % — ABNORMAL HIGH (ref 11.5–15.5)
WBC: 9.5 10*3/uL (ref 4.0–10.5)
nRBC: 0 % (ref 0.0–0.2)

## 2022-08-06 LAB — IRON AND TIBC
Iron: 74 ug/dL (ref 45–182)
Saturation Ratios: 25 % (ref 17.9–39.5)
TIBC: 298 ug/dL (ref 250–450)
UIBC: 224 ug/dL

## 2022-08-06 LAB — FERRITIN: Ferritin: 280 ng/mL (ref 24–336)

## 2022-08-06 MED ORDER — EPOETIN ALFA-EPBX 10000 UNIT/ML IJ SOLN
10000.0000 [IU] | Freq: Once | INTRAMUSCULAR | Status: AC
  Administered 2022-08-06: 10000 [IU] via SUBCUTANEOUS
  Filled 2022-08-06: qty 1

## 2022-08-06 NOTE — Progress Notes (Signed)
Patient here for Retacrit injection. Patient tolerated injection with no complaints voiced.  Site clean and dry with no bruising or swelling noted.  No complaints of pain.  Discharged with vital signs stable and no signs or symptoms of distress noted.

## 2022-08-06 NOTE — Patient Instructions (Signed)
[facility]   Discharge Instructions: Thank you for choosing [facility] to provide your oncology and hematology care.  If you have a lab appointment with the Cancer Center - please note that after April 8th, 2024, all labs will be drawn in the cancer center.  You do not have to check in or register with the main entrance as you have in the past but will complete your check-in in the cancer center.  Wear comfortable clothing and clothing appropriate for easy access to any Portacath or PICC line.   We strive to give you quality time with your provider. You may need to reschedule your appointment if you arrive late (15 or more minutes).  Arriving late affects you and other patients whose appointments are after yours.  Also, if you miss three or more appointments without notifying the office, you may be dismissed from the clinic at the provider's discretion.      For prescription refill requests, have your pharmacy contact our office and allow 72 hours for refills to be completed.    Today you received the following Retacrit.Epoetin Alfa Injection What is this medication? EPOETIN ALFA (e POE e tin AL fa) treats low levels of red blood cells (anemia) caused by kidney disease, chemotherapy, or HIV medications. It can also be used in people who are at risk for blood loss during surgery. It works by helping your body make more red blood cells, which reduces the need for blood transfusions. This medicine may be used for other purposes; ask your health care provider or pharmacist if you have questions. COMMON BRAND NAME(S): Epogen, Procrit, Retacrit What should I tell my care team before I take this medication? They need to know if you have any of these conditions: Blood clots Cancer Heart disease High blood pressure On dialysis Seizures Stroke An unusual or allergic reaction to epoetin alfa, albumin, benzyl alcohol, other medications, foods, dyes, or  preservatives Pregnant or trying to get pregnant Breast-feeding How should I use this medication? This medication is injected into a vein or under the skin. It is usually given by your care team in a hospital or clinic setting. It may also be given at home. If you get this medication at home, you will be taught how to prepare and give it. Use exactly as directed. Take it as directed on the prescription label at the same time every day. Keep taking it unless your care team tells you to stop. It is important that you put your used needles and syringes in a special sharps container. Do not put them in a trash can. If you do not have a sharps container, call your pharmacist or care team to get one. A special MedGuide will be given to you by the pharmacist with each prescription and refill. Be sure to read this information carefully each time. Talk to your care team about the use of this medication in children. While this medication may be used in children as young as 1 month of age for selected conditions, precautions do apply. Overdosage: If you think you have taken too much of this medicine contact a poison control center or emergency room at once. NOTE: This medicine is only for you. Do not share this medicine with others. What if I miss a dose? If you miss a dose, take it as soon as you can. If it is almost time for your next dose, take only that dose. Do not take double or extra doses.   What may interact with this medication? Darbepoetin alfa Methoxy polyethylene glycol-epoetin beta This list may not describe all possible interactions. Give your health care provider a list of all the medicines, herbs, non-prescription drugs, or dietary supplements you use. Also tell them if you smoke, drink alcohol, or use illegal drugs. Some items may interact with your medicine. What should I watch for while using this medication? Visit your care team for regular checks on your progress. Check your blood pressure  as directed. Know what your blood pressure should be and when to contact your care team. Your condition will be monitored carefully while you are receiving this medication. You may need blood work while taking this medication. What side effects may I notice from receiving this medication? Side effects that you should report to your care team as soon as possible: Allergic reactions--skin rash, itching, hives, swelling of the face, lips, tongue, or throat Blood clot--pain, swelling, or warmth in the leg, shortness of breath, chest pain Heart attack--pain or tightness in the chest, shoulders, arms, or jaw, nausea, shortness of breath, cold or clammy skin, feeling faint or lightheaded Increase in blood pressure Rash, fever, and swollen lymph nodes Redness, blistering, peeling, or loosening of the skin, including inside the mouth Seizures Stroke--sudden numbness or weakness of the face, arm, or leg, trouble speaking, confusion, trouble walking, loss of balance or coordination, dizziness, severe headache, change in vision Side effects that usually do not require medical attention (report to your care team if they continue or are bothersome): Bone, joint, or muscle pain Cough Headache Nausea Pain, redness, or irritation at injection site This list may not describe all possible side effects. Call your doctor for medical advice about side effects. You may report side effects to FDA at 1-800-FDA-1088. Where should I keep my medication? Keep out of the reach of children and pets. Store in a refrigerator. Do not freeze. Do not shake. Protect from light. Keep this medication in the original container until you are ready to take it. See product for storage information. Get rid of any unused medication after the expiration date. To get rid of medications that are no longer needed or have expired: Take the medication to a medication take-back program. Check with your pharmacy or law enforcement to find a  location. If you cannot return the medication, ask your pharmacist or care team how to get rid of the medication safely. NOTE: This sheet is a summary. It may not cover all possible information. If you have questions about this medicine, talk to your doctor, pharmacist, or health care provider.  2023 Elsevier/Gold Standard (2021-06-17 00:00:00)     To help prevent nausea and vomiting after your treatment, we encourage you to take your nausea medication as directed.  BELOW ARE SYMPTOMS THAT SHOULD BE REPORTED IMMEDIATELY: *FEVER GREATER THAN 100.4 F (38 C) OR HIGHER *CHILLS OR SWEATING *NAUSEA AND VOMITING THAT IS NOT CONTROLLED WITH YOUR NAUSEA MEDICATION *UNUSUAL SHORTNESS OF BREATH *UNUSUAL BRUISING OR BLEEDING *URINARY PROBLEMS (pain or burning when urinating, or frequent urination) *BOWEL PROBLEMS (unusual diarrhea, constipation, pain near the anus) TENDERNESS IN MOUTH AND THROAT WITH OR WITHOUT PRESENCE OF ULCERS (sore throat, sores in mouth, or a toothache) UNUSUAL RASH, SWELLING OR PAIN  UNUSUAL VAGINAL DISCHARGE OR ITCHING   Items with * indicate a potential emergency and should be followed up as soon as possible or go to the Emergency Department if any problems should occur.  Please show the CHEMOTHERAPY ALERT CARD or IMMUNOTHERAPY ALERT CARD   at check-in to the Emergency Department and triage nurse.  Should you have questions after your visit or need to cancel or reschedule your appointment, please contact [facility] [phone]  and follow the prompts.  Office hours are 8:00 a.m. to 4:30 p.m. Monday - Friday. Please note that voicemails left after 4:00 p.m. may not be returned until the following business day.  We are closed weekends and major holidays. You have access to a nurse at all times for urgent questions. Please call the main number to the clinic [phone] and follow the prompts.  For any non-urgent questions, you may also contact your  provider using MyChart. We now offer e-Visits for anyone 18 and older to request care online for non-urgent symptoms. For details visit [email or web address].   Also download the MyChart app! Go to the app store, search "MyChart", open the app, select [facility], and log in with your MyChart username and password.   

## 2022-08-07 LAB — KAPPA/LAMBDA LIGHT CHAINS
Kappa free light chain: 192.3 mg/L — ABNORMAL HIGH (ref 3.3–19.4)
Kappa, lambda light chain ratio: 2.62 — ABNORMAL HIGH (ref 0.26–1.65)
Lambda free light chains: 73.3 mg/L — ABNORMAL HIGH (ref 5.7–26.3)

## 2022-08-13 DIAGNOSIS — E1165 Type 2 diabetes mellitus with hyperglycemia: Secondary | ICD-10-CM | POA: Diagnosis not present

## 2022-08-13 DIAGNOSIS — I10 Essential (primary) hypertension: Secondary | ICD-10-CM | POA: Diagnosis not present

## 2022-08-13 DIAGNOSIS — E559 Vitamin D deficiency, unspecified: Secondary | ICD-10-CM | POA: Diagnosis not present

## 2022-08-13 DIAGNOSIS — R5383 Other fatigue: Secondary | ICD-10-CM | POA: Diagnosis not present

## 2022-08-13 DIAGNOSIS — K219 Gastro-esophageal reflux disease without esophagitis: Secondary | ICD-10-CM | POA: Diagnosis not present

## 2022-08-13 DIAGNOSIS — E7849 Other hyperlipidemia: Secondary | ICD-10-CM | POA: Diagnosis not present

## 2022-08-18 ENCOUNTER — Encounter: Payer: Self-pay | Admitting: Physician Assistant

## 2022-08-20 ENCOUNTER — Inpatient Hospital Stay: Payer: Medicare Other

## 2022-08-20 DIAGNOSIS — E7849 Other hyperlipidemia: Secondary | ICD-10-CM | POA: Diagnosis not present

## 2022-08-20 DIAGNOSIS — N2581 Secondary hyperparathyroidism of renal origin: Secondary | ICD-10-CM | POA: Diagnosis not present

## 2022-08-20 DIAGNOSIS — E1122 Type 2 diabetes mellitus with diabetic chronic kidney disease: Secondary | ICD-10-CM | POA: Diagnosis not present

## 2022-08-20 DIAGNOSIS — N184 Chronic kidney disease, stage 4 (severe): Secondary | ICD-10-CM | POA: Diagnosis not present

## 2022-08-20 DIAGNOSIS — D519 Vitamin B12 deficiency anemia, unspecified: Secondary | ICD-10-CM | POA: Diagnosis not present

## 2022-08-20 DIAGNOSIS — I10 Essential (primary) hypertension: Secondary | ICD-10-CM | POA: Diagnosis not present

## 2022-08-20 DIAGNOSIS — E559 Vitamin D deficiency, unspecified: Secondary | ICD-10-CM | POA: Diagnosis not present

## 2022-08-20 DIAGNOSIS — R809 Proteinuria, unspecified: Secondary | ICD-10-CM | POA: Diagnosis not present

## 2022-08-20 DIAGNOSIS — E538 Deficiency of other specified B group vitamins: Secondary | ICD-10-CM | POA: Diagnosis not present

## 2022-08-21 DIAGNOSIS — E782 Mixed hyperlipidemia: Secondary | ICD-10-CM | POA: Diagnosis not present

## 2022-08-21 DIAGNOSIS — I10 Essential (primary) hypertension: Secondary | ICD-10-CM | POA: Diagnosis not present

## 2022-08-21 DIAGNOSIS — N184 Chronic kidney disease, stage 4 (severe): Secondary | ICD-10-CM | POA: Diagnosis not present

## 2022-08-24 NOTE — Progress Notes (Deleted)
NO SHOW

## 2022-08-25 ENCOUNTER — Other Ambulatory Visit: Payer: Medicare Other

## 2022-08-25 ENCOUNTER — Inpatient Hospital Stay: Payer: Medicare Other | Admitting: Physician Assistant

## 2022-08-25 ENCOUNTER — Inpatient Hospital Stay: Payer: Medicare Other

## 2022-08-25 ENCOUNTER — Inpatient Hospital Stay: Payer: Medicare Other | Attending: Hematology

## 2022-08-25 DIAGNOSIS — E538 Deficiency of other specified B group vitamins: Secondary | ICD-10-CM | POA: Diagnosis not present

## 2022-08-25 DIAGNOSIS — N184 Chronic kidney disease, stage 4 (severe): Secondary | ICD-10-CM | POA: Insufficient documentation

## 2022-08-25 DIAGNOSIS — D631 Anemia in chronic kidney disease: Secondary | ICD-10-CM | POA: Diagnosis not present

## 2022-08-25 DIAGNOSIS — D509 Iron deficiency anemia, unspecified: Secondary | ICD-10-CM

## 2022-08-25 DIAGNOSIS — Z7982 Long term (current) use of aspirin: Secondary | ICD-10-CM | POA: Insufficient documentation

## 2022-08-25 DIAGNOSIS — Z79899 Other long term (current) drug therapy: Secondary | ICD-10-CM | POA: Insufficient documentation

## 2022-08-25 LAB — CBC WITH DIFFERENTIAL/PLATELET
Abs Immature Granulocytes: 0.02 10*3/uL (ref 0.00–0.07)
Basophils Absolute: 0 10*3/uL (ref 0.0–0.1)
Basophils Relative: 1 %
Eosinophils Absolute: 0.5 10*3/uL (ref 0.0–0.5)
Eosinophils Relative: 6 %
HCT: 37.1 % — ABNORMAL LOW (ref 39.0–52.0)
Hemoglobin: 11.8 g/dL — ABNORMAL LOW (ref 13.0–17.0)
Immature Granulocytes: 0 %
Lymphocytes Relative: 21 %
Lymphs Abs: 1.8 10*3/uL (ref 0.7–4.0)
MCH: 30.3 pg (ref 26.0–34.0)
MCHC: 31.8 g/dL (ref 30.0–36.0)
MCV: 95.4 fL (ref 80.0–100.0)
Monocytes Absolute: 0.6 10*3/uL (ref 0.1–1.0)
Monocytes Relative: 6 %
Neutro Abs: 5.7 10*3/uL (ref 1.7–7.7)
Neutrophils Relative %: 66 %
Platelets: 301 10*3/uL (ref 150–400)
RBC: 3.89 MIL/uL — ABNORMAL LOW (ref 4.22–5.81)
RDW: 14.6 % (ref 11.5–15.5)
WBC: 8.6 10*3/uL (ref 4.0–10.5)
nRBC: 0 % (ref 0.0–0.2)

## 2022-08-25 LAB — COMPREHENSIVE METABOLIC PANEL
ALT: 13 U/L (ref 0–44)
AST: 19 U/L (ref 15–41)
Albumin: 4 g/dL (ref 3.5–5.0)
Alkaline Phosphatase: 74 U/L (ref 38–126)
Anion gap: 11 (ref 5–15)
BUN: 50 mg/dL — ABNORMAL HIGH (ref 8–23)
CO2: 23 mmol/L (ref 22–32)
Calcium: 8.9 mg/dL (ref 8.9–10.3)
Chloride: 104 mmol/L (ref 98–111)
Creatinine, Ser: 4.23 mg/dL — ABNORMAL HIGH (ref 0.61–1.24)
GFR, Estimated: 14 mL/min — ABNORMAL LOW (ref >60)
Glucose, Bld: 173 mg/dL — ABNORMAL HIGH (ref 70–99)
Potassium: 4.1 mmol/L (ref 3.5–5.1)
Sodium: 138 mmol/L (ref 135–145)
Total Bilirubin: 0.4 mg/dL (ref 0.3–1.2)
Total Protein: 8.8 g/dL — ABNORMAL HIGH (ref 6.5–8.1)

## 2022-08-25 LAB — FERRITIN: Ferritin: 335 ng/mL (ref 24–336)

## 2022-08-25 LAB — IRON AND TIBC
Iron: 71 ug/dL (ref 45–182)
Saturation Ratios: 22 % (ref 17.9–39.5)
TIBC: 326 ug/dL (ref 250–450)
UIBC: 255 ug/dL

## 2022-08-25 NOTE — Progress Notes (Signed)
Patient's hemoglobin 11.8 no injection needed today d/t parameters.

## 2022-09-01 ENCOUNTER — Ambulatory Visit: Payer: Medicare Other | Admitting: Physician Assistant

## 2022-09-03 ENCOUNTER — Inpatient Hospital Stay: Payer: Medicare Other | Admitting: Physician Assistant

## 2022-09-03 ENCOUNTER — Inpatient Hospital Stay: Payer: Medicare Other

## 2022-09-09 ENCOUNTER — Other Ambulatory Visit: Payer: Self-pay

## 2022-09-09 DIAGNOSIS — D509 Iron deficiency anemia, unspecified: Secondary | ICD-10-CM

## 2022-09-09 NOTE — Progress Notes (Unsigned)
[facility] [address], [place] [address]   CLINIC:  Medical Oncology/Hematology  PCP:  Richardean Chimera, MD [address] [place] [address] [phone]   REASON FOR VISIT:  Follow-up for normocytic anemia  CURRENT THERAPY: IV iron, B12, Rectrit  INTERVAL HISTORY:   Devin [name] 77 y.o. male returns for routine follow-up of normocytic anemia.  He was last seen by Rojelio Brenner PA-C on 07/23/2022.    Since his last visit, he was started on Retacrit injections which were given on 07/23/2022 and 08/06/2022.  He did not require injection on 08/25/2022 (Hgb 11.8).  He has been tolerating Retacrit injections well without any symptoms of DVT/PE or significant increases in blood pressure.  (BP today is 162/66, which is about his usual baseline.)  He continues to deny any rectal bleeding, melena, epistaxis, or other sources of blood loss.  He denies any abnormal fatigue and reports that his energy is a bit higher than baseline.  He denies any abnormal headaches, chest pain, dyspnea, lightheadedness, or syncope.  He has 75% energy and 100% appetite. He endorses that he is maintaining a stable weight.  ASSESSMENT & PLAN:  1.  Normocytic anemia, secondary to CKD and iron deficiency - Patient has required intermittent IV iron (received 1 g parenteral iron at [facility] in June 2023).  Most recently received Feraheme x 1 on 03/25/2022. - Stool cards positive for blood.  EGD/colonoscopy on 05/26/2022 revealed gastric polyps with stigmata of recent bleeding, colon polyps, nonbleeding internal hemorrhoids, and diverticulosis - He follows with Dr. Wolfgang Phoenix for CKD stage V. - ESA therapy with Retacrit initiated on 07/23/2022.  Current dose is Retacrit 10,000 units every 2 weeks.  Injection was not needed on 08/25/2022 due to Hgb 11.8. - Immunofixation shows polyclonal increase in immunoglobulins.  Light chain ratio elevated at 2.73 (kappa 216.5, lambda 79.2).  SPEP negative for M spike in  December 2023. - Most recent lab panel (08/25/2022): Ferritin 335, iron saturation 22%.  Creatinine 4.23/GFR 14 - CBC today (09/10/2022): Hgb 10.7/MCV 95.2 - Denies any bleeding per rectum or melena.  Denies any ice pica. - DIFFERENTIAL DIAGNOSIS favors combination anemia from CKD, relative iron deficiency, and B12 deficiency - PLAN: Continue Retacrit 10,000 units, but decreased frequency to every 4 weeks - Repeat CBC, CMP, ferritin, iron/TIBC with office visit in 3 months   2.  B12 deficiency: - Labs from 11/28/2021 show low vitamin B12 at 177, with MMA elevated at 562 - Patient is taking vitamin B12 1000 mcg daily - Most recent labs (05/12/2022) show improved vitamin B12 at 1231, MMA normal.   - PLAN: Continue vitamin B12 1000 mcg tablet daily.  Recheck B12/MMA in 6 to 12 months (August 2024).  3.  CKD stage V - Follows with Dr. Wolfgang Phoenix - Left brachiocephalic AV fistula placed on 6/57/8469 - He has progressive renal insufficiency, but not yet on dialysis   4.  Social/family history: - Other PMH: Hypertension, diabetes, arthritis, CKD stage V - He is a retired Producer, television/film/video and is independent of ADLs and IADLs.  Quit smoking cigarettes 30 years ago. - Father had prostate cancer.   PLAN SUMMARY: >> CBC + Retacrit every 4 weeks >> Labs in 3 months = CBC/D, CMP, ferritin, iron/TIBC, B12, MMA >> OFFICE visit in 3 months (1 week after labs)      REVIEW OF SYSTEMS:   Review of Systems  Constitutional:  Negative for appetite change, chills, diaphoresis, fatigue,  fever and unexpected weight change.  HENT:   Negative for lump/mass and nosebleeds.   Eyes:  Negative for eye problems.  Respiratory:  Negative for cough, hemoptysis and shortness of breath.   Cardiovascular:  Negative for chest pain, leg swelling and palpitations.  Gastrointestinal:  Negative for abdominal pain, blood in stool, constipation, diarrhea, nausea and vomiting.  Genitourinary:  Negative for hematuria.   Skin:  Negative.   Neurological:  Negative for dizziness, headaches and light-headedness.  Hematological:  Does not bruise/bleed easily.     PHYSICAL EXAM:  ECOG PERFORMANCE STATUS: 0 - Asymptomatic  There were no vitals filed for this visit. There were no vitals filed for this visit. Physical Exam Constitutional:      Appearance: Normal appearance. He is obese.  Cardiovascular:     Heart sounds: Normal heart sounds.  Pulmonary:     Breath sounds: Normal breath sounds.  Neurological:     General: No focal deficit present.     Mental Status: Mental status is at baseline.  Psychiatric:        Behavior: Behavior normal. Behavior is cooperative.     PAST MEDICAL/SURGICAL HISTORY:  Past Medical History:  Diagnosis Date   Arthritis    CKD (chronic kidney disease) stage 4, GFR 15-29 ml/min (HCC)    Diabetes mellitus without complication (HCC)    Gout    Hypertension    Past Surgical History:  Procedure Laterality Date   AV FISTULA PLACEMENT Left 12/16/2021   Procedure: LEFT ARM ARTERIOVENOUS (AV) FISTULA CREATION;  Surgeon: Larina Earthly, MD;  Location: [facility];  Service: Vascular;  Laterality: Left;   BIOPSY  05/26/2022   Procedure: BIOPSY;  Surgeon: Dolores Frame, MD;  Location: [facility];  Service: Gastroenterology;;   COLONOSCOPY WITH PROPOFOL N/A 05/26/2022   Procedure: COLONOSCOPY WITH PROPOFOL;  Surgeon: Dolores Frame, MD;  Location: [facility];  Service: Gastroenterology;  Laterality: N/A;  200pm, asa 3   ESOPHAGOGASTRODUODENOSCOPY (EGD) WITH PROPOFOL N/A 05/26/2022   Procedure: ESOPHAGOGASTRODUODENOSCOPY (EGD) WITH PROPOFOL;  Surgeon: Dolores Frame, MD;  Location: [facility];  Service: Gastroenterology;  Laterality: N/A;   HEMOSTASIS CLIP PLACEMENT  05/26/2022   Procedure: HEMOSTASIS CLIP PLACEMENT;  Surgeon: Dolores Frame, MD;  Location: [facility];  Service: Gastroenterology;;   KNEE SURGERY     POLYPECTOMY  05/26/2022    Procedure: POLYPECTOMY;  Surgeon: Marguerita Merles, Reuel Boom, MD;  Location: [facility];  Service: Gastroenterology;;   TONSILLECTOMY      SOCIAL HISTORY:  Social History   Socioeconomic History   Marital status: Married    Spouse name: Not on file   Number of children: Not on file   Years of education: Not on file   Highest education level: Not on file  Occupational History   Not on file  Tobacco Use   Smoking status: Former    Years: 20    Types: Cigarettes    Quit date: 84    Years since quitting: 31.4    Passive exposure: Past   Smokeless tobacco: Never  Vaping Use   Vaping Use: Never used  Substance and Sexual Activity   Alcohol use: Never   Drug use: Never   Sexual activity: Yes  Other Topics Concern   Not on file  Social History Narrative   Not on file   Social Determinants of Health   Financial Resource Strain: Not on file  Food Insecurity: Not on file  Transportation Needs: Not  on file  Physical Activity: Not on file  Stress: Not on file  Social Connections: Not on file  Intimate Partner Violence: Not on file    FAMILY HISTORY:  Family History  Problem Relation Age of Onset   Cancer Father    Stroke Sister    Kidney disease Sister    Heart disease Brother     CURRENT MEDICATIONS:  Outpatient Encounter Medications as of 09/10/2022  Medication Sig   aspirin EC 81 MG tablet Take 81 mg by mouth daily. Swallow whole.   carvedilol (COREG) 12.5 MG tablet Take 12.5 mg by mouth 2 (two) times daily with a meal.   cholecalciferol (VITAMIN D3) 25 MCG (1000 UNIT) tablet Take 1,000 Units by mouth daily.   cloNIDine (CATAPRES) 0.3 MG tablet Take 0.3 mg by mouth in the morning, at noon, and at bedtime.   Cyanocobalamin 1000 MCG SUBL Place 1,000 mcg under the tongue daily.   epoetin alfa (EPOGEN) 3000 UNIT/ML injection Inject into the skin.   ferrous sulfate 325 (65 FE) MG tablet Take 1 tablet (325 mg total) by mouth 2 (two) times daily with a meal. (Patient  taking differently: Take 325 mg by mouth daily.)   hydrALAZINE (APRESOLINE) 100 MG tablet Take 100 mg by mouth in the morning, at noon, and at bedtime.   pantoprazole (PROTONIX) 40 MG tablet Take 1 tablet (40 mg total) by mouth 2 (two) times daily for 60 days, THEN 1 tablet (40 mg total) daily.   simvastatin (ZOCOR) 20 MG tablet Take 20 mg by mouth daily.   No facility-administered encounter medications on file as of 09/10/2022.    ALLERGIES:  No Known Allergies  LABORATORY DATA:  I have reviewed the labs as listed.  CBC    Component Value Date/Time   WBC 8.6 08/25/2022 0919   RBC 3.89 (L) 08/25/2022 0919   HGB 11.8 (L) 08/25/2022 0919   HCT 37.1 (L) 08/25/2022 0919   PLT 301 08/25/2022 0919   MCV 95.4 08/25/2022 0919   MCH 30.3 08/25/2022 0919   MCHC 31.8 08/25/2022 0919   RDW 14.6 08/25/2022 0919   LYMPHSABS 1.8 08/25/2022 0919   MONOABS 0.6 08/25/2022 0919   EOSABS 0.5 08/25/2022 0919   BASOSABS 0.0 08/25/2022 0919      Latest Ref Rng & Units 08/25/2022    9:19 AM 05/22/2022   10:58 AM 03/05/2022   10:58 AM  CMP  Glucose 70 - 99 mg/dL 161  096  045   BUN 8 - 23 mg/dL 50  56  32   Creatinine 0.61 - 1.24 mg/dL 4.09  8.11  9.14   Sodium 135 - 145 mmol/L 138  135  138   Potassium 3.5 - 5.1 mmol/L 4.1  3.8  4.3   Chloride 98 - 111 mmol/L 104  106  106   CO2 22 - 32 mmol/L 23  22  22    Calcium 8.9 - 10.3 mg/dL 8.9  8.4  8.6   Total Protein 6.5 - 8.1 g/dL 8.8     Total Bilirubin 0.3 - 1.2 mg/dL 0.4     Alkaline Phos 38 - 126 U/L 74     AST 15 - 41 U/L 19     ALT 0 - 44 U/L 13       DIAGNOSTIC IMAGING:  I have independently reviewed the relevant imaging and discussed with the patient.   WRAP UP:  All questions were answered. The patient knows to call the clinic with any  problems, questions or concerns.  Medical decision making: Low  Time spent on visit: I spent 15 minutes counseling the patient face to face. The total time spent in the appointment was 22 minutes and  more than 50% was on counseling.  Carnella Guadalajara, PA-C  09/10/22 3:20 PM

## 2022-09-10 ENCOUNTER — Inpatient Hospital Stay: Payer: Medicare Other

## 2022-09-10 ENCOUNTER — Inpatient Hospital Stay: Payer: Medicare Other | Admitting: Physician Assistant

## 2022-09-10 VITALS — BP 162/66 | HR 50 | Temp 98.0°F | Resp 16 | Wt 218.9 lb

## 2022-09-10 DIAGNOSIS — D631 Anemia in chronic kidney disease: Secondary | ICD-10-CM | POA: Diagnosis not present

## 2022-09-10 DIAGNOSIS — D509 Iron deficiency anemia, unspecified: Secondary | ICD-10-CM

## 2022-09-10 DIAGNOSIS — N185 Chronic kidney disease, stage 5: Secondary | ICD-10-CM | POA: Diagnosis not present

## 2022-09-10 DIAGNOSIS — Z79899 Other long term (current) drug therapy: Secondary | ICD-10-CM | POA: Diagnosis not present

## 2022-09-10 DIAGNOSIS — E538 Deficiency of other specified B group vitamins: Secondary | ICD-10-CM | POA: Diagnosis not present

## 2022-09-10 DIAGNOSIS — Z7982 Long term (current) use of aspirin: Secondary | ICD-10-CM | POA: Diagnosis not present

## 2022-09-10 DIAGNOSIS — N184 Chronic kidney disease, stage 4 (severe): Secondary | ICD-10-CM | POA: Diagnosis not present

## 2022-09-10 LAB — CBC
HCT: 33.6 % — ABNORMAL LOW (ref 39.0–52.0)
Hemoglobin: 10.7 g/dL — ABNORMAL LOW (ref 13.0–17.0)
MCH: 30.3 pg (ref 26.0–34.0)
MCHC: 31.8 g/dL (ref 30.0–36.0)
MCV: 95.2 fL (ref 80.0–100.0)
Platelets: 271 10*3/uL (ref 150–400)
RBC: 3.53 MIL/uL — ABNORMAL LOW (ref 4.22–5.81)
RDW: 14.1 % (ref 11.5–15.5)
WBC: 8.4 10*3/uL (ref 4.0–10.5)
nRBC: 0 % (ref 0.0–0.2)

## 2022-09-10 MED ORDER — EPOETIN ALFA-EPBX 10000 UNIT/ML IJ SOLN
10000.0000 [IU] | Freq: Once | INTRAMUSCULAR | Status: AC
  Administered 2022-09-10: 10000 [IU] via SUBCUTANEOUS
  Filled 2022-09-10: qty 1

## 2022-09-10 NOTE — Patient Instructions (Signed)
[facility]  Discharge Instructions: Thank you for choosing [facility] to provide your oncology and hematology care.  If you have a lab appointment with the Cancer Center - please note that after April 8th, 2024, all labs will be drawn in the cancer center.  You do not have to check in or register with the main entrance as you have in the past but will complete your check-in in the cancer center.  Wear comfortable clothing and clothing appropriate for easy access to any Portacath or PICC line.   We strive to give you quality time with your provider. You may need to reschedule your appointment if you arrive late (15 or more minutes).  Arriving late affects you and other patients whose appointments are after yours.  Also, if you miss three or more appointments without notifying the office, you may be dismissed from the clinic at the provider's discretion.      For prescription refill requests, have your pharmacy contact our office and allow 72 hours for refills to be completed.    Today you received the following chemotherapy and/or immunotherapy agents Retacrit      To help prevent nausea and vomiting after your treatment, we encourage you to take your nausea medication as directed.  BELOW ARE SYMPTOMS THAT SHOULD BE REPORTED IMMEDIATELY: *FEVER GREATER THAN 100.4 F (38 C) OR HIGHER *CHILLS OR SWEATING *NAUSEA AND VOMITING THAT IS NOT CONTROLLED WITH YOUR NAUSEA MEDICATION *UNUSUAL SHORTNESS OF BREATH *UNUSUAL BRUISING OR BLEEDING *URINARY PROBLEMS (pain or burning when urinating, or frequent urination) *BOWEL PROBLEMS (unusual diarrhea, constipation, pain near the anus) TENDERNESS IN MOUTH AND THROAT WITH OR WITHOUT PRESENCE OF ULCERS (sore throat, sores in mouth, or a toothache) UNUSUAL RASH, SWELLING OR PAIN  UNUSUAL VAGINAL DISCHARGE OR ITCHING   Items with * indicate a potential emergency and should be followed up as soon as possible or go to the  Emergency Department if any problems should occur.  Please show the CHEMOTHERAPY ALERT CARD or IMMUNOTHERAPY ALERT CARD at check-in to the Emergency Department and triage nurse.  Should you have questions after your visit or need to cancel or reschedule your appointment, please contact [facility] [phone]  and follow the prompts.  Office hours are 8:00 a.m. to 4:30 p.m. Monday - Friday. Please note that voicemails left after 4:00 p.m. may not be returned until the following business day.  We are closed weekends and major holidays. You have access to a nurse at all times for urgent questions. Please call the main number to the clinic [phone] and follow the prompts.  For any non-urgent questions, you may also contact your provider using MyChart. We now offer e-Visits for anyone 18 and older to request care online for non-urgent symptoms. For details visit [email or web address].   Also download the MyChart app! Go to the app store, search "MyChart", open the app, select [facility], and log in with your MyChart username and password.   

## 2022-09-10 NOTE — Progress Notes (Signed)
Patient presents today for Retacrit injection per providers order.  BP noted to be 162/66, PA notified.  Message received from [facility] PA, okay to proceed with injection.  Stable during administration without incident; injection site WNL; see MAR for injection details.  Patient tolerated procedure well and without incident.  No questions or complaints noted at this time.

## 2022-09-10 NOTE — Patient Instructions (Signed)
[facility] **VISIT SUMMARY & IMPORTANT INSTRUCTIONS **   You were seen today by Rojelio Brenner PA-C for your anemia.   Your chronic kidney disease is the main cause of your anemia. Your blood levels (hemoglobin) look much better after you started Retacrit shots. Since your blood counts are improved, we will switch your injections so that you only get the RETACRIT INJECTION ONCE A MONTH (instead of every 2 weeks).  FOLLOW-UP APPOINTMENT: Labs and follow-up visit in 3 months  ** Thank you for trusting me with your healthcare!  I strive to provide all of my patients with quality care at each visit.  If you receive a survey for this visit, I would be so grateful to you for taking the time to provide feedback.  Thank you in advance!  ~ Elin Seats                   Dr. Doreatha Massed   &   Rojelio Brenner, PA-C   - - - - - - - - - - - - - - - - - -    Thank you for choosing [facility] at [facility] to provide your oncology and hematology care.  To afford each patient quality time with our provider, please arrive at least 15 minutes before your scheduled appointment time.   If you have a lab appointment with the Cancer Center please come in thru the Main Entrance and check in at the main information desk.  You need to re-schedule your appointment should you arrive 10 or more minutes late.  We strive to give you quality time with our providers, and arriving late affects you and other patients whose appointments are after yours.  Also, if you no show three or more times for appointments you may be dismissed from the clinic at the providers discretion.     Again, thank you for choosing [facility].  Our hope is that these requests will decrease the amount of time that you wait before being seen by our physicians.       _____________________________________________________________  Should you have questions after your  visit to [facility], please contact our office at [phone] and follow the prompts.  Our office hours are 8:00 a.m. and 4:30 p.m. Monday - Friday.  Please note that voicemails left after 4:00 p.m. may not be returned until the following business day.  We are closed weekends and major holidays.  You do have access to a nurse 24-7, just call the main number to the clinic [phone] and do not press any options, hold on the line and a nurse will answer the phone.    For prescription refill requests, have your pharmacy contact our office and allow 72 hours.

## 2022-09-20 DIAGNOSIS — I10 Essential (primary) hypertension: Secondary | ICD-10-CM | POA: Diagnosis not present

## 2022-09-20 DIAGNOSIS — N184 Chronic kidney disease, stage 4 (severe): Secondary | ICD-10-CM | POA: Diagnosis not present

## 2022-09-20 DIAGNOSIS — E782 Mixed hyperlipidemia: Secondary | ICD-10-CM | POA: Diagnosis not present

## 2022-10-08 ENCOUNTER — Inpatient Hospital Stay: Payer: Medicare Other | Attending: Hematology

## 2022-10-08 ENCOUNTER — Inpatient Hospital Stay: Payer: Medicare Other

## 2022-10-08 DIAGNOSIS — N184 Chronic kidney disease, stage 4 (severe): Secondary | ICD-10-CM | POA: Diagnosis not present

## 2022-10-08 DIAGNOSIS — D631 Anemia in chronic kidney disease: Secondary | ICD-10-CM | POA: Insufficient documentation

## 2022-10-08 DIAGNOSIS — E538 Deficiency of other specified B group vitamins: Secondary | ICD-10-CM | POA: Diagnosis not present

## 2022-10-08 LAB — CBC
HCT: 36.4 % — ABNORMAL LOW (ref 39.0–52.0)
Hemoglobin: 11.5 g/dL — ABNORMAL LOW (ref 13.0–17.0)
MCH: 29.6 pg (ref 26.0–34.0)
MCHC: 31.6 g/dL (ref 30.0–36.0)
MCV: 93.8 fL (ref 80.0–100.0)
Platelets: 297 10*3/uL (ref 150–400)
RBC: 3.88 MIL/uL — ABNORMAL LOW (ref 4.22–5.81)
RDW: 13.5 % (ref 11.5–15.5)
WBC: 8.7 10*3/uL (ref 4.0–10.5)
nRBC: 0 % (ref 0.0–0.2)

## 2022-10-08 NOTE — Progress Notes (Signed)
Hemoglobin today is 11.5.  Hold Retacrit today per parameters.  Patient left ambulatory in stable condition.

## 2022-10-14 DIAGNOSIS — E1122 Type 2 diabetes mellitus with diabetic chronic kidney disease: Secondary | ICD-10-CM | POA: Diagnosis not present

## 2022-10-14 DIAGNOSIS — N185 Chronic kidney disease, stage 5: Secondary | ICD-10-CM | POA: Diagnosis not present

## 2022-10-14 DIAGNOSIS — R809 Proteinuria, unspecified: Secondary | ICD-10-CM | POA: Diagnosis not present

## 2022-10-14 DIAGNOSIS — E1129 Type 2 diabetes mellitus with other diabetic kidney complication: Secondary | ICD-10-CM | POA: Diagnosis not present

## 2022-11-05 ENCOUNTER — Inpatient Hospital Stay: Payer: Medicare Other | Attending: Hematology

## 2022-11-05 ENCOUNTER — Inpatient Hospital Stay: Payer: Medicare Other

## 2022-11-05 DIAGNOSIS — E538 Deficiency of other specified B group vitamins: Secondary | ICD-10-CM | POA: Diagnosis not present

## 2022-11-05 DIAGNOSIS — D631 Anemia in chronic kidney disease: Secondary | ICD-10-CM | POA: Insufficient documentation

## 2022-11-05 DIAGNOSIS — N184 Chronic kidney disease, stage 4 (severe): Secondary | ICD-10-CM | POA: Diagnosis not present

## 2022-11-05 DIAGNOSIS — N185 Chronic kidney disease, stage 5: Secondary | ICD-10-CM

## 2022-11-05 LAB — CBC
HCT: 36.1 % — ABNORMAL LOW (ref 39.0–52.0)
Hemoglobin: 11.4 g/dL — ABNORMAL LOW (ref 13.0–17.0)
MCH: 29.1 pg (ref 26.0–34.0)
MCHC: 31.6 g/dL (ref 30.0–36.0)
MCV: 92.1 fL (ref 80.0–100.0)
Platelets: 287 10*3/uL (ref 150–400)
RBC: 3.92 MIL/uL — ABNORMAL LOW (ref 4.22–5.81)
RDW: 13.4 % (ref 11.5–15.5)
WBC: 8 10*3/uL (ref 4.0–10.5)
nRBC: 0 % (ref 0.0–0.2)

## 2022-11-05 NOTE — Progress Notes (Signed)
No injection tpday per order parameters.  HGB 11.4.

## 2022-11-20 DIAGNOSIS — I5032 Chronic diastolic (congestive) heart failure: Secondary | ICD-10-CM | POA: Diagnosis not present

## 2022-11-20 DIAGNOSIS — I10 Essential (primary) hypertension: Secondary | ICD-10-CM | POA: Diagnosis not present

## 2022-11-20 DIAGNOSIS — E1122 Type 2 diabetes mellitus with diabetic chronic kidney disease: Secondary | ICD-10-CM | POA: Diagnosis not present

## 2022-11-26 ENCOUNTER — Inpatient Hospital Stay: Payer: Medicare Other | Attending: Hematology

## 2022-11-26 DIAGNOSIS — D631 Anemia in chronic kidney disease: Secondary | ICD-10-CM | POA: Insufficient documentation

## 2022-11-26 DIAGNOSIS — E538 Deficiency of other specified B group vitamins: Secondary | ICD-10-CM | POA: Diagnosis not present

## 2022-11-26 DIAGNOSIS — D509 Iron deficiency anemia, unspecified: Secondary | ICD-10-CM

## 2022-11-26 DIAGNOSIS — N185 Chronic kidney disease, stage 5: Secondary | ICD-10-CM | POA: Diagnosis not present

## 2022-11-26 LAB — COMPREHENSIVE METABOLIC PANEL
ALT: 13 U/L (ref 0–44)
AST: 16 U/L (ref 15–41)
Albumin: 3.7 g/dL (ref 3.5–5.0)
Alkaline Phosphatase: 70 U/L (ref 38–126)
Anion gap: 13 (ref 5–15)
BUN: 52 mg/dL — ABNORMAL HIGH (ref 8–23)
CO2: 21 mmol/L — ABNORMAL LOW (ref 22–32)
Calcium: 8.4 mg/dL — ABNORMAL LOW (ref 8.9–10.3)
Chloride: 107 mmol/L (ref 98–111)
Creatinine, Ser: 4.86 mg/dL — ABNORMAL HIGH (ref 0.61–1.24)
GFR, Estimated: 11 mL/min — ABNORMAL LOW (ref >60)
Glucose, Bld: 156 mg/dL — ABNORMAL HIGH (ref 70–99)
Potassium: 4.3 mmol/L (ref 3.5–5.1)
Sodium: 141 mmol/L (ref 135–145)
Total Bilirubin: 0.7 mg/dL (ref 0.3–1.2)
Total Protein: 7.9 g/dL (ref 6.5–8.1)

## 2022-11-26 LAB — CBC WITH DIFFERENTIAL/PLATELET
Abs Immature Granulocytes: 0.04 10*3/uL (ref 0.00–0.07)
Basophils Absolute: 0 10*3/uL (ref 0.0–0.1)
Basophils Relative: 1 %
Eosinophils Absolute: 0.4 10*3/uL (ref 0.0–0.5)
Eosinophils Relative: 5 %
HCT: 33.5 % — ABNORMAL LOW (ref 39.0–52.0)
Hemoglobin: 10.8 g/dL — ABNORMAL LOW (ref 13.0–17.0)
Immature Granulocytes: 1 %
Lymphocytes Relative: 21 %
Lymphs Abs: 1.8 10*3/uL (ref 0.7–4.0)
MCH: 29.8 pg (ref 26.0–34.0)
MCHC: 32.2 g/dL (ref 30.0–36.0)
MCV: 92.5 fL (ref 80.0–100.0)
Monocytes Absolute: 0.4 10*3/uL (ref 0.1–1.0)
Monocytes Relative: 5 %
Neutro Abs: 5.8 10*3/uL (ref 1.7–7.7)
Neutrophils Relative %: 67 %
Platelets: 278 10*3/uL (ref 150–400)
RBC: 3.62 MIL/uL — ABNORMAL LOW (ref 4.22–5.81)
RDW: 13.9 % (ref 11.5–15.5)
WBC: 8.5 10*3/uL (ref 4.0–10.5)
nRBC: 0 % (ref 0.0–0.2)

## 2022-11-26 LAB — IRON AND TIBC
Iron: 65 ug/dL (ref 45–182)
Saturation Ratios: 23 % (ref 17.9–39.5)
TIBC: 280 ug/dL (ref 250–450)
UIBC: 215 ug/dL

## 2022-11-26 LAB — FERRITIN: Ferritin: 295 ng/mL (ref 24–336)

## 2022-11-26 LAB — VITAMIN B12: Vitamin B-12: 1498 pg/mL — ABNORMAL HIGH (ref 180–914)

## 2022-11-30 LAB — METHYLMALONIC ACID, SERUM: Methylmalonic Acid, Quantitative: 198 nmol/L (ref 0–378)

## 2022-12-02 ENCOUNTER — Inpatient Hospital Stay: Payer: Medicare Other | Admitting: Physician Assistant

## 2022-12-02 ENCOUNTER — Inpatient Hospital Stay: Payer: Medicare Other

## 2022-12-02 VITALS — BP 139/64 | HR 60 | Temp 98.6°F | Resp 18 | Wt 221.8 lb

## 2022-12-02 DIAGNOSIS — E538 Deficiency of other specified B group vitamins: Secondary | ICD-10-CM

## 2022-12-02 DIAGNOSIS — D509 Iron deficiency anemia, unspecified: Secondary | ICD-10-CM | POA: Diagnosis not present

## 2022-12-02 DIAGNOSIS — D631 Anemia in chronic kidney disease: Secondary | ICD-10-CM

## 2022-12-02 DIAGNOSIS — N185 Chronic kidney disease, stage 5: Secondary | ICD-10-CM

## 2022-12-02 MED ORDER — EPOETIN ALFA-EPBX 10000 UNIT/ML IJ SOLN
10000.0000 [IU] | Freq: Once | INTRAMUSCULAR | Status: AC
  Administered 2022-12-02: 10000 [IU] via SUBCUTANEOUS
  Filled 2022-12-02: qty 1

## 2022-12-02 NOTE — Progress Notes (Signed)
[facility] [address], [place] [address]   CLINIC:  Medical Oncology/Hematology  PCP:  Richardean Chimera, MD [address] [place] [address] [phone]   REASON FOR VISIT:  Follow-up for normocytic anemia  CURRENT THERAPY: IV iron, B12, Rectrit  INTERVAL HISTORY:   Mr. Devin [name] 77 y.o. male returns for routine follow-up of normocytic anemia.  He was last seen by Rojelio Brenner PA-C on 09/10/2022.    He has not yet needed to start dialysis.  He has been tolerating Retacrit injections well without any symptoms of DVT/PE or significant increases in blood pressure.  (BP today is 142/61, which is about his usual baseline.)  He continues to deny any rectal bleeding, melena, epistaxis, or other sources of blood loss.  He denies any abnormal fatigue and reports that his energy is improving.  He denies any abnormal headaches, chest pain, dyspnea, lightheadedness, or syncope.  He has 70% energy and 100% appetite. He endorses that he is maintaining a stable weight.  ASSESSMENT & PLAN:  1.  Normocytic anemia, secondary to ESRD and iron deficiency - Patient has required intermittent IV iron (received 1 g parenteral iron at [facility] in June 2023).  Most recently received Feraheme x 1 on 03/25/2022. - Stool cards positive for blood.  EGD/colonoscopy on 05/26/2022 revealed gastric polyps with stigmata of recent bleeding, colon polyps, nonbleeding internal hemorrhoids, and diverticulosis - He follows with Dr. Wolfgang Phoenix for CKD stage V / ESRD. - ESA therapy with Retacrit initiated on 07/23/2022.  Current dose is Retacrit 10,000 units every 4 weeks.  He has not required Retacrit injections since June 2024. - Immunofixation shows polyclonal increase in immunoglobulins.  Light chain ratio elevated at 2.73 (kappa 216.5, lambda 79.2).  SPEP negative for M spike in December 2023. - Most recent labs (11/26/2022): Hgb 10.8 ferritin 295, iron saturation 23%.  Creatinine 4.86/GFR 11 - Denies  any bleeding per rectum or melena.  Denies any ice pica. - DIFFERENTIAL DIAGNOSIS favors combination anemia from CKD, relative iron deficiency, and B12 deficiency - PLAN: Continue Retacrit 10,000 units, but decreased frequency to every 6 weeks - Repeat CBC, CMP, ferritin, iron/TIBC with office visit in 3 months   2.  B12 deficiency: - Labs from 11/28/2021 show low vitamin B12 at 177, with MMA elevated at 562 - Patient is taking vitamin B12 1000 mcg daily - Most recent labs (05/12/2022) show improved vitamin B12 at 1498, MMA normal.   - PLAN: Recommend cutting back on vitamin B12 to 1000 mcg EVERY OTHER DAY.    Recheck B12/MMA in 6 to 12 months  3.  CKD stage V - Follows with Dr. Wolfgang Phoenix - Left brachiocephalic AV fistula placed on 2/95/6213 - He has progressive renal insufficiency, but not yet on dialysis   4.  Social/family history: - Other PMH: Hypertension, diabetes, arthritis, CKD stage V - He is a retired Producer, television/film/video and is independent of ADLs and IADLs.  Quit smoking cigarettes 30 years ago. - Father had prostate cancer.   PLAN SUMMARY: >> CBC + Retacrit every 6 weeks  >> Same-day labs (CBC/D, CMP, ferritin, iron/TIBC) + injection + OFFICE visit in 3 months      REVIEW OF SYSTEMS:   Review of Systems  Constitutional:  Negative for appetite change, chills, diaphoresis, fatigue, fever and unexpected weight change.  HENT:   Negative for lump/mass and nosebleeds.   Eyes:  Negative for eye problems.  Respiratory:  Negative for cough, hemoptysis  and shortness of breath.   Cardiovascular:  Negative for chest pain, leg swelling and palpitations.  Gastrointestinal:  Negative for abdominal pain, blood in stool, constipation, diarrhea, nausea and vomiting.  Genitourinary:  Negative for hematuria.   Skin: Negative.   Neurological:  Negative for dizziness, headaches and light-headedness.  Hematological:  Does not bruise/bleed easily.     PHYSICAL EXAM:  ECOG PERFORMANCE  STATUS: 0 - Asymptomatic  Vitals:   12/02/22 1326  BP: (!) 142/61  Pulse: 60  Resp: 18  Temp: 98.6 F (37 C)  SpO2: 100%   Filed Weights   12/02/22 1326  Weight: 221 lb 12.5 oz (100.6 kg)   Physical Exam Constitutional:      Appearance: Normal appearance. He is obese.  Cardiovascular:     Heart sounds: Normal heart sounds.  Pulmonary:     Breath sounds: Normal breath sounds.  Neurological:     General: No focal deficit present.     Mental Status: Mental status is at baseline.  Psychiatric:        Behavior: Behavior normal. Behavior is cooperative.     PAST MEDICAL/SURGICAL HISTORY:  Past Medical History:  Diagnosis Date   Arthritis    CKD (chronic kidney disease) stage 4, GFR 15-29 ml/min (HCC)    Diabetes mellitus without complication (HCC)    Gout    Hypertension    Past Surgical History:  Procedure Laterality Date   AV FISTULA PLACEMENT Left 12/16/2021   Procedure: LEFT ARM ARTERIOVENOUS (AV) FISTULA CREATION;  Surgeon: Larina Earthly, MD;  Location: [facility];  Service: Vascular;  Laterality: Left;   BIOPSY  05/26/2022   Procedure: BIOPSY;  Surgeon: Dolores Frame, MD;  Location: [facility];  Service: Gastroenterology;;   COLONOSCOPY WITH PROPOFOL N/A 05/26/2022   Procedure: COLONOSCOPY WITH PROPOFOL;  Surgeon: Dolores Frame, MD;  Location: [facility];  Service: Gastroenterology;  Laterality: N/A;  200pm, asa 3   ESOPHAGOGASTRODUODENOSCOPY (EGD) WITH PROPOFOL N/A 05/26/2022   Procedure: ESOPHAGOGASTRODUODENOSCOPY (EGD) WITH PROPOFOL;  Surgeon: Dolores Frame, MD;  Location: [facility];  Service: Gastroenterology;  Laterality: N/A;   HEMOSTASIS CLIP PLACEMENT  05/26/2022   Procedure: HEMOSTASIS CLIP PLACEMENT;  Surgeon: Dolores Frame, MD;  Location: [facility];  Service: Gastroenterology;;   KNEE SURGERY     POLYPECTOMY  05/26/2022   Procedure: POLYPECTOMY;  Surgeon: Marguerita Merles, Reuel Boom, MD;  Location: [facility];  Service: Gastroenterology;;   TONSILLECTOMY      SOCIAL HISTORY:  Social History   Socioeconomic History   Marital status: Married    Spouse name: Not on file   Number of children: Not on file   Years of education: Not on file   Highest education level: Not on file  Occupational History   Not on file  Tobacco Use   Smoking status: Former    Current packs/day: 0.00    Types: Cigarettes    Start date: 39    Quit date: 1993    Years since quitting: 31.7    Passive exposure: Past   Smokeless tobacco: Never  Vaping Use   Vaping status: Never Used  Substance and Sexual Activity   Alcohol use: Never   Drug use: Never   Sexual activity: Yes  Other Topics Concern   Not on file  Social History Narrative   Not on file   Social Determinants of Health   Financial Resource Strain: Not on file  Food Insecurity: Not on file  Transportation Needs: Not on file  Physical Activity: Not on file  Stress: Not on file  Social Connections: Not on file  Intimate Partner Violence: Not on file    FAMILY HISTORY:  Family History  Problem Relation Age of Onset   Cancer Father    Stroke Sister    Kidney disease Sister    Heart disease Brother     CURRENT MEDICATIONS:  Outpatient Encounter Medications as of 12/02/2022  Medication Sig   amLODipine (NORVASC) 10 MG tablet Take 10 mg by mouth daily.   aspirin EC 81 MG tablet Take 81 mg by mouth daily. Swallow whole.   carvedilol (COREG) 12.5 MG tablet Take 12.5 mg by mouth 2 (two) times daily with a meal.   cholecalciferol (VITAMIN D3) 25 MCG (1000 UNIT) tablet Take 1,000 Units by mouth daily.   cloNIDine (CATAPRES) 0.3 MG tablet Take 0.3 mg by mouth in the morning, at noon, and at bedtime.   Cyanocobalamin 1000 MCG SUBL Place 1,000 mcg under the tongue daily.   epoetin alfa (EPOGEN) 3000 UNIT/ML injection Inject into the skin.   ferrous sulfate 325 (65 FE) MG tablet Take 1 tablet (325 mg total) by mouth 2 (two) times daily with  a meal. (Patient taking differently: Take 325 mg by mouth daily.)   furosemide (LASIX) 40 MG tablet Take 40 mg by mouth 2 (two) times daily.   hydrALAZINE (APRESOLINE) 100 MG tablet Take 100 mg by mouth in the morning, at noon, and at bedtime.   pantoprazole (PROTONIX) 40 MG tablet Take 1 tablet (40 mg total) by mouth 2 (two) times daily for 60 days, THEN 1 tablet (40 mg total) daily.   simvastatin (ZOCOR) 20 MG tablet Take 20 mg by mouth daily.   No facility-administered encounter medications on file as of 12/02/2022.    ALLERGIES:  No Known Allergies  LABORATORY DATA:  I have reviewed the labs as listed.  CBC    Component Value Date/Time   WBC 8.5 11/26/2022 1207   RBC 3.62 (L) 11/26/2022 1207   HGB 10.8 (L) 11/26/2022 1207   HCT 33.5 (L) 11/26/2022 1207   PLT 278 11/26/2022 1207   MCV 92.5 11/26/2022 1207   MCH 29.8 11/26/2022 1207   MCHC 32.2 11/26/2022 1207   RDW 13.9 11/26/2022 1207   LYMPHSABS 1.8 11/26/2022 1207   MONOABS 0.4 11/26/2022 1207   EOSABS 0.4 11/26/2022 1207   BASOSABS 0.0 11/26/2022 1207      Latest Ref Rng & Units 11/26/2022   12:07 PM 08/25/2022    9:19 AM 05/22/2022   10:58 AM  CMP  Glucose 70 - 99 mg/dL 595  638  756   BUN 8 - 23 mg/dL 52  50  56   Creatinine 0.61 - 1.24 mg/dL 4.33  2.95  1.88   Sodium 135 - 145 mmol/L 141  138  135   Potassium 3.5 - 5.1 mmol/L 4.3  4.1  3.8   Chloride 98 - 111 mmol/L 107  104  106   CO2 22 - 32 mmol/L 21  23  22    Calcium 8.9 - 10.3 mg/dL 8.4  8.9  8.4   Total Protein 6.5 - 8.1 g/dL 7.9  8.8    Total Bilirubin 0.3 - 1.2 mg/dL 0.7  0.4    Alkaline Phos 38 - 126 U/L 70  74    AST 15 - 41 U/L 16  19    ALT 0 - 44 U/L 13  13  DIAGNOSTIC IMAGING:  I have independently reviewed the relevant imaging and discussed with the patient.   WRAP UP:  All questions were answered. The patient knows to call the clinic with any problems, questions or concerns.  Medical decision making: Low  Time spent on visit: I spent  15 minutes counseling the patient face to face. The total time spent in the appointment was 22 minutes and more than 50% was on counseling.  Carnella Guadalajara, PA-C  12/02/22 1:45 PM

## 2022-12-02 NOTE — Patient Instructions (Signed)
[facility]  Discharge Instructions: Thank you for choosing [facility] to provide your oncology and hematology care.  If you have a lab appointment with the Cancer Center - please note that after April 8th, 2024, all labs will be drawn in the cancer center.  You do not have to check in or register with the main entrance as you have in the past but will complete your check-in in the cancer center.  Wear comfortable clothing and clothing appropriate for easy access to any Portacath or PICC line.   We strive to give you quality time with your provider. You may need to reschedule your appointment if you arrive late (15 or more minutes).  Arriving late affects you and other patients whose appointments are after yours.  Also, if you miss three or more appointments without notifying the office, you may be dismissed from the clinic at the provider's discretion.      For prescription refill requests, have your pharmacy contact our office and allow 72 hours for refills to be completed.    Today you received Retacrit 10,000U     BELOW ARE SYMPTOMS THAT SHOULD BE REPORTED IMMEDIATELY: *FEVER GREATER THAN 100.4 F (38 C) OR HIGHER *CHILLS OR SWEATING *NAUSEA AND VOMITING THAT IS NOT CONTROLLED WITH YOUR NAUSEA MEDICATION *UNUSUAL SHORTNESS OF BREATH *UNUSUAL BRUISING OR BLEEDING *URINARY PROBLEMS (pain or burning when urinating, or frequent urination) *BOWEL PROBLEMS (unusual diarrhea, constipation, pain near the anus) TENDERNESS IN MOUTH AND THROAT WITH OR WITHOUT PRESENCE OF ULCERS (sore throat, sores in mouth, or a toothache) UNUSUAL RASH, SWELLING OR PAIN  UNUSUAL VAGINAL DISCHARGE OR ITCHING   Items with * indicate a potential emergency and should be followed up as soon as possible or go to the Emergency Department if any problems should occur.  Please show the CHEMOTHERAPY ALERT CARD or IMMUNOTHERAPY ALERT CARD at check-in to the Emergency Department and  triage nurse.  Should you have questions after your visit or need to cancel or reschedule your appointment, please contact [facility] [phone]  and follow the prompts.  Office hours are 8:00 a.m. to 4:30 p.m. Monday - Friday. Please note that voicemails left after 4:00 p.m. may not be returned until the following business day.  We are closed weekends and major holidays. You have access to a nurse at all times for urgent questions. Please call the main number to the clinic [phone] and follow the prompts.  For any non-urgent questions, you may also contact your provider using MyChart. We now offer e-Visits for anyone 65 and older to request care online for non-urgent symptoms. For details visit [email or web address].   Also download the MyChart app! Go to the app store, search "MyChart", open the app, select [place], and log in with your MyChart username and password.

## 2022-12-02 NOTE — Patient Instructions (Addendum)
[facility] at [facility] **VISIT SUMMARY & IMPORTANT INSTRUCTIONS **   You were seen today by Rojelio Brenner PA-C for your anemia.   Your chronic kidney disease is the main cause of your anemia. Your blood levels (hemoglobin) look much better after you started Retacrit shots. Since your blood counts are improved, we will switch your injections so that you only get the RETACRIT INJECTION once every 6 weeks (instead of every 4 weeks). I would like you to DECREASE your Vitamin B12 so that you are taking it EVERY OTHER DAY (instead of daily.)  FOLLOW-UP APPOINTMENT: Labs and follow-up visit in 3 months  ** Thank you for trusting me with your healthcare!  I strive to provide all of my patients with quality care at each visit.  If you receive a survey for this visit, I would be so grateful to you for taking the time to provide feedback.  Thank you in advance!  ~ Tonya Carlile                   Dr. Doreatha Massed   &   Rojelio Brenner, PA-C   - - - - - - - - - - - - - - - - - -    Thank you for choosing [facility] at [facility] to provide your oncology and hematology care.  To afford each patient quality time with our provider, please arrive at least 15 minutes before your scheduled appointment time.   If you have a lab appointment with the Cancer Center please come in thru the Main Entrance and check in at the main information desk.  You need to re-schedule your appointment should you arrive 10 or more minutes late.  We strive to give you quality time with our providers, and arriving late affects you and other patients whose appointments are after yours.  Also, if you no show three or more times for appointments you may be dismissed from the clinic at the providers discretion.     Again, thank you for choosing [facility].  Our hope is that these requests will decrease the amount of time that you wait before being seen by our  physicians.       _____________________________________________________________  Should you have questions after your visit to [facility], please contact our office at [phone] and follow the prompts.  Our office hours are 8:00 a.m. and 4:30 p.m. Monday - Friday.  Please note that voicemails left after 4:00 p.m. may not be returned until the following business day.  We are closed weekends and major holidays.  You do have access to a nurse 24-7, just call the main number to the clinic [phone] and do not press any options, hold on the line and a nurse will answer the phone.    For prescription refill requests, have your pharmacy contact our office and allow 72 hours.

## 2022-12-02 NOTE — Progress Notes (Signed)
Christpher Loeber presents today for injection per the provider's orders.  Retacrit 10,000U administration without incident; injection site WNL; see MAR for injection details.  Patient tolerated procedure well and without incident.  No questions or complaints noted at this time.   Discharged from clinic ambulatory with cane in stable condition. Alert and oriented x 3. F/U with [facility] as scheduled.

## 2022-12-11 DIAGNOSIS — E1129 Type 2 diabetes mellitus with other diabetic kidney complication: Secondary | ICD-10-CM | POA: Diagnosis not present

## 2022-12-11 DIAGNOSIS — N185 Chronic kidney disease, stage 5: Secondary | ICD-10-CM | POA: Diagnosis not present

## 2022-12-11 DIAGNOSIS — E1122 Type 2 diabetes mellitus with diabetic chronic kidney disease: Secondary | ICD-10-CM | POA: Diagnosis not present

## 2022-12-11 DIAGNOSIS — R809 Proteinuria, unspecified: Secondary | ICD-10-CM | POA: Diagnosis not present

## 2022-12-14 DIAGNOSIS — E1122 Type 2 diabetes mellitus with diabetic chronic kidney disease: Secondary | ICD-10-CM | POA: Diagnosis not present

## 2022-12-14 DIAGNOSIS — E7849 Other hyperlipidemia: Secondary | ICD-10-CM | POA: Diagnosis not present

## 2022-12-14 DIAGNOSIS — K219 Gastro-esophageal reflux disease without esophagitis: Secondary | ICD-10-CM | POA: Diagnosis not present

## 2022-12-14 DIAGNOSIS — E1129 Type 2 diabetes mellitus with other diabetic kidney complication: Secondary | ICD-10-CM | POA: Diagnosis not present

## 2022-12-14 DIAGNOSIS — E1165 Type 2 diabetes mellitus with hyperglycemia: Secondary | ICD-10-CM | POA: Diagnosis not present

## 2022-12-14 DIAGNOSIS — N189 Chronic kidney disease, unspecified: Secondary | ICD-10-CM | POA: Diagnosis not present

## 2022-12-14 DIAGNOSIS — E211 Secondary hyperparathyroidism, not elsewhere classified: Secondary | ICD-10-CM | POA: Diagnosis not present

## 2022-12-18 DIAGNOSIS — I10 Essential (primary) hypertension: Secondary | ICD-10-CM | POA: Diagnosis not present

## 2022-12-18 DIAGNOSIS — D631 Anemia in chronic kidney disease: Secondary | ICD-10-CM | POA: Diagnosis not present

## 2022-12-18 DIAGNOSIS — D519 Vitamin B12 deficiency anemia, unspecified: Secondary | ICD-10-CM | POA: Diagnosis not present

## 2022-12-18 DIAGNOSIS — I5032 Chronic diastolic (congestive) heart failure: Secondary | ICD-10-CM | POA: Diagnosis not present

## 2022-12-21 DIAGNOSIS — R809 Proteinuria, unspecified: Secondary | ICD-10-CM | POA: Diagnosis not present

## 2022-12-21 DIAGNOSIS — E559 Vitamin D deficiency, unspecified: Secondary | ICD-10-CM | POA: Diagnosis not present

## 2022-12-21 DIAGNOSIS — I10 Essential (primary) hypertension: Secondary | ICD-10-CM | POA: Diagnosis not present

## 2022-12-21 DIAGNOSIS — Z23 Encounter for immunization: Secondary | ICD-10-CM | POA: Diagnosis not present

## 2022-12-21 DIAGNOSIS — E7849 Other hyperlipidemia: Secondary | ICD-10-CM | POA: Diagnosis not present

## 2022-12-21 DIAGNOSIS — E538 Deficiency of other specified B group vitamins: Secondary | ICD-10-CM | POA: Diagnosis not present

## 2022-12-21 DIAGNOSIS — D519 Vitamin B12 deficiency anemia, unspecified: Secondary | ICD-10-CM | POA: Diagnosis not present

## 2022-12-21 DIAGNOSIS — N184 Chronic kidney disease, stage 4 (severe): Secondary | ICD-10-CM | POA: Diagnosis not present

## 2022-12-21 DIAGNOSIS — N2581 Secondary hyperparathyroidism of renal origin: Secondary | ICD-10-CM | POA: Diagnosis not present

## 2022-12-21 DIAGNOSIS — E1122 Type 2 diabetes mellitus with diabetic chronic kidney disease: Secondary | ICD-10-CM | POA: Diagnosis not present

## 2023-01-08 DIAGNOSIS — E559 Vitamin D deficiency, unspecified: Secondary | ICD-10-CM | POA: Diagnosis not present

## 2023-01-08 DIAGNOSIS — D649 Anemia, unspecified: Secondary | ICD-10-CM | POA: Diagnosis not present

## 2023-01-08 DIAGNOSIS — R5383 Other fatigue: Secondary | ICD-10-CM | POA: Diagnosis not present

## 2023-01-08 DIAGNOSIS — N183 Chronic kidney disease, stage 3 unspecified: Secondary | ICD-10-CM | POA: Diagnosis not present

## 2023-01-08 DIAGNOSIS — E1122 Type 2 diabetes mellitus with diabetic chronic kidney disease: Secondary | ICD-10-CM | POA: Diagnosis not present

## 2023-01-13 ENCOUNTER — Inpatient Hospital Stay: Payer: Medicare Other

## 2023-01-13 ENCOUNTER — Inpatient Hospital Stay: Payer: Medicare Other | Attending: Hematology

## 2023-01-13 VITALS — BP 165/74 | HR 64 | Temp 97.9°F | Resp 18

## 2023-01-13 DIAGNOSIS — D631 Anemia in chronic kidney disease: Secondary | ICD-10-CM | POA: Insufficient documentation

## 2023-01-13 DIAGNOSIS — N185 Chronic kidney disease, stage 5: Secondary | ICD-10-CM | POA: Insufficient documentation

## 2023-01-13 DIAGNOSIS — E538 Deficiency of other specified B group vitamins: Secondary | ICD-10-CM | POA: Diagnosis not present

## 2023-01-13 DIAGNOSIS — D509 Iron deficiency anemia, unspecified: Secondary | ICD-10-CM

## 2023-01-13 LAB — CBC
HCT: 34.1 % — ABNORMAL LOW (ref 39.0–52.0)
Hemoglobin: 10.7 g/dL — ABNORMAL LOW (ref 13.0–17.0)
MCH: 30.1 pg (ref 26.0–34.0)
MCHC: 31.4 g/dL (ref 30.0–36.0)
MCV: 96.1 fL (ref 80.0–100.0)
Platelets: 245 10*3/uL (ref 150–400)
RBC: 3.55 MIL/uL — ABNORMAL LOW (ref 4.22–5.81)
RDW: 13.6 % (ref 11.5–15.5)
WBC: 9.6 10*3/uL (ref 4.0–10.5)
nRBC: 0 % (ref 0.0–0.2)

## 2023-01-13 MED ORDER — EPOETIN ALFA-EPBX 10000 UNIT/ML IJ SOLN
10000.0000 [IU] | Freq: Once | INTRAMUSCULAR | Status: AC
  Administered 2023-01-13: 10000 [IU] via SUBCUTANEOUS
  Filled 2023-01-13: qty 1

## 2023-01-13 NOTE — Patient Instructions (Signed)
[facility]  Discharge Instructions: Thank you for choosing [facility] to provide your oncology and hematology care.  If you have a lab appointment with the Cancer Center - please note that after April 8th, 2024, all labs will be drawn in the cancer center.  You do not have to check in or register with the main entrance as you have in the past but will complete your check-in in the cancer center.  Wear comfortable clothing and clothing appropriate for easy access to any Portacath or PICC line.   We strive to give you quality time with your provider. You may need to reschedule your appointment if you arrive late (15 or more minutes).  Arriving late affects you and other patients whose appointments are after yours.  Also, if you miss three or more appointments without notifying the office, you may be dismissed from the clinic at the provider's discretion.      For prescription refill requests, have your pharmacy contact our office and allow 72 hours for refills to be completed.    Today you received the following injection:Retacrit   To help prevent nausea and vomiting after your treatment, we encourage you to take your nausea medication as directed.  BELOW ARE SYMPTOMS THAT SHOULD BE REPORTED IMMEDIATELY: *FEVER GREATER THAN 100.4 F (38 C) OR HIGHER *CHILLS OR SWEATING *NAUSEA AND VOMITING THAT IS NOT CONTROLLED WITH YOUR NAUSEA MEDICATION *UNUSUAL SHORTNESS OF BREATH *UNUSUAL BRUISING OR BLEEDING *URINARY PROBLEMS (pain or burning when urinating, or frequent urination) *BOWEL PROBLEMS (unusual diarrhea, constipation, pain near the anus) TENDERNESS IN MOUTH AND THROAT WITH OR WITHOUT PRESENCE OF ULCERS (sore throat, sores in mouth, or a toothache) UNUSUAL RASH, SWELLING OR PAIN  UNUSUAL VAGINAL DISCHARGE OR ITCHING   Items with * indicate a potential emergency and should be followed up as soon as possible or go to the Emergency Department if any problems  should occur.  Please show the CHEMOTHERAPY ALERT CARD or IMMUNOTHERAPY ALERT CARD at check-in to the Emergency Department and triage nurse.  Should you have questions after your visit or need to cancel or reschedule your appointment, please contact [facility] [phone]  and follow the prompts.  Office hours are 8:00 a.m. to 4:30 p.m. Monday - Friday. Please note that voicemails left after 4:00 p.m. may not be returned until the following business day.  We are closed weekends and major holidays. You have access to a nurse at all times for urgent questions. Please call the main number to the clinic [phone] and follow the prompts.  For any non-urgent questions, you may also contact your provider using MyChart. We now offer e-Visits for anyone 64 and older to request care online for non-urgent symptoms. For details visit [email or web address].   Also download the MyChart app! Go to the app store, search "MyChart", open the app, select , and log in with your MyChart username and password.

## 2023-01-19 ENCOUNTER — Ambulatory Visit: Payer: Medicare Other | Admitting: Urology

## 2023-01-19 ENCOUNTER — Encounter: Payer: Self-pay | Admitting: Urology

## 2023-01-19 VITALS — BP 137/59 | HR 56

## 2023-01-19 DIAGNOSIS — N433 Hydrocele, unspecified: Secondary | ICD-10-CM

## 2023-01-19 LAB — MICROSCOPIC EXAMINATION
Bacteria, UA: NONE SEEN
RBC, Urine: NONE SEEN /[HPF] (ref 0–2)

## 2023-01-19 LAB — URINALYSIS, ROUTINE W REFLEX MICROSCOPIC
Bilirubin, UA: NEGATIVE
Glucose, UA: NEGATIVE
Ketones, UA: NEGATIVE
Leukocytes,UA: NEGATIVE
Nitrite, UA: NEGATIVE
RBC, UA: NEGATIVE
Specific Gravity, UA: 1.015 (ref 1.005–1.030)
Urobilinogen, Ur: 0.2 mg/dL (ref 0.2–1.0)
pH, UA: 7 (ref 5.0–7.5)

## 2023-01-19 NOTE — Progress Notes (Signed)
H&P  Chief Complaint: Scrotal swelling  History of Present Illness: 77 [facility].  Seen a few years ago by Dr. Annabell Howells for bladder abnormalities.  He had cystoscopy which revealed no abnormalities.  He at that time was found to have a right hydrocele but did not feel like he needed management.  He has had increased size and difficulty with this hydrocele.  He like to consider management.  He has not had an ultrasound performed.  No significant urologic symptoms regarding urination.  Past Medical History:  Diagnosis Date   Arthritis    CKD (chronic kidney disease) stage 4, GFR 15-29 ml/min (HCC)    Diabetes mellitus without complication (HCC)    Gout    Hypertension     Past Surgical History:  Procedure Laterality Date   AV FISTULA PLACEMENT Left 12/16/2021   Procedure: LEFT ARM ARTERIOVENOUS (AV) FISTULA CREATION;  Surgeon: Larina Earthly, MD;  [facility]: [facility];  Service: Vascular;  Laterality: Left;   BIOPSY  05/26/2022   Procedure: BIOPSY;  Surgeon: Dolores Frame, MD;  [facility];  Service: Gastroenterology;;   COLONOSCOPY WITH PROPOFOL N/A 05/26/2022   Procedure: COLONOSCOPY WITH PROPOFOL;  Surgeon: Dolores Frame, MD;  [facility];  Service: Gastroenterology;  Laterality: N/A;  200pm, asa 3   ESOPHAGOGASTRODUODENOSCOPY (EGD) WITH PROPOFOL N/A 05/26/2022   Procedure: ESOPHAGOGASTRODUODENOSCOPY (EGD) WITH PROPOFOL;  Surgeon: Dolores Frame, MD;  [facility];  Service: Gastroenterology;  Laterality: N/A;   HEMOSTASIS CLIP PLACEMENT  05/26/2022   Procedure: HEMOSTASIS CLIP PLACEMENT;  Surgeon: Dolores Frame, MD;  [facility];  Service: Gastroenterology;;   KNEE SURGERY     POLYPECTOMY  05/26/2022   Procedure: POLYPECTOMY;  Surgeon: Dolores Frame, MD;  [facility];  Service: Gastroenterology;;   TONSILLECTOMY       Home Medications:  Allergies as of 01/19/2023   No Known Allergies      Medication List        Accurate as of January 19, 2023  8:37 AM. If you have any questions, ask your nurse or doctor.          amLODipine 10 MG tablet Commonly known as: NORVASC Take 10 mg by mouth daily.   aspirin EC 81 MG tablet Take 81 mg by mouth daily. Swallow whole.   carvedilol 12.5 MG tablet Commonly known as: COREG Take 12.5 mg by mouth 2 (two) times daily with a meal.   cholecalciferol 25 MCG (1000 UNIT) tablet Commonly known as: VITAMIN D3 Take 1,000 Units by mouth daily.   cloNIDine 0.3 MG tablet Commonly known as: CATAPRES Take 0.3 mg by mouth in the morning, at noon, and at bedtime.   Cyanocobalamin 1000 MCG Subl Place 1,000 mcg under the tongue daily.   epoetin alfa 3000 UNIT/ML injection Commonly known as: EPOGEN Inject into the skin.   ferrous sulfate 325 (65 FE) MG tablet Take 1 tablet (325 mg total) by mouth 2 (two) times daily with a meal. What changed: when to take this   furosemide 40 MG tablet Commonly known as: LASIX Take 40 mg by mouth 2 (two) times daily.   hydrALAZINE 100 MG tablet Commonly known as: APRESOLINE Take 100 mg by mouth in the morning, at noon, and at bedtime.   pantoprazole 40 MG tablet Commonly known as: PROTONIX Take 1 tablet (40 mg total) by mouth 2 (two) times daily for  60 days, THEN 1 tablet (40 mg total) daily. Start taking on: May 26, 2022   simvastatin 20 MG tablet Commonly known as: ZOCOR Take 20 mg by mouth daily.        Allergies: No Known Allergies  Family History  Problem Relation Age of Onset   Cancer Father    Stroke Sister    Kidney disease Sister    Heart disease Brother     Social History:  reports that he quit smoking about 31 years ago. His smoking use included cigarettes. He started smoking about 51 years ago. He has been exposed to tobacco smoke. He has never used smokeless tobacco. He reports that he  does not drink alcohol and does not use drugs.  ROS: A complete review of systems was performed.  All systems are negative except for pertinent findings as noted.  Physical Exam:  Vital signs in last 24 hours: There were no vitals taken for this visit. Constitutional:  Alert and oriented, No acute distress Cardiovascular: Regular rate  Respiratory: Normal respiratory effort GI: No inguinal hernias noted GU: Penis buried because of large right-sided hydrocele.  Right testicle nonpalpable, left normal. Lymphatic: No lymphadenopathy Neurologic: Grossly intact, no focal deficits Psychiatric: Normal mood and affect  I have reviewed prior pt notes--Dr. Belva Crome notes  I have reviewed notes from referring/previous physicians  I have reviewed urinalysis results--clear  I have independently reviewed prior imaging--prior renal ultrasound  I have reviewed IPSS sheet-score 9   Impression/Assessment:  Large right hydrocele, symptomatic  Plan:  I will send the patient out for scrotal ultrasound  Following that, if uncomplicated hydrocele, we will proceed with scheduling right hydrocelectomy.  He would prefer to do here in [place] with Dr. Ronne Binning

## 2023-01-21 DIAGNOSIS — N2581 Secondary hyperparathyroidism of renal origin: Secondary | ICD-10-CM | POA: Diagnosis not present

## 2023-01-22 DIAGNOSIS — E1129 Type 2 diabetes mellitus with other diabetic kidney complication: Secondary | ICD-10-CM | POA: Diagnosis not present

## 2023-01-22 DIAGNOSIS — R809 Proteinuria, unspecified: Secondary | ICD-10-CM | POA: Diagnosis not present

## 2023-01-22 DIAGNOSIS — N185 Chronic kidney disease, stage 5: Secondary | ICD-10-CM | POA: Diagnosis not present

## 2023-01-22 DIAGNOSIS — E1122 Type 2 diabetes mellitus with diabetic chronic kidney disease: Secondary | ICD-10-CM | POA: Diagnosis not present

## 2023-02-09 ENCOUNTER — Ambulatory Visit (HOSPITAL_COMMUNITY)
Admission: RE | Admit: 2023-02-09 | Discharge: 2023-02-09 | Disposition: A | Discharge status: Home / Self Care | Payer: Medicare Other | Source: Ambulatory Visit | Attending: Urology | Admitting: Urology

## 2023-02-09 DIAGNOSIS — N433 Hydrocele, unspecified: Secondary | ICD-10-CM | POA: Insufficient documentation

## 2023-02-16 ENCOUNTER — Telehealth: Payer: Self-pay

## 2023-02-16 DIAGNOSIS — N433 Hydrocele, unspecified: Secondary | ICD-10-CM

## 2023-02-16 NOTE — Telephone Encounter (Signed)
-----   Message from Bertram Millard Dahlstedt sent at 02/16/2023  8:56 AM EST ----- Please call patient-the ultrasound confirms that his swelling is a hydrocele that his testicle looks okay.  I will give you a posting sheet for Dr. Ronne Binning to perform the procedure here in [place].  I have let Dr. Ronne Binning know that we will be doing that. ----- Message ----- From: Interface, Rad Results In Sent: 02/13/2023   5:00 PM EST To: Marcine Matar, MD

## 2023-02-16 NOTE — Telephone Encounter (Signed)
Patient is aware of MD response and that surgery scheduler will be reaching out with a surgery date within the next 2 weeks.  Patient voiced understanding.

## 2023-02-23 NOTE — Progress Notes (Unsigned)
[facility] [address], [place] [address]   CLINIC:  Medical Oncology/Hematology  PCP:  Richardean Chimera, MD [address] [place] [address] [phone]   REASON FOR VISIT:  Follow-up for normocytic anemia  CURRENT THERAPY: IV iron, B12, Rectrit  INTERVAL HISTORY:   Devin [name] 77 y.o. male returns for routine follow-up of normocytic anemia.  He was last seen by Rojelio Brenner PA-C on 12/02/2022.    He has not yet needed to start dialysis. *** *** He has been tolerating Retacrit injections well without any symptoms of DVT/PE or significant increases in blood pressure.  (BP today is ***, which is about his usual baseline.)   ***He continues to deny any rectal bleeding, melena, epistaxis, or other sources of blood loss.   ***He denies any abnormal fatigue and reports that his energy is improving.   ***He denies any abnormal headaches, chest pain, dyspnea, lightheadedness, or syncope. *** B12 every other day (instead of daily)  He has 70***% energy and 100***% appetite. He endorses that he is maintaining a stable weight.  ASSESSMENT & PLAN:  1.  Normocytic anemia, secondary to ESRD and iron deficiency - Patient has required intermittent IV iron (received 1 g parenteral iron at [facility] in June 2023).  Most recently received Feraheme x 1 on 03/25/2022. - Stool cards positive for blood.  EGD/colonoscopy on 05/26/2022 revealed gastric polyps with stigmata of recent bleeding, colon polyps, nonbleeding internal hemorrhoids, and diverticulosis - He follows with Dr. Wolfgang Phoenix for CKD stage V / ESRD. - ESA therapy with Retacrit initiated on 07/23/2022.  Current dose is Retacrit 10,000 units every 4 weeks. *** - Immunofixation shows polyclonal increase in immunoglobulins.  Light chain ratio elevated at 2.73 (kappa 216.5, lambda 79.2).  SPEP negative for M spike in December 2023. - Most recent labs (02/24/2023): *** - Denies any bleeding per rectum or melena.  Denies any  ice pica.*** - DIFFERENTIAL DIAGNOSIS favors combination anemia from CKD, relative iron deficiency, and B12 deficiency - PLAN: Continue Retacrit 10,000 units, but decreased frequency to every 6 weeks - Repeat CBC, CMP, ferritin, iron/TIBC with office visit in 3 months   2.  B12 deficiency: - Labs from 11/28/2021 show low vitamin B12 at 177, with MMA elevated at 562 - Patient is taking vitamin B12 1000 mcg *** every other day*** - Most recent labs (05/12/2022) show improved vitamin B12 at 1498, MMA normal.   - PLAN: Recommend cutting back on vitamin B12 to 1000 mcg EVERY OTHER DAY.    Recheck B12/MMA in 6 to 12 months  3.  CKD stage V - Follows with Dr. Wolfgang Phoenix - Left brachiocephalic AV fistula placed on 06/13/4008 - He has progressive renal insufficiency, but not yet on dialysis***   4.  Social/family history: - Other PMH: Hypertension, diabetes, arthritis, CKD stage V - He is a retired Producer, television/film/video and is independent of ADLs and IADLs.  Quit smoking cigarettes 30 years ago. - Father had prostate cancer.   PLAN SUMMARY:***TBD *** >> CBC + Retacrit every 6 weeks  >> Same-day labs (CBC/D, CMP, ferritin, iron/TIBC) + injection + OFFICE visit in 3 months      REVIEW OF SYSTEMS:***  Review of Systems  Constitutional:  Negative for appetite change, chills, diaphoresis, fatigue, fever and unexpected weight change.  HENT:   Negative for lump/mass and nosebleeds.   Eyes:  Negative for eye problems.  Respiratory:  Negative for cough, hemoptysis and shortness of breath.  Cardiovascular:  Negative for chest pain, leg swelling and palpitations.  Gastrointestinal:  Negative for abdominal pain, blood in stool, constipation, diarrhea, nausea and vomiting.  Genitourinary:  Negative for hematuria.   Skin: Negative.   Neurological:  Negative for dizziness, headaches and light-headedness.  Hematological:  Does not bruise/bleed easily.     PHYSICAL EXAM:***  ECOG PERFORMANCE STATUS: 0 -  Asymptomatic  There were no vitals filed for this visit.  There were no vitals filed for this visit.  Physical Exam Constitutional:      Appearance: Normal appearance. He is obese.  Cardiovascular:     Heart sounds: Normal heart sounds.  Pulmonary:     Breath sounds: Normal breath sounds.  Neurological:     General: No focal deficit present.     Mental Status: Mental status is at baseline.  Psychiatric:        Behavior: Behavior normal. Behavior is cooperative.     PAST MEDICAL/SURGICAL HISTORY:  Past Medical History:  Diagnosis Date   Arthritis    CKD (chronic kidney disease) stage 4, GFR 15-29 ml/min (HCC)    Diabetes mellitus without complication (HCC)    Gout    Hypertension    Past Surgical History:  Procedure Laterality Date   AV FISTULA PLACEMENT Left 12/16/2021   Procedure: LEFT ARM ARTERIOVENOUS (AV) FISTULA CREATION;  Surgeon: Larina Earthly, MD;  Location: [facility];  Service: Vascular;  Laterality: Left;   BIOPSY  05/26/2022   Procedure: BIOPSY;  Surgeon: Dolores Frame, MD;  [facility];  Service: Gastroenterology;;   COLONOSCOPY WITH PROPOFOL N/A 05/26/2022   Procedure: COLONOSCOPY WITH PROPOFOL;  Surgeon: Dolores Frame, MD;  [facility];  Service: Gastroenterology;  Laterality: N/A;  200pm, asa 3   ESOPHAGOGASTRODUODENOSCOPY (EGD) WITH PROPOFOL N/A 05/26/2022   Procedure: ESOPHAGOGASTRODUODENOSCOPY (EGD) WITH PROPOFOL;  Surgeon: Dolores Frame, MD;  [facility];  Service: Gastroenterology;  Laterality: N/A;   HEMOSTASIS CLIP PLACEMENT  05/26/2022   Procedure: HEMOSTASIS CLIP PLACEMENT;  Surgeon: Dolores Frame, MD;  [facility];  Service: Gastroenterology;;   KNEE SURGERY     POLYPECTOMY  05/26/2022   Procedure: POLYPECTOMY;  Surgeon: Marguerita Merles, Reuel Boom, MD;  [facility];  Service: Gastroenterology;;   TONSILLECTOMY      SOCIAL HISTORY:  Social History    Socioeconomic History   Marital status: Married    Spouse name: Not on file   Number of children: Not on file   Years of education: Not on file   Highest education level: Not on file  Occupational History   Not on file  Tobacco Use   Smoking status: Former    Current packs/day: 0.00    Types: Cigarettes    Start date: 72    Quit date: 1993    Years since quitting: 31.9    Passive exposure: Past   Smokeless tobacco: Never  Vaping Use   Vaping status: Never Used  Substance and Sexual Activity   Alcohol use: Never   Drug use: Never   Sexual activity: Yes  Other Topics Concern   Not on file  Social History Narrative   Not on file   Social Determinants of Health   Financial Resource Strain: Not on file  Food Insecurity: Not on file  Transportation Needs: Not on file  Physical Activity: Not on file  Stress: Not on file  Social Connections: Not on file  Intimate Partner Violence: Not on file  FAMILY HISTORY:  Family History  Problem Relation Age of Onset   Cancer Father    Stroke Sister    Kidney disease Sister    Heart disease Brother     CURRENT MEDICATIONS:  Outpatient Encounter Medications as of 02/24/2023  Medication Sig   amLODipine (NORVASC) 10 MG tablet Take 10 mg by mouth daily.   aspirin EC 81 MG tablet Take 81 mg by mouth daily. Swallow whole.   carvedilol (COREG) 12.5 MG tablet Take 12.5 mg by mouth 2 (two) times daily with a meal.   cholecalciferol (VITAMIN D3) 25 MCG (1000 UNIT) tablet Take 1,000 Units by mouth daily.   cloNIDine (CATAPRES) 0.3 MG tablet Take 0.3 mg by mouth in the morning, at noon, and at bedtime.   epoetin alfa (EPOGEN) 3000 UNIT/ML injection Inject into the skin.   ferrous sulfate 325 (65 FE) MG tablet Take 1 tablet (325 mg total) by mouth 2 (two) times daily with a meal. (Patient taking differently: Take 325 mg by mouth daily.)   furosemide (LASIX) 40 MG tablet Take 40 mg by mouth 2 (two) times daily.   hydrALAZINE  (APRESOLINE) 100 MG tablet Take 100 mg by mouth in the morning, at noon, and at bedtime.   pantoprazole (PROTONIX) 40 MG tablet Take 1 tablet (40 mg total) by mouth 2 (two) times daily for 60 days, THEN 1 tablet (40 mg total) daily.   simvastatin (ZOCOR) 20 MG tablet Take 20 mg by mouth daily.   No facility-administered encounter medications on file as of 02/24/2023.    ALLERGIES:  No Known Allergies  LABORATORY DATA:  I have reviewed the labs as listed.  CBC    Component Value Date/Time   WBC 9.6 01/13/2023 1305   RBC 3.55 (L) 01/13/2023 1305   HGB 10.7 (L) 01/13/2023 1305   HCT 34.1 (L) 01/13/2023 1305   PLT 245 01/13/2023 1305   MCV 96.1 01/13/2023 1305   MCH 30.1 01/13/2023 1305   MCHC 31.4 01/13/2023 1305   RDW 13.6 01/13/2023 1305   LYMPHSABS 1.8 11/26/2022 1207   MONOABS 0.4 11/26/2022 1207   EOSABS 0.4 11/26/2022 1207   BASOSABS 0.0 11/26/2022 1207      Latest Ref Rng & Units 11/26/2022   12:07 PM 08/25/2022    9:19 AM 05/22/2022   10:58 AM  CMP  Glucose 70 - 99 mg/dL 098  119  147   BUN 8 - 23 mg/dL 52  50  56   Creatinine 0.61 - 1.24 mg/dL 8.29  5.62  1.30   Sodium 135 - 145 mmol/L 141  138  135   Potassium 3.5 - 5.1 mmol/L 4.3  4.1  3.8   Chloride 98 - 111 mmol/L 107  104  106   CO2 22 - 32 mmol/L 21  23  22    Calcium 8.9 - 10.3 mg/dL 8.4  8.9  8.4   Total Protein 6.5 - 8.1 g/dL 7.9  8.8    Total Bilirubin 0.3 - 1.2 mg/dL 0.7  0.4    Alkaline Phos 38 - 126 U/L 70  74    AST 15 - 41 U/L 16  19    ALT 0 - 44 U/L 13  13      DIAGNOSTIC IMAGING:  I have independently reviewed the relevant imaging and discussed with the patient.   WRAP UP:  All questions were answered. The patient knows to call the clinic with any problems, questions or concerns.  Medical decision making:  Low***  Time spent on visit: I spent 15 minutes counseling the patient face to face. The total time spent in the appointment was 22 minutes and more than 50% was on counseling.  Carnella Guadalajara, PA-C  ***

## 2023-02-24 ENCOUNTER — Inpatient Hospital Stay: Payer: Medicare Other

## 2023-02-24 ENCOUNTER — Inpatient Hospital Stay: Payer: Medicare Other | Attending: Hematology | Admitting: Physician Assistant

## 2023-02-24 VITALS — BP 157/65 | HR 54 | Temp 96.6°F | Resp 16 | Wt 215.6 lb

## 2023-02-24 DIAGNOSIS — D509 Iron deficiency anemia, unspecified: Secondary | ICD-10-CM

## 2023-02-24 DIAGNOSIS — D631 Anemia in chronic kidney disease: Secondary | ICD-10-CM | POA: Insufficient documentation

## 2023-02-24 DIAGNOSIS — Z87891 Personal history of nicotine dependence: Secondary | ICD-10-CM | POA: Insufficient documentation

## 2023-02-24 DIAGNOSIS — N185 Chronic kidney disease, stage 5: Secondary | ICD-10-CM | POA: Diagnosis not present

## 2023-02-24 DIAGNOSIS — E538 Deficiency of other specified B group vitamins: Secondary | ICD-10-CM | POA: Diagnosis not present

## 2023-02-24 LAB — IRON AND TIBC
Iron: 71 ug/dL (ref 45–182)
Saturation Ratios: 24 % (ref 17.9–39.5)
TIBC: 299 ug/dL (ref 250–450)
UIBC: 228 ug/dL

## 2023-02-24 LAB — FERRITIN: Ferritin: 275 ng/mL (ref 24–336)

## 2023-02-24 LAB — COMPREHENSIVE METABOLIC PANEL
ALT: 12 U/L (ref 0–44)
AST: 16 U/L (ref 15–41)
Albumin: 4 g/dL (ref 3.5–5.0)
Alkaline Phosphatase: 75 U/L (ref 38–126)
Anion gap: 12 (ref 5–15)
BUN: 46 mg/dL — ABNORMAL HIGH (ref 8–23)
CO2: 20 mmol/L — ABNORMAL LOW (ref 22–32)
Calcium: 9 mg/dL (ref 8.9–10.3)
Chloride: 109 mmol/L (ref 98–111)
Creatinine, Ser: 4.95 mg/dL — ABNORMAL HIGH (ref 0.61–1.24)
GFR, Estimated: 11 mL/min — ABNORMAL LOW (ref >60)
Glucose, Bld: 151 mg/dL — ABNORMAL HIGH (ref 70–99)
Potassium: 4 mmol/L (ref 3.5–5.1)
Sodium: 141 mmol/L (ref 135–145)
Total Bilirubin: 0.4 mg/dL (ref <1.2)
Total Protein: 8.4 g/dL — ABNORMAL HIGH (ref 6.5–8.1)

## 2023-02-24 LAB — CBC WITH DIFFERENTIAL/PLATELET
Abs Immature Granulocytes: 0.01 10*3/uL (ref 0.00–0.07)
Basophils Absolute: 0.1 10*3/uL (ref 0.0–0.1)
Basophils Relative: 1 %
Eosinophils Absolute: 0.4 10*3/uL (ref 0.0–0.5)
Eosinophils Relative: 5 %
HCT: 34.4 % — ABNORMAL LOW (ref 39.0–52.0)
Hemoglobin: 11.1 g/dL — ABNORMAL LOW (ref 13.0–17.0)
Immature Granulocytes: 0 %
Lymphocytes Relative: 21 %
Lymphs Abs: 1.8 10*3/uL (ref 0.7–4.0)
MCH: 30 pg (ref 26.0–34.0)
MCHC: 32.3 g/dL (ref 30.0–36.0)
MCV: 93 fL (ref 80.0–100.0)
Monocytes Absolute: 0.4 10*3/uL (ref 0.1–1.0)
Monocytes Relative: 5 %
Neutro Abs: 5.9 10*3/uL (ref 1.7–7.7)
Neutrophils Relative %: 68 %
Platelets: 265 10*3/uL (ref 150–400)
RBC: 3.7 MIL/uL — ABNORMAL LOW (ref 4.22–5.81)
RDW: 13.1 % (ref 11.5–15.5)
WBC: 8.6 10*3/uL (ref 4.0–10.5)
nRBC: 0 % (ref 0.0–0.2)

## 2023-02-24 NOTE — Patient Instructions (Addendum)
[facility] **VISIT SUMMARY & IMPORTANT INSTRUCTIONS **   You were seen today by Rojelio Brenner PA-C for your anemia.   Your chronic kidney disease is the main cause of your anemia. Your blood levels (hemoglobin) look much better after you started Retacrit shots. Continue your RETACRIT INJECTION once every 6 weeks Continue your Vitamin B12 EVERY OTHER DAY   FOLLOW-UP APPOINTMENT: Labs and follow-up visit in 3 months  ** Thank you for trusting me with your healthcare!  I strive to provide all of my patients with quality care at each visit.  If you receive a survey for this visit, I would be so grateful to you for taking the time to provide feedback.  Thank you in advance!  ~ Cadee Agro                   Dr. Doreatha Massed   &   Rojelio Brenner, PA-C   - - - - - - - - - - - - - - - - - -    Thank you for choosing [facility] to provide your oncology and hematology care.  To afford each patient quality time with our provider, please arrive at least 15 minutes before your scheduled appointment time.   If you have a lab appointment with the Cancer Center please come in thru the Main Entrance and check in at the main information desk.  You need to re-schedule your appointment should you arrive 10 or more minutes late.  We strive to give you quality time with our providers, and arriving late affects you and other patients whose appointments are after yours.  Also, if you no show three or more times for appointments you may be dismissed from the clinic at the providers discretion.     Again, thank you for choosing [facility].  Our hope is that these requests will decrease the amount of time that you wait before being seen by our physicians.       _____________________________________________________________  Should you have questions after your visit to [facility], please contact our  office at [phone] and follow the prompts.  Our office hours are 8:00 a.m. and 4:30 p.m. Monday - Friday.  Please note that voicemails left after 4:00 p.m. may not be returned until the following business day.  We are closed weekends and major holidays.  You do have access to a nurse 24-7, just call the main number to the clinic [phone] and do not press any options, hold on the line and a nurse will answer the phone.    For prescription refill requests, have your pharmacy contact our office and allow 72 hours.

## 2023-02-24 NOTE — Progress Notes (Signed)
Patient's hemoglobin noted to be 11.1 today. No injection needed per parameters.

## 2023-02-25 DIAGNOSIS — D649 Anemia, unspecified: Secondary | ICD-10-CM | POA: Diagnosis not present

## 2023-02-25 DIAGNOSIS — E211 Secondary hyperparathyroidism, not elsewhere classified: Secondary | ICD-10-CM | POA: Diagnosis not present

## 2023-02-25 DIAGNOSIS — N189 Chronic kidney disease, unspecified: Secondary | ICD-10-CM | POA: Diagnosis not present

## 2023-02-25 DIAGNOSIS — R809 Proteinuria, unspecified: Secondary | ICD-10-CM | POA: Diagnosis not present

## 2023-02-25 DIAGNOSIS — N185 Chronic kidney disease, stage 5: Secondary | ICD-10-CM | POA: Diagnosis not present

## 2023-02-25 DIAGNOSIS — D631 Anemia in chronic kidney disease: Secondary | ICD-10-CM | POA: Diagnosis not present

## 2023-03-10 DIAGNOSIS — N184 Chronic kidney disease, stage 4 (severe): Secondary | ICD-10-CM | POA: Diagnosis not present

## 2023-03-10 DIAGNOSIS — R809 Proteinuria, unspecified: Secondary | ICD-10-CM | POA: Diagnosis not present

## 2023-03-10 DIAGNOSIS — D519 Vitamin B12 deficiency anemia, unspecified: Secondary | ICD-10-CM | POA: Diagnosis not present

## 2023-03-10 DIAGNOSIS — I10 Essential (primary) hypertension: Secondary | ICD-10-CM | POA: Diagnosis not present

## 2023-03-10 DIAGNOSIS — E1122 Type 2 diabetes mellitus with diabetic chronic kidney disease: Secondary | ICD-10-CM | POA: Diagnosis not present

## 2023-03-10 DIAGNOSIS — E7849 Other hyperlipidemia: Secondary | ICD-10-CM | POA: Diagnosis not present

## 2023-03-10 DIAGNOSIS — N2581 Secondary hyperparathyroidism of renal origin: Secondary | ICD-10-CM | POA: Diagnosis not present

## 2023-03-10 DIAGNOSIS — E538 Deficiency of other specified B group vitamins: Secondary | ICD-10-CM | POA: Diagnosis not present

## 2023-03-10 DIAGNOSIS — Z23 Encounter for immunization: Secondary | ICD-10-CM | POA: Diagnosis not present

## 2023-03-10 DIAGNOSIS — E559 Vitamin D deficiency, unspecified: Secondary | ICD-10-CM | POA: Diagnosis not present

## 2023-03-18 DIAGNOSIS — N185 Chronic kidney disease, stage 5: Secondary | ICD-10-CM | POA: Diagnosis not present

## 2023-03-18 DIAGNOSIS — E1129 Type 2 diabetes mellitus with other diabetic kidney complication: Secondary | ICD-10-CM | POA: Diagnosis not present

## 2023-03-18 DIAGNOSIS — R809 Proteinuria, unspecified: Secondary | ICD-10-CM | POA: Diagnosis not present

## 2023-03-18 DIAGNOSIS — E1122 Type 2 diabetes mellitus with diabetic chronic kidney disease: Secondary | ICD-10-CM | POA: Diagnosis not present

## 2023-03-23 DIAGNOSIS — E1122 Type 2 diabetes mellitus with diabetic chronic kidney disease: Secondary | ICD-10-CM | POA: Diagnosis not present

## 2023-03-23 DIAGNOSIS — D649 Anemia, unspecified: Secondary | ICD-10-CM | POA: Diagnosis not present

## 2023-03-23 DIAGNOSIS — I10 Essential (primary) hypertension: Secondary | ICD-10-CM | POA: Diagnosis not present

## 2023-03-23 DIAGNOSIS — I5032 Chronic diastolic (congestive) heart failure: Secondary | ICD-10-CM | POA: Diagnosis not present

## 2023-04-07 ENCOUNTER — Inpatient Hospital Stay: Payer: Medicare Other | Attending: Hematology

## 2023-04-07 ENCOUNTER — Inpatient Hospital Stay: Payer: Medicare Other

## 2023-04-07 DIAGNOSIS — N185 Chronic kidney disease, stage 5: Secondary | ICD-10-CM | POA: Diagnosis not present

## 2023-04-07 DIAGNOSIS — D631 Anemia in chronic kidney disease: Secondary | ICD-10-CM | POA: Insufficient documentation

## 2023-04-07 DIAGNOSIS — E538 Deficiency of other specified B group vitamins: Secondary | ICD-10-CM | POA: Insufficient documentation

## 2023-04-07 LAB — CBC
HCT: 35 % — ABNORMAL LOW (ref 39.0–52.0)
Hemoglobin: 11.2 g/dL — ABNORMAL LOW (ref 13.0–17.0)
MCH: 29.9 pg (ref 26.0–34.0)
MCHC: 32 g/dL (ref 30.0–36.0)
MCV: 93.3 fL (ref 80.0–100.0)
Platelets: 285 10*3/uL (ref 150–400)
RBC: 3.75 MIL/uL — ABNORMAL LOW (ref 4.22–5.81)
RDW: 13.8 % (ref 11.5–15.5)
WBC: 9.6 10*3/uL (ref 4.0–10.5)
nRBC: 0 % (ref 0.0–0.2)

## 2023-04-07 NOTE — Progress Notes (Signed)
 No injection needed today due to hemoglobin of 11.2. Patient made aware and verbalized understanding.  Discharged from clinic ambulatory in stable condition. Alert and oriented x 3. F/U with [facility] as scheduled.

## 2023-04-12 ENCOUNTER — Encounter (HOSPITAL_COMMUNITY)
Admission: RE | Admit: 2023-04-12 | Discharge: 2023-04-12 | Disposition: A | Discharge status: Home / Self Care | Payer: Medicare Other | Source: Ambulatory Visit | Attending: Urology | Admitting: Urology

## 2023-04-15 ENCOUNTER — Ambulatory Visit (HOSPITAL_COMMUNITY): Payer: Medicare Other | Admitting: Certified Registered"

## 2023-04-15 ENCOUNTER — Ambulatory Visit (HOSPITAL_BASED_OUTPATIENT_CLINIC_OR_DEPARTMENT_OTHER): Payer: Medicare Other | Admitting: Certified Registered"

## 2023-04-15 ENCOUNTER — Ambulatory Visit (HOSPITAL_COMMUNITY)
Admission: RE | Admit: 2023-04-15 | Discharge: 2023-04-15 | Disposition: A | Discharge status: Home / Self Care | Payer: Medicare Other | Attending: Urology | Admitting: Urology

## 2023-04-15 ENCOUNTER — Encounter (HOSPITAL_COMMUNITY)
Admission: RE | Disposition: A | Discharge status: Home / Self Care | Payer: Self-pay | Source: Home / Self Care | Attending: Urology

## 2023-04-15 ENCOUNTER — Encounter (HOSPITAL_COMMUNITY): Payer: Self-pay | Admitting: Urology

## 2023-04-15 DIAGNOSIS — I129 Hypertensive chronic kidney disease with stage 1 through stage 4 chronic kidney disease, or unspecified chronic kidney disease: Secondary | ICD-10-CM | POA: Insufficient documentation

## 2023-04-15 DIAGNOSIS — N433 Hydrocele, unspecified: Secondary | ICD-10-CM

## 2023-04-15 DIAGNOSIS — E1122 Type 2 diabetes mellitus with diabetic chronic kidney disease: Secondary | ICD-10-CM | POA: Insufficient documentation

## 2023-04-15 DIAGNOSIS — Z87891 Personal history of nicotine dependence: Secondary | ICD-10-CM | POA: Diagnosis not present

## 2023-04-15 DIAGNOSIS — N184 Chronic kidney disease, stage 4 (severe): Secondary | ICD-10-CM | POA: Diagnosis not present

## 2023-04-15 HISTORY — PX: HYDROCELE EXCISION: SHX482

## 2023-04-15 LAB — GLUCOSE, CAPILLARY
Glucose-Capillary: 144 mg/dL — ABNORMAL HIGH (ref 70–99)
Glucose-Capillary: 138 mg/dL — ABNORMAL HIGH (ref 70–99)

## 2023-04-15 SURGERY — HYDROCELECTOMY
Anesthesia: General | Site: Groin | Laterality: Right

## 2023-04-15 MED ORDER — PHENYLEPHRINE 80 MCG/ML (10ML) SYRINGE FOR IV PUSH (FOR BLOOD PRESSURE SUPPORT)
PREFILLED_SYRINGE | INTRAVENOUS | Status: DC | PRN
Start: 2023-04-15 — End: 2023-04-15
  Administered 2023-04-15: 160 ug via INTRAVENOUS
  Administered 2023-04-15: 80 ug via INTRAVENOUS
  Administered 2023-04-15: 160 ug via INTRAVENOUS

## 2023-04-15 MED ORDER — LIDOCAINE HCL (PF) 2 % IJ SOLN
INTRAMUSCULAR | Status: DC | PRN
Start: 2023-04-15 — End: 2023-04-15
  Administered 2023-04-15: 80 mg via INTRADERMAL

## 2023-04-15 MED ORDER — BUPIVACAINE HCL (PF) 0.25 % IJ SOLN
INTRAMUSCULAR | Status: AC
Start: 2023-04-15 — End: ?
  Filled 2023-04-15: qty 30

## 2023-04-15 MED ORDER — CHLORHEXIDINE GLUCONATE 0.12 % MT SOLN: 15.0000 mL | Freq: Once | OROMUCOSAL | Status: DC

## 2023-04-15 MED ORDER — FENTANYL CITRATE (PF) 100 MCG/2ML IJ SOLN
INTRAMUSCULAR | Status: AC
  Filled 2023-04-15: qty 2

## 2023-04-15 MED ORDER — CEFAZOLIN SODIUM-DEXTROSE 2-4 GM/100ML-% IV SOLN
2.0000 g | INTRAVENOUS | Status: AC
  Administered 2023-04-15: 2 g via INTRAVENOUS
  Filled 2023-04-15: qty 100

## 2023-04-15 MED ORDER — LACTATED RINGERS IV SOLN: INTRAVENOUS | Status: DC

## 2023-04-15 MED ORDER — OXYCODONE HCL 5 MG/5ML PO SOLN: 5.0000 mg | Freq: Once | ORAL | Status: DC | PRN

## 2023-04-15 MED ORDER — PROPOFOL 10 MG/ML IV BOLUS
INTRAVENOUS | Status: DC | PRN
  Administered 2023-04-15: 30 mg via INTRAVENOUS
  Administered 2023-04-15: 120 mg via INTRAVENOUS

## 2023-04-15 MED ORDER — 0.9 % SODIUM CHLORIDE (POUR BTL) OPTIME
TOPICAL | Status: DC | PRN
  Administered 2023-04-15: 1000 mL

## 2023-04-15 MED ORDER — FENTANYL CITRATE (PF) 100 MCG/2ML IJ SOLN
INTRAMUSCULAR | Status: DC | PRN
  Administered 2023-04-15 (×2): 25 ug via INTRAVENOUS
  Administered 2023-04-15 (×3): 50 ug via INTRAVENOUS

## 2023-04-15 MED ORDER — LIDOCAINE HCL (PF) 2 % IJ SOLN
INTRAMUSCULAR | Status: AC
  Filled 2023-04-15: qty 5

## 2023-04-15 MED ORDER — ONDANSETRON HCL 4 MG/2ML IJ SOLN
4.0000 mg | Freq: Once | INTRAMUSCULAR | Status: AC | PRN
  Administered 2023-04-15: 4 mg via INTRAVENOUS

## 2023-04-15 MED ORDER — SUGAMMADEX SODIUM 200 MG/2ML IV SOLN
INTRAVENOUS | Status: DC | PRN
  Administered 2023-04-15: 200 mg via INTRAVENOUS

## 2023-04-15 MED ORDER — ONDANSETRON HCL 4 MG/2ML IJ SOLN
INTRAMUSCULAR | Status: AC
  Filled 2023-04-15: qty 2

## 2023-04-15 MED ORDER — CHLORHEXIDINE GLUCONATE 0.12 % MT SOLN
15.0000 mL | Freq: Once | OROMUCOSAL | Status: AC
  Administered 2023-04-15: 15 mL via OROMUCOSAL

## 2023-04-15 MED ORDER — ORAL CARE MOUTH RINSE: 15.0000 mL | Freq: Once | OROMUCOSAL | Status: DC

## 2023-04-15 MED ORDER — BUPIVACAINE HCL (PF) 0.25 % IJ SOLN
INTRAMUSCULAR | Status: DC | PRN
  Administered 2023-04-15: 10 mL

## 2023-04-15 MED ORDER — FENTANYL CITRATE PF 50 MCG/ML IJ SOSY
25.0000 ug | PREFILLED_SYRINGE | INTRAMUSCULAR | Status: DC | PRN
Start: 2023-04-15 — End: 2023-04-15

## 2023-04-15 MED ORDER — PHENYLEPHRINE HCL-NACL 20-0.9 MG/250ML-% IV SOLN
INTRAVENOUS | Status: AC
  Filled 2023-04-15: qty 250

## 2023-04-15 MED ORDER — LACTATED RINGERS IV SOLN: INTRAVENOUS | Status: DC | PRN

## 2023-04-15 MED ORDER — ROCURONIUM BROMIDE 10 MG/ML (PF) SYRINGE
PREFILLED_SYRINGE | INTRAVENOUS | Status: DC | PRN
  Administered 2023-04-15: 40 mg via INTRAVENOUS

## 2023-04-15 MED ORDER — OXYCODONE HCL 5 MG PO TABS: 5.0000 mg | ORAL_TABLET | Freq: Once | ORAL | Status: DC | PRN

## 2023-04-15 MED ORDER — EPHEDRINE SULFATE-NACL 50-0.9 MG/10ML-% IV SOSY
PREFILLED_SYRINGE | INTRAVENOUS | Status: DC | PRN
Start: 2023-04-15 — End: 2023-04-15
  Administered 2023-04-15: 5 mg via INTRAVENOUS
  Administered 2023-04-15: 10 mg via INTRAVENOUS
  Administered 2023-04-15: 5 mg via INTRAVENOUS

## 2023-04-15 MED ORDER — SEVOFLURANE IN SOLN
RESPIRATORY_TRACT | Status: AC
  Filled 2023-04-15: qty 250

## 2023-04-15 MED ORDER — PROPOFOL 10 MG/ML IV BOLUS
INTRAVENOUS | Status: AC
  Filled 2023-04-15: qty 20

## 2023-04-15 MED ORDER — PHENYLEPHRINE 80 MCG/ML (10ML) SYRINGE FOR IV PUSH (FOR BLOOD PRESSURE SUPPORT)
PREFILLED_SYRINGE | INTRAVENOUS | Status: AC
  Filled 2023-04-15: qty 10

## 2023-04-15 MED ORDER — ROCURONIUM BROMIDE 10 MG/ML (PF) SYRINGE
PREFILLED_SYRINGE | INTRAVENOUS | Status: AC
  Filled 2023-04-15: qty 10

## 2023-04-15 MED ORDER — HYDROCODONE-ACETAMINOPHEN 5-325 MG PO TABS: 1.0000 | ORAL_TABLET | ORAL | 0 refills | Status: DC | PRN

## 2023-04-15 MED ORDER — EPHEDRINE 5 MG/ML INJ
INTRAVENOUS | Status: AC
  Filled 2023-04-15: qty 5

## 2023-04-15 SURGICAL SUPPLY — 24 items
BLADE SURG 15 STRL LF DISP TIS (BLADE) ×1 IMPLANT
COVER LIGHT HANDLE STERIS (MISCELLANEOUS) ×2 IMPLANT
DERMABOND ADVANCED .7 DNX12 (GAUZE/BANDAGES/DRESSINGS) ×1 IMPLANT
ELECT REM PT RETURN 9FT ADLT (ELECTROSURGICAL) ×1
ELECTRODE REM PT RTRN 9FT ADLT (ELECTROSURGICAL) ×1 IMPLANT
GAUZE SPONGE 4X4 12PLY STRL (GAUZE/BANDAGES/DRESSINGS) ×2 IMPLANT
GLOVE BIO SURGEON STRL SZ7 (GLOVE) IMPLANT
GLOVE BIO SURGEON STRL SZ8 (GLOVE) ×1 IMPLANT
GLOVE BIOGEL PI IND STRL 7.0 (GLOVE) ×2 IMPLANT
GLOVE BIOGEL PI IND STRL 8 (GLOVE) ×1 IMPLANT
GOWN STRL REUS W/TWL LRG LVL3 (GOWN DISPOSABLE) ×1 IMPLANT
GOWN STRL REUS W/TWL XL LVL3 (GOWN DISPOSABLE) ×1 IMPLANT
KIT TURNOVER KIT A (KITS) ×1 IMPLANT
MANIFOLD NEPTUNE II (INSTRUMENTS) ×1 IMPLANT
NDL HYPO 25X1 1.5 SAFETY (NEEDLE) ×1 IMPLANT
NEEDLE HYPO 25X1 1.5 SAFETY (NEEDLE) ×1
NS IRRIG 1000ML POUR BTL (IV SOLUTION) ×1 IMPLANT
PACK MINOR (CUSTOM PROCEDURE TRAY) ×1 IMPLANT
PAD ARMBOARD 7.5X6 YLW CONV (MISCELLANEOUS) ×1 IMPLANT
SET BASIN LINEN APH (SET/KITS/TRAYS/PACK) ×1 IMPLANT
SUPPORT SCROTAL LG STRP (MISCELLANEOUS) ×1 IMPLANT
SUT MNCRL AB 4-0 PS2 18 (SUTURE) ×1 IMPLANT
SUT VIC AB 3-0 SH 27X BRD (SUTURE) ×1 IMPLANT
SYR CONTROL 10ML LL (SYRINGE) ×1 IMPLANT

## 2023-04-15 NOTE — H&P (Signed)
istory of Present Illness: 78 year old here for right hydrocelectomy.  Seen a few years ago by Dr. Annabell Howells for bladder abnormalities.  He had cystoscopy which revealed no abnormalities.  He at that time was found to have a right hydrocele but did not feel like he needed management.   He has had increased size and difficulty with this hydrocele.  He like to consider management.  He has not had an ultrasound performed.   No significant urologic symptoms regarding urination.       Past Medical History:  Diagnosis Date   Arthritis     CKD (chronic kidney disease) stage 4, GFR 15-29 ml/min (HCC)     Diabetes mellitus without complication (HCC)     Gout     Hypertension                 Past Surgical History:  Procedure Laterality Date   AV FISTULA PLACEMENT Left 12/16/2021    Procedure: LEFT ARM ARTERIOVENOUS (AV) FISTULA CREATION;  Surgeon: Larina Earthly, MD;  Location: [facility];  Service: Vascular;  Laterality: Left;   BIOPSY   05/26/2022    Procedure: BIOPSY;  Surgeon: Dolores Frame, MD;  [facility];  Service: Gastroenterology;;   COLONOSCOPY WITH PROPOFOL N/A 05/26/2022    Procedure: COLONOSCOPY WITH PROPOFOL;  Surgeon: Dolores Frame, MD;  [facility];  Service: Gastroenterology;  Laterality: N/A;  200pm, asa 3   ESOPHAGOGASTRODUODENOSCOPY (EGD) WITH PROPOFOL N/A 05/26/2022    Procedure: ESOPHAGOGASTRODUODENOSCOPY (EGD) WITH PROPOFOL;  Surgeon: Dolores Frame, MD;  [facility];  Service: Gastroenterology;  Laterality: N/A;   HEMOSTASIS CLIP PLACEMENT   05/26/2022    Procedure: HEMOSTASIS CLIP PLACEMENT;  Surgeon: Dolores Frame, MD;  [facility];  Service: Gastroenterology;;   KNEE SURGERY       POLYPECTOMY   05/26/2022    Procedure: POLYPECTOMY;  Surgeon: Dolores Frame, MD;  [facility];  Service: Gastroenterology;;   TONSILLECTOMY              Home Medications:  Allergies as  of 01/19/2023   No Known Allergies         Medication List           Accurate as of January 19, 2023  8:37 AM. If you have any questions, ask your nurse or doctor.              amLODipine 10 MG tablet Commonly known as: NORVASC Take 10 mg by mouth daily.    aspirin EC 81 MG tablet Take 81 mg by mouth daily. Swallow whole.    carvedilol 12.5 MG tablet Commonly known as: COREG Take 12.5 mg by mouth 2 (two) times daily with a meal.    cholecalciferol 25 MCG (1000 UNIT) tablet Commonly known as: VITAMIN D3 Take 1,000 Units by mouth daily.    cloNIDine 0.3 MG tablet Commonly known as: CATAPRES Take 0.3 mg by mouth in the morning, at noon, and at bedtime.    Cyanocobalamin 1000 MCG Subl Place 1,000 mcg under the tongue daily.    epoetin alfa 3000 UNIT/ML injection Commonly known as: EPOGEN Inject into the skin.    ferrous sulfate 325 (65 FE) MG tablet Take 1 tablet (325 mg total) by mouth 2 (two) times daily with a meal. What changed: when to take this    furosemide 40 MG tablet Commonly known as: LASIX Take 40 mg by mouth 2 (two) times  daily.    hydrALAZINE 100 MG tablet Commonly known as: APRESOLINE Take 100 mg by mouth in the morning, at noon, and at bedtime.    pantoprazole 40 MG tablet Commonly known as: PROTONIX Take 1 tablet (40 mg total) by mouth 2 (two) times daily for 60 days, THEN 1 tablet (40 mg total) daily. Start taking on: May 26, 2022    simvastatin 20 MG tablet Commonly known as: ZOCOR Take 20 mg by mouth daily.             Allergies:  Allergies  No Known Allergies          Family History  Problem Relation Age of Onset   Cancer Father     Stroke Sister     Kidney disease Sister     Heart disease Brother            Social History:  reports that he quit smoking about 31 years ago. His smoking use included cigarettes. He started smoking about 51 years ago. He has been exposed to tobacco smoke. He has never used smokeless  tobacco. He reports that he does not drink alcohol and does not use drugs.   ROS: A complete review of systems was performed.  All systems are negative except for pertinent findings as noted.   Physical Exam:  Vital signs in last 24 hours: There were no vitals taken for this visit. Constitutional:  Alert and oriented, No acute distress Cardiovascular: Regular rate  Respiratory: Normal respiratory effort GI: No inguinal hernias noted GU: Penis buried because of large right-sided hydrocele.  Right testicle nonpalpable, left normal. Lymphatic: No lymphadenopathy Neurologic: Grossly intact, no focal deficits Psychiatric: Normal mood and affect   I have reviewed prior pt notes--Dr. Belva Crome notes   I have reviewed notes from referring/previous physicians   I have reviewed urinalysis results--clear   I have independently reviewed prior imaging--prior renal ultrasound   I have reviewed IPSS sheet-score 9     Impression/Assessment:  Large right hydrocele, symptomatic   Plan:  The risks/benefits/alternatives to right hydrocelectomy was explained ot the patient and he understands and wishes to proceed with surgery

## 2023-04-15 NOTE — Op Note (Signed)
Preoperative diagnosis: Right Hydrocele  Postoperative diagnosis: Same  Procedure: 1.  Right hydrocelectomy  Attending: Wilkie Aye, MD  Anesthesia: General  History of blood loss: Minimal  Antibiotics: ancef  Drains: none  Specimens: 1. Right hydrocele sac  Findings: 15cm hydrocele. 900cc of fluid drained  Indications: Patient is a 78 year old male with a history of right hydrocele that was growing in size and causing him pain with walking.  We discussed the treatment options including observation versus excision after discussing treatment options he proceed with excision.   Procedure in detail: Prior to procedure consent was obtained.  Patient was brought to the operating room and a brief timeout was done to ensure correct patient, correct procedure, correct site.  General anesthesia was administered and patient was placed in supine position.  His genitalia was then prepped and draped in usual sterile fashion.  A 8cm incision was made in the right hemiscrotum.  We dissected down to the tunica and then incised the tunica. A large hydrocele was encountered and was drained. We then excised the hydrocele sac and then over sewed the edge with 2-0 Vicryl in a running fashion. We then returned the testis to the right hemiscrotum and closed the overlying dartos with 3-0 vicryl in a running fashion. The skin was then closed with 4-0 monocryl in a running fashion. Dermabond was placed on the incision.  A dressing was then applied to the incision.  We then placed a scrotal fluff and this then concluded the procedure which was well tolerated by the patient.  Complications: None  Condition: Stable, extubated, transferred to PACU.  Plan: Patient is to be discharged home.  He is to follow up in 2 weeks for wound check.

## 2023-04-15 NOTE — Anesthesia Procedure Notes (Signed)
Procedure Name: Intubation Date/Time: 04/15/2023 7:45 AM  Performed by: Julian Reil, CRNAPre-anesthesia Checklist: Patient identified, Emergency Drugs available, Suction available and Patient being monitored Patient Re-evaluated:Patient Re-evaluated prior to induction Oxygen Delivery Method: Circle system utilized Preoxygenation: Pre-oxygenation with 100% oxygen Induction Type: IV induction Ventilation: Mask ventilation without difficulty and Oral airway inserted - appropriate to patient size Laryngoscope Size: Mac and 4 Grade View: Grade II Tube type: Oral Tube size: 7.5 mm Number of attempts: 1 Airway Equipment and Method: Stylet and Oral airway Placement Confirmation: ETT inserted through vocal cords under direct vision, positive ETCO2 and breath sounds checked- equal and bilateral Secured at: 23 cm Tube secured with: Tape Dental Injury: Teeth and Oropharynx as per pre-operative assessment

## 2023-04-15 NOTE — Anesthesia Preprocedure Evaluation (Addendum)
Anesthesia Evaluation  Patient identified by MRN, date of birth, ID band Patient awake    Reviewed: Allergy & Precautions, H&P , NPO status , Patient's Chart, lab work & pertinent test results, reviewed documented beta blocker date and time   Airway Mallampati: II  TM Distance: >3 FB Neck ROM: full    Dental  (+) Dental Advisory Given, Edentulous Upper, Edentulous Lower   Pulmonary pneumonia, former smoker   Pulmonary exam normal breath sounds clear to auscultation       Cardiovascular Exercise Tolerance: Good hypertension,  Rhythm:regular Rate:Normal     Neuro/Psych negative neurological ROS  negative psych ROS   GI/Hepatic negative GI ROS, Neg liver ROS,,,  Endo/Other  diabetes    Renal/GU Renal disease  negative genitourinary   Musculoskeletal   Abdominal   Peds  Hematology  (+) Blood dyscrasia, anemia   Anesthesia Other Findings   Reproductive/Obstetrics negative OB ROS                             Anesthesia Physical Anesthesia Plan  ASA: 2  Anesthesia Plan: General and General LMA   Post-op Pain Management:    Induction:   PONV Risk Score and Plan: Ondansetron  Airway Management Planned:   Additional Equipment:   Intra-op Plan:   Post-operative Plan:   Informed Consent: I have reviewed the patients History and Physical, chart, labs and discussed the procedure including the risks, benefits and alternatives for the proposed anesthesia with the patient or authorized representative who has indicated his/her understanding and acceptance.     Dental Advisory Given  Plan Discussed with: CRNA  Anesthesia Plan Comments:        Anesthesia Quick Evaluation

## 2023-04-15 NOTE — Transfer of Care (Signed)
Immediate Anesthesia Transfer of Care Note  Patient: Devin [name]  Procedure(s) Performed: HYDROCELECTOMY ADULT (Right: Groin)  Patient Location: PACU  Anesthesia Type:General  Level of Consciousness: awake, alert , and oriented  Airway & Oxygen Therapy: Patient Spontanous Breathing and Patient connected to face mask oxygen  Post-op Assessment: Report given to RN and Post -op Vital signs reviewed and stable  Post vital signs: Reviewed and stable  Last Vitals:  Vitals Value Taken Time  BP    Temp 97.4   Pulse 58 04/15/23 0846  Resp 16   SpO2 100 % 04/15/23 0846  Vitals shown include unfiled device data.  Last Pain:  Vitals:   04/15/23 0637  PainSc: 0-No pain         Complications: No notable events documented.

## 2023-04-16 ENCOUNTER — Encounter (HOSPITAL_COMMUNITY): Payer: Self-pay | Admitting: Urology

## 2023-04-16 LAB — SURGICAL PATHOLOGY

## 2023-04-24 NOTE — Anesthesia Postprocedure Evaluation (Signed)
Anesthesia Post Note  Patient: Devin [name]  Procedure(s) Performed: HYDROCELECTOMY ADULT (Right: Groin)  Patient location during evaluation: Phase II Anesthesia Type: General Level of consciousness: awake Pain management: pain level controlled Vital Signs Assessment: post-procedure vital signs reviewed and stable Respiratory status: spontaneous breathing and respiratory function stable Cardiovascular status: blood pressure returned to baseline and stable Postop Assessment: no headache and no apparent nausea or vomiting Anesthetic complications: no Comments: Late entry   No notable events documented.   Last Vitals:  Vitals:   04/15/23 0930 04/15/23 0945  BP: (!) 173/68 (!) 168/66  Pulse: 65 67  Resp: 13 14  Temp:  36.4 C  SpO2: 90% 96%    Last Pain:  Vitals:   04/16/23 1429  TempSrc:   PainSc: 0-No pain                 Windell Norfolk

## 2023-05-03 DIAGNOSIS — Z0001 Encounter for general adult medical examination with abnormal findings: Secondary | ICD-10-CM | POA: Diagnosis not present

## 2023-05-03 NOTE — Progress Notes (Deleted)
 Name: Devin [name] DOB: Aug 23, 1945 MRN: [id number]  Diagnoses: Post-operative state  HPI: He presents postoperatively. ***He is accompanied by ***.  He underwent right hydrocelectomy by Dr. Ronne Binning on 04/15/2023.  Pathology:  A. HYDROCELE SAC, RIGHT, HYDROCELECTOMY:  Membranous fibrous tissue with chronic inflammation, histiocytic aggregates and hemosiderin deposit.  Negative for malignancy.   Postop course: Today He reports ***  He {Actions; denies-reports:120008} redness, warmth, tenderness, swelling, or drainage at the incision site(s).  He {Actions; denies-reports:120008} fevers.  He {Actions; denies-reports:120008} increased urinary urgency, frequency, nocturia, dysuria, gross hematuria, hesitancy, straining to void, or sensations of incomplete emptying.   Fall Screening: Do you usually have a device to assist in your mobility? {yes/no:20286} ***cane / ***walker / ***wheelchair   Medications: Current Outpatient Medications  Medication Sig Dispense Refill   amLODipine (NORVASC) 10 MG tablet Take 10 mg by mouth daily.     aspirin EC 81 MG tablet Take 81 mg by mouth daily. Swallow whole.     carvedilol (COREG) 12.5 MG tablet Take 12.5 mg by mouth 2 (two) times daily with a meal.     Cholecalciferol (VITAMIN D) 50 MCG (2000 UT) CAPS Take 2,000 Units by mouth daily.     cloNIDine (CATAPRES) 0.3 MG tablet Take 0.3 mg by mouth in the morning, at noon, and at bedtime.     cyanocobalamin (VITAMIN B12) 1000 MCG tablet Take 1,000 mcg by mouth once a week.     ferrous sulfate 325 (65 FE) MG tablet Take 1 tablet (325 mg total) by mouth 2 (two) times daily with a meal. (Patient taking differently: Take 325 mg by mouth daily.) 180 tablet 3   furosemide (LASIX) 40 MG tablet Take 40 mg by mouth 2 (two) times daily.     hydrALAZINE (APRESOLINE) 100 MG tablet Take 100 mg by mouth in the morning, at noon, and at bedtime.     HYDROcodone-acetaminophen (NORCO) 5-325 MG tablet Take 1  tablet by mouth every 4 (four) hours as needed for moderate pain (pain score 4-6). 30 tablet 0   pantoprazole (PROTONIX) 40 MG tablet Take 1 tablet (40 mg total) by mouth 2 (two) times daily for 60 days, THEN 1 tablet (40 mg total) daily. 390 tablet 0   simvastatin (ZOCOR) 20 MG tablet Take 20 mg by mouth daily.     No current facility-administered medications for this visit.    Allergies: No Known Allergies  Past Medical History:  Diagnosis Date   Arthritis    CKD (chronic kidney disease) stage 4, GFR 15-29 ml/min (HCC)    Diabetes mellitus without complication (HCC)    Gout    Hypertension    Past Surgical History:  Procedure Laterality Date   AV FISTULA PLACEMENT Left 12/16/2021   Procedure: LEFT ARM ARTERIOVENOUS (AV) FISTULA CREATION;  Surgeon: Larina Earthly, MD;  Location: [facility];  Service: Vascular;  Laterality: Left;   BIOPSY  05/26/2022   Procedure: BIOPSY;  Surgeon: Dolores Frame, MD;  Location: [facility];  Service: Gastroenterology;;   COLONOSCOPY WITH PROPOFOL N/A 05/26/2022   Procedure: COLONOSCOPY WITH PROPOFOL;  Surgeon: Dolores Frame, MD;  Location: [facility];  Service: Gastroenterology;  Laterality: N/A;  200pm, asa 3   ESOPHAGOGASTRODUODENOSCOPY (EGD) WITH PROPOFOL N/A 05/26/2022   Procedure: ESOPHAGOGASTRODUODENOSCOPY (EGD) WITH PROPOFOL;  Surgeon: Dolores Frame, MD;  Location: [facility];  Service: Gastroenterology;  Laterality: N/A;   HEMOSTASIS CLIP PLACEMENT  05/26/2022   Procedure: HEMOSTASIS CLIP PLACEMENT;  Surgeon: Levon Hedger  Alisia Ferrari, MD;  Location: [facility];  Service: Gastroenterology;;   HYDROCELE EXCISION Right 04/15/2023   Procedure: HYDROCELECTOMY ADULT;  Surgeon: Malen Gauze, MD;  Location: [facility];  Service: Urology;  Laterality: Right;   KNEE SURGERY     POLYPECTOMY  05/26/2022   Procedure: POLYPECTOMY;  Surgeon: Marguerita Merles, Reuel Boom, MD;  Location: [facility];  Service:  Gastroenterology;;   TONSILLECTOMY     Family History  Problem Relation Age of Onset   Cancer Father    Stroke Sister    Kidney disease Sister    Heart disease Brother    Social History   Socioeconomic History   Marital status: Married    Spouse name: Not on file   Number of children: Not on file   Years of education: Not on file   Highest education level: Not on file  Occupational History   Not on file  Tobacco Use   Smoking status: Former    Current packs/day: 0.00    Types: Cigarettes    Start date: 50    Quit date: 1993    Years since quitting: 32.1    Passive exposure: Past   Smokeless tobacco: Never  Vaping Use   Vaping status: Never Used  Substance and Sexual Activity   Alcohol use: Never   Drug use: Never   Sexual activity: Yes  Other Topics Concern   Not on file  Social History Narrative   Not on file   Social Drivers of Health   Financial Resource Strain: Not on file  Food Insecurity: Not on file  Transportation Needs: Not on file  Physical Activity: Not on file  Stress: Not on file  Social Connections: Not on file  Intimate Partner Violence: Not on file    SUBJECTIVE  Review of Systems Constitutional: Patient denies any unintentional weight loss or change in strength lntegumentary: Patient denies any rashes or pruritus Cardiovascular: Patient denies chest pain or syncope Respiratory: Patient denies shortness of breath Gastrointestinal: Patient ***denies nausea, vomiting, constipation, or diarrhea Musculoskeletal: Patient denies muscle cramps or weakness Neurologic: Patient denies convulsions or seizures Allergic/Immunologic: Patient denies recent allergic reaction(s) Hematologic/Lymphatic: Patient denies bleeding tendencies Endocrine: Patient denies heat/cold intolerance  GU: As per HPI.  OBJECTIVE There were no vitals filed for this visit. There is no height or weight on file to calculate BMI.  Physical Examination Constitutional:  No obvious distress; patient is non-toxic appearing  Cardiovascular: No visible lower extremity edema.  Respiratory: The patient does not have audible wheezing/stridor; respirations do not appear labored  Gastrointestinal: Abdomen non-distended Musculoskeletal: Normal ROM of UEs  Skin: No obvious rashes/open sores  Neurologic: CN 2-12 grossly intact Psychiatric: Answered questions appropriately with normal affect  Hematologic/Lymphatic/Immunologic: No obvious bruises or sites of spontaneous bleeding  GU: ***Incision well healed, intact, with no surrounding erythema, edema, crepitus, fluctuance, drainage / discharge, warmth, significant tenderness to palpation.   UA: ***negative *** WBC/hpf, *** RBC/hpf, *** bacteria ***Urine microscopy: ***negative *** WBC/hpf, *** RBC/hpf, *** bacteria ***with no evidence of UTI ***with no evidence of microscopic hematuria ***otherwise unremarkable ***glucosuria (secondary to ***Jardiance ***Farxiga use)  PVR: *** ml  ASSESSMENT No diagnosis found. *** We reviewed the operative procedures and findings. Pre-operative symptoms are *** since the procedure. Surgical site healing ***well. Pain is ***well controlled. *** removed; patient tolerated ***well.  We agreed to plan for follow up in *** months or sooner if needed. Patient verbalized understanding of and agreement with current plan. All questions were  answered.  PLAN Advised the following: *** ***No follow-ups on file.  ***No global period (charge regular E&M) - Cryotherapy - Cystoscopy w/ bladder Botox - Cystoscopy w/ bladder biopsy - Cystoscopy w/ retrograde pyelogram - Cystoscopy w/ hydrodistention - Cystoscopy w/ ureteral stent placement - Cystolitholapaxy - Ureteroscopy w/ ureteral stent placement - Ureteroscopy w/ stone removal - Urolift - TURBT - Prostate biopsy  ***10 day global period: - Circumcision  ***Everything else pretty much is 90 day global period (including  TURP)  No orders of the defined types were placed in this encounter.   It has been explained that the patient is to follow regularly with their PCP in addition to all other providers involved in their care and to follow instructions provided by these respective offices. Patient advised to contact urology clinic if any urologic-pertaining questions, concerns, new symptoms or problems arise in the interim period.  There are no Patient Instructions on file for this visit.  Electronically signed by:  Donnita Falls, MSN, FNP-C, CUNP 05/03/2023 12:54 PM

## 2023-05-04 ENCOUNTER — Encounter: Payer: Medicare Other | Admitting: Urology

## 2023-05-04 DIAGNOSIS — Z09 Encounter for follow-up examination after completed treatment for conditions other than malignant neoplasm: Secondary | ICD-10-CM

## 2023-05-04 DIAGNOSIS — N433 Hydrocele, unspecified: Secondary | ICD-10-CM

## 2023-05-17 DIAGNOSIS — N185 Chronic kidney disease, stage 5: Secondary | ICD-10-CM | POA: Diagnosis not present

## 2023-05-17 DIAGNOSIS — Z1329 Encounter for screening for other suspected endocrine disorder: Secondary | ICD-10-CM | POA: Diagnosis not present

## 2023-05-17 DIAGNOSIS — E1122 Type 2 diabetes mellitus with diabetic chronic kidney disease: Secondary | ICD-10-CM | POA: Diagnosis not present

## 2023-05-17 DIAGNOSIS — R5383 Other fatigue: Secondary | ICD-10-CM | POA: Diagnosis not present

## 2023-05-17 DIAGNOSIS — N183 Chronic kidney disease, stage 3 unspecified: Secondary | ICD-10-CM | POA: Diagnosis not present

## 2023-05-17 DIAGNOSIS — D649 Anemia, unspecified: Secondary | ICD-10-CM | POA: Diagnosis not present

## 2023-05-19 ENCOUNTER — Inpatient Hospital Stay: Payer: Medicare Other

## 2023-05-21 ENCOUNTER — Inpatient Hospital Stay: Payer: Medicare Other | Attending: Hematology

## 2023-05-21 ENCOUNTER — Inpatient Hospital Stay: Payer: Medicare Other

## 2023-05-21 VITALS — BP 139/60 | HR 59 | Temp 98.3°F | Resp 20

## 2023-05-21 DIAGNOSIS — D631 Anemia in chronic kidney disease: Secondary | ICD-10-CM | POA: Diagnosis not present

## 2023-05-21 DIAGNOSIS — N185 Chronic kidney disease, stage 5: Secondary | ICD-10-CM | POA: Insufficient documentation

## 2023-05-21 DIAGNOSIS — E538 Deficiency of other specified B group vitamins: Secondary | ICD-10-CM | POA: Diagnosis not present

## 2023-05-21 DIAGNOSIS — D509 Iron deficiency anemia, unspecified: Secondary | ICD-10-CM

## 2023-05-21 LAB — COMPREHENSIVE METABOLIC PANEL
ALT: 12 U/L (ref 0–44)
AST: 15 U/L (ref 15–41)
Albumin: 3.3 g/dL — ABNORMAL LOW (ref 3.5–5.0)
Alkaline Phosphatase: 73 U/L (ref 38–126)
Anion gap: 11 (ref 5–15)
BUN: 59 mg/dL — ABNORMAL HIGH (ref 8–23)
CO2: 19 mmol/L — ABNORMAL LOW (ref 22–32)
Calcium: 8.6 mg/dL — ABNORMAL LOW (ref 8.9–10.3)
Chloride: 108 mmol/L (ref 98–111)
Creatinine, Ser: 5.21 mg/dL — ABNORMAL HIGH (ref 0.61–1.24)
GFR, Estimated: 10 mL/min — ABNORMAL LOW (ref >60)
Glucose, Bld: 151 mg/dL — ABNORMAL HIGH (ref 70–99)
Potassium: 4.2 mmol/L (ref 3.5–5.1)
Sodium: 138 mmol/L (ref 135–145)
Total Bilirubin: 0.5 mg/dL (ref 0.0–1.2)
Total Protein: 8.1 g/dL (ref 6.5–8.1)

## 2023-05-21 LAB — CBC WITH DIFFERENTIAL/PLATELET
Abs Immature Granulocytes: 0.03 10*3/uL (ref 0.00–0.07)
Basophils Absolute: 0 10*3/uL (ref 0.0–0.1)
Basophils Relative: 0 %
Eosinophils Absolute: 0.4 10*3/uL (ref 0.0–0.5)
Eosinophils Relative: 4 %
HCT: 27.8 % — ABNORMAL LOW (ref 39.0–52.0)
Hemoglobin: 8.9 g/dL — ABNORMAL LOW (ref 13.0–17.0)
Immature Granulocytes: 0 %
Lymphocytes Relative: 20 %
Lymphs Abs: 1.9 10*3/uL (ref 0.7–4.0)
MCH: 30 pg (ref 26.0–34.0)
MCHC: 32 g/dL (ref 30.0–36.0)
MCV: 93.6 fL (ref 80.0–100.0)
Monocytes Absolute: 0.6 10*3/uL (ref 0.1–1.0)
Monocytes Relative: 7 %
Neutro Abs: 6.6 10*3/uL (ref 1.7–7.7)
Neutrophils Relative %: 69 %
Platelets: 240 10*3/uL (ref 150–400)
RBC: 2.97 MIL/uL — ABNORMAL LOW (ref 4.22–5.81)
RDW: 13.5 % (ref 11.5–15.5)
WBC: 9.6 10*3/uL (ref 4.0–10.5)
nRBC: 0 % (ref 0.0–0.2)

## 2023-05-21 LAB — IRON AND TIBC
Iron: 62 ug/dL (ref 45–182)
Saturation Ratios: 23 % (ref 17.9–39.5)
TIBC: 265 ug/dL (ref 250–450)
UIBC: 203 ug/dL

## 2023-05-21 LAB — VITAMIN B12: Vitamin B-12: 673 pg/mL (ref 180–914)

## 2023-05-21 LAB — FERRITIN: Ferritin: 378 ng/mL — ABNORMAL HIGH (ref 24–336)

## 2023-05-21 MED ORDER — EPOETIN ALFA-EPBX 10000 UNIT/ML IJ SOLN
10000.0000 [IU] | Freq: Once | INTRAMUSCULAR | Status: AC
  Administered 2023-05-21: 10000 [IU] via SUBCUTANEOUS
  Filled 2023-05-21: qty 1

## 2023-05-21 NOTE — Progress Notes (Signed)
 Hemoglobin today is 8.9.  We will proceed with Retacrit injection per provider orders.  Patient tolerated injection with no complaints voiced.  Site clean and dry with no bruising or swelling noted.  No complaints of pain.  Discharged with vital signs stable and no signs or symptoms of distress noted.

## 2023-05-21 NOTE — Patient Instructions (Signed)
 [facility] [place] - A DEPT OF [facility]  Discharge Instructions: Thank you for choosing [facility] to provide your oncology and hematology care.  If you have a lab appointment with the Cancer Center - please note that after April 8th, 2024, all labs will be drawn in the cancer center.  You do not have to check in or register with the main entrance as you have in the past but will complete your check-in in the cancer center.  Wear comfortable clothing and clothing appropriate for easy access to any Portacath or PICC line.   We strive to give you quality time with your provider. You may need to reschedule your appointment if you arrive late (15 or more minutes).  Arriving late affects you and other patients whose appointments are after yours.  Also, if you miss three or more appointments without notifying the office, you may be dismissed from the clinic at the provider's discretion.      For prescription refill requests, have your pharmacy contact our office and allow 72 hours for refills to be completed.    Today you received the following :  Retacrit.  Epoetin Alfa Injection What is this medication? EPOETIN ALFA (e POE e tin AL fa) treats low levels of red blood cells (anemia) caused by kidney disease, chemotherapy, or HIV medications. It can also be used in people who are at risk for blood loss during surgery. It works by Systems analyst make more red blood cells, which reduces the need for blood transfusions. This medicine may be used for other purposes; ask your health care provider or pharmacist if you have questions. COMMON BRAND NAME(S): Epogen, Procrit, Retacrit What should I tell my care team before I take this medication? They need to know if you have any of these conditions: Blood clots Cancer Heart disease High blood pressure On dialysis Seizures Stroke An unusual or allergic reaction to epoetin alfa, albumin, benzyl alcohol, other  medications, foods, dyes, or preservatives Pregnant or trying to get pregnant Breast-feeding How should I use this medication? This medication is injected into a vein or under the skin. It is usually given by your care team in a hospital or clinic setting. It may also be given at home. If you get this medication at home, you will be taught how to prepare and give it. Use exactly as directed. Take it as directed on the prescription label at the same time every day. Keep taking it unless your care team tells you to stop. It is important that you put your used needles and syringes in a special sharps container. Do not put them in a trash can. If you do not have a sharps container, call your pharmacist or care team to get one. A special MedGuide will be given to you by the pharmacist with each prescription and refill. Be sure to read this information carefully each time. Talk to your care team about the use of this medication in children. While this medication may be used in children as young as 1 month of age for selected conditions, precautions do apply. Overdosage: If you think you have taken too much of this medicine contact a poison control center or emergency room at once. NOTE: This medicine is only for you. Do not share this medicine with others. What if I miss a dose? If you miss a dose, take it as soon as you can. If it is almost time for your  next dose, take only that dose. Do not take double or extra doses. What may interact with this medication? Darbepoetin alfa Methoxy polyethylene glycol-epoetin beta This list may not describe all possible interactions. Give your health care provider a list of all the medicines, herbs, non-prescription drugs, or dietary supplements you use. Also tell them if you smoke, drink alcohol, or use illegal drugs. Some items may interact with your medicine. What should I watch for while using this medication? Visit your care team for regular checks on your  progress. Check your blood pressure as directed. Know what your blood pressure should be and when to contact your care team. Your condition will be monitored carefully while you are receiving this medication. You may need blood work while taking this medication. What side effects may I notice from receiving this medication? Side effects that you should report to your care team as soon as possible: Allergic reactions--skin rash, itching, hives, swelling of the face, lips, tongue, or throat Blood clot--pain, swelling, or warmth in the leg, shortness of breath, chest pain Heart attack--pain or tightness in the chest, shoulders, arms, or jaw, nausea, shortness of breath, cold or clammy skin, feeling faint or lightheaded Increase in blood pressure Rash, fever, and swollen lymph nodes Redness, blistering, peeling, or loosening of the skin, including inside the mouth Seizures Stroke--sudden numbness or weakness of the face, arm, or leg, trouble speaking, confusion, trouble walking, loss of balance or coordination, dizziness, severe headache, change in vision Side effects that usually do not require medical attention (report to your care team if they continue or are bothersome): Bone, joint, or muscle pain Cough Headache Nausea Pain, redness, or irritation at injection site This list may not describe all possible side effects. Call your doctor for medical advice about side effects. You may report side effects to FDA at 1-800-FDA-1088. Where should I keep my medication? Keep out of the reach of children and pets. Store in a refrigerator. Do not freeze. Do not shake. Protect from light. Keep this medication in the original container until you are ready to take it. See product for storage information. Get rid of any unused medication after the expiration date. To get rid of medications that are no longer needed or have expired: Take the medication to a medication take-back program. Check with your  pharmacy or law enforcement to find a location. If you cannot return the medication, ask your pharmacist or care team how to get rid of the medication safely. NOTE: This sheet is a summary. It may not cover all possible information. If you have questions about this medicine, talk to your doctor, pharmacist, or health care provider.  2024 Elsevier/Gold Standard (2021-07-11 00:00:00)    To help prevent nausea and vomiting after your treatment, we encourage you to take your nausea medication as directed.  BELOW ARE SYMPTOMS THAT SHOULD BE REPORTED IMMEDIATELY: *FEVER GREATER THAN 100.4 F (38 C) OR HIGHER *CHILLS OR SWEATING *NAUSEA AND VOMITING THAT IS NOT CONTROLLED WITH YOUR NAUSEA MEDICATION *UNUSUAL SHORTNESS OF BREATH *UNUSUAL BRUISING OR BLEEDING *URINARY PROBLEMS (pain or burning when urinating, or frequent urination) *BOWEL PROBLEMS (unusual diarrhea, constipation, pain near the anus) TENDERNESS IN MOUTH AND THROAT WITH OR WITHOUT PRESENCE OF ULCERS (sore throat, sores in mouth, or a toothache) UNUSUAL RASH, SWELLING OR PAIN  UNUSUAL VAGINAL DISCHARGE OR ITCHING   Items with * indicate a potential emergency and should be followed up as soon as possible or go to the Emergency Department if any problems should  occur.  Please show the CHEMOTHERAPY ALERT CARD or IMMUNOTHERAPY ALERT CARD at check-in to the Emergency Department and triage nurse.  Should you have questions after your visit or need to cancel or reschedule your appointment, please contact [facility] [place] - A DEPT OF [facility] [phone]  and follow the prompts.  Office hours are 8:00 a.m. to 4:30 p.m. Monday - Friday. Please note that voicemails left after 4:00 p.m. may not be returned until the following business day.  We are closed weekends and major holidays. You have access to a nurse at all times for urgent questions. Please call the main number to the clinic [phone] and follow the  prompts.  For any non-urgent questions, you may also contact your provider using MyChart. We now offer e-Visits for anyone 73 and older to request care online for non-urgent symptoms. For details visit [email or web address].   Also download the MyChart app! Go to the app store, search "MyChart", open the app, select [place], and log in with your MyChart username and password.

## 2023-05-24 ENCOUNTER — Other Ambulatory Visit: Payer: Self-pay | Admitting: Physician Assistant

## 2023-05-24 DIAGNOSIS — D631 Anemia in chronic kidney disease: Secondary | ICD-10-CM

## 2023-05-25 DIAGNOSIS — E1122 Type 2 diabetes mellitus with diabetic chronic kidney disease: Secondary | ICD-10-CM | POA: Diagnosis not present

## 2023-05-25 DIAGNOSIS — E1129 Type 2 diabetes mellitus with other diabetic kidney complication: Secondary | ICD-10-CM | POA: Diagnosis not present

## 2023-05-25 DIAGNOSIS — R809 Proteinuria, unspecified: Secondary | ICD-10-CM | POA: Diagnosis not present

## 2023-05-25 DIAGNOSIS — N185 Chronic kidney disease, stage 5: Secondary | ICD-10-CM | POA: Diagnosis not present

## 2023-05-25 NOTE — Progress Notes (Unsigned)
 [facility] [address], [place] [address]   CLINIC:  Medical Oncology/Hematology  PCP:  Richardean Chimera, MD [address] [place] [address] [phone]   REASON FOR VISIT:  Follow-up for normocytic anemia  CURRENT THERAPY: IV iron, B12, Rectrit  INTERVAL HISTORY:   Devin [name] 78 y.o. male returns for routine follow-up of normocytic anemia.  He was last seen by Rojelio Brenner PA-C on 02/24/2023.    He has not yet needed to start dialysis, although labs from 05/21/2023 showed worsening kidney function with creatinine 5.21/GFR 10.  Patient reports that his nephrologist is aware, and that they are going to "see how the next 6 months ago" before deciding about starting dialysis.  Patient reports that he is still making urine, and he denies any increased fluid overload or peripheral edema.  He continues to tolerate Retacrit injections well without any symptoms of DVT/PE or significant increases in blood pressure.  (BP today is 166/59, which is about his usual baseline.)  He continues to deny any rectal bleeding, melena, epistaxis, or other sources of blood loss.  He reports slightly creased energy over the past month.   He denies any abnormal headaches, chest pain, dyspnea, lightheadedness, or syncope.  He is taking daily iron pill.  He is taking B12 every other day. He has 75% energy and 100% appetite. He endorses that he is maintaining a stable weight.  ASSESSMENT & PLAN:  1.  Normocytic anemia, secondary to ESRD and iron deficiency - Patient has required intermittent IV iron (received 1 g parenteral iron at [facility] in June 2023).  Most recently received Feraheme x 1 on 03/25/2022. - Stool cards positive for blood.  EGD/colonoscopy on 05/26/2022 revealed gastric polyps with stigmata of recent bleeding, colon polyps, nonbleeding internal hemorrhoids, and diverticulosis - He follows with Dr. Wolfgang Phoenix for CKD stage V / ESRD. - ESA therapy with Retacrit initiated on  07/23/2022.  Current dose is Retacrit 10,000 units every 6 weeks.  - Immunofixation shows polyclonal increase in immunoglobulins.  Light chain ratio elevated at 2.73 (kappa 216.5, lambda 79.2).  SPEP negative for M spike in December 2023. - Labs from 05/21/2023 showed significant drop in hemoglobin (Hgb 8.9).  Given Retacrit injection same day (05/21/2023), prior to that he had not required Retacrit injections since 01/13/2023. - Additional labs (05/21/2023): Creatinine 5.21/GFR 10 (CKD stage V), ferritin 378, iron saturation 23%.   - Repeat CBC/D today (05/26/2023): Hgb slightly improved at 9.3 - Denies any bleeding per rectum or melena.  Denies any ice pica. - DIFFERENTIAL DIAGNOSIS favors combination anemia from CKD, relative iron deficiency, and B12 deficiency - PLAN: Continue Retacrit 10,000 units, but INCREASE frequency to every 2 weeks - Recommend the patient hold iron pill for the time being due to elevated ferritin - Repeat CBC, CMP, ferritin, iron/TIBC with office visit in 2 months   2.  B12 deficiency: - Labs from 11/28/2021 show low vitamin B12 at 177, with MMA elevated at 562 - Patient is taking vitamin B12 1000 mcg  every other day - Most recent labs (05/21/2023): Normal B12 673, MMA normal. - PLAN: Continue vitamin B12 1000 mcg EVERY OTHER DAY.    Recheck B12/MMA annually (February 2026)  3.  CKD stage V - Follows with Dr. Wolfgang Phoenix - Left brachiocephalic AV fistula placed on 8/65/7846 - He has progressive renal insufficiency, but not yet on dialysis   4.  Social/family history: - Other PMH: Hypertension, diabetes, arthritis, CKD stage  V - He is a retired Producer, television/film/video and is independent of ADLs and IADLs.  Quit smoking cigarettes 30 years ago. - Father had prostate cancer.   PLAN SUMMARY:  >> CBC + Retacrit every 2 weeks  >> Same-day labs (CBC/D, CMP, ferritin, iron/TIBC) + OFFICE visit + Retacrit INJECTION in 2 months    REVIEW OF SYSTEMS:  Review of Systems   Constitutional:  Positive for fatigue (slightly increased fatigue). Negative for appetite change, chills, diaphoresis, fever and unexpected weight change.  HENT:   Negative for lump/mass and nosebleeds.   Eyes:  Negative for eye problems.  Respiratory:  Negative for cough, hemoptysis and shortness of breath.   Cardiovascular:  Negative for chest pain, leg swelling and palpitations.  Gastrointestinal:  Negative for abdominal pain, blood in stool, constipation, diarrhea, nausea and vomiting.  Genitourinary:  Negative for hematuria.   Skin: Negative.   Neurological:  Negative for dizziness, headaches and light-headedness.  Hematological:  Does not bruise/bleed easily.     PHYSICAL EXAM:  ECOG PERFORMANCE STATUS: 0 - Asymptomatic  Vitals:   05/26/23 0936  BP: (!) 166/59  Pulse: 60  Resp: 18  Temp: 98.2 F (36.8 C)  SpO2: 100%     Filed Weights   05/26/23 0936  Weight: 215 lb 9.6 oz (97.8 kg)     Physical Exam Constitutional:      Appearance: Normal appearance. He is obese.  Cardiovascular:     Heart sounds: Normal heart sounds.  Pulmonary:     Breath sounds: Normal breath sounds.  Neurological:     General: No focal deficit present.     Mental Status: Mental status is at baseline.  Psychiatric:        Behavior: Behavior normal. Behavior is cooperative.     PAST MEDICAL/SURGICAL HISTORY:  Past Medical History:  Diagnosis Date   Arthritis    CKD (chronic kidney disease) stage 4, GFR 15-29 ml/min (HCC)    Diabetes mellitus without complication (HCC)    Gout    Hypertension    Past Surgical History:  Procedure Laterality Date   AV FISTULA PLACEMENT Left 12/16/2021   Procedure: LEFT ARM ARTERIOVENOUS (AV) FISTULA CREATION;  Surgeon: Larina Earthly, MD;  Location: [facility];  Service: Vascular;  Laterality: Left;   BIOPSY  05/26/2022   Procedure: BIOPSY;  Surgeon: Dolores Frame, MD;  Location: [facility];  Service: Gastroenterology;;   COLONOSCOPY WITH  PROPOFOL N/A 05/26/2022   Procedure: COLONOSCOPY WITH PROPOFOL;  Surgeon: Dolores Frame, MD;  Location: [facility];  Service: Gastroenterology;  Laterality: N/A;  200pm, asa 3   ESOPHAGOGASTRODUODENOSCOPY (EGD) WITH PROPOFOL N/A 05/26/2022   Procedure: ESOPHAGOGASTRODUODENOSCOPY (EGD) WITH PROPOFOL;  Surgeon: Dolores Frame, MD;  Location: [facility];  Service: Gastroenterology;  Laterality: N/A;   HEMOSTASIS CLIP PLACEMENT  05/26/2022   Procedure: HEMOSTASIS CLIP PLACEMENT;  Surgeon: Dolores Frame, MD;  Location: [facility];  Service: Gastroenterology;;   HYDROCELE EXCISION Right 04/15/2023   Procedure: HYDROCELECTOMY ADULT;  Surgeon: Malen Gauze, MD;  Location: [facility];  Service: Urology;  Laterality: Right;   KNEE SURGERY     POLYPECTOMY  05/26/2022   Procedure: POLYPECTOMY;  Surgeon: Dolores Frame, MD;  Location: [facility];  Service: Gastroenterology;;   TONSILLECTOMY      SOCIAL HISTORY:  Social History   Socioeconomic History   Marital status: Married    Spouse name: Not on file   Number of children: Not  on file   Years of education: Not on file   Highest education level: Not on file  Occupational History   Not on file  Tobacco Use   Smoking status: Former    Current packs/day: 0.00    Types: Cigarettes    Start date: 33    Quit date: 81    Years since quitting: 32.1    Passive exposure: Past   Smokeless tobacco: Never  Vaping Use   Vaping status: Never Used  Substance and Sexual Activity   Alcohol use: Never   Drug use: Never   Sexual activity: Yes  Other Topics Concern   Not on file  Social History Narrative   Not on file   Social Drivers of Health   Financial Resource Strain: Not on file  Food Insecurity: Not on file  Transportation Needs: Not on file  Physical Activity: Not on file  Stress: Not on file  Social Connections: Not on file  Intimate Partner Violence: Not on file    FAMILY  HISTORY:  Family History  Problem Relation Age of Onset   Cancer Father    Stroke Sister    Kidney disease Sister    Heart disease Brother     CURRENT MEDICATIONS:  Outpatient Encounter Medications as of 05/26/2023  Medication Sig   amLODipine (NORVASC) 10 MG tablet Take 10 mg by mouth daily.   aspirin EC 81 MG tablet Take 81 mg by mouth daily. Swallow whole.   carvedilol (COREG) 12.5 MG tablet Take 12.5 mg by mouth 2 (two) times daily with a meal.   Cholecalciferol (VITAMIN D) 50 MCG (2000 UT) CAPS Take 2,000 Units by mouth daily.   cloNIDine (CATAPRES) 0.3 MG tablet Take 0.3 mg by mouth in the morning, at noon, and at bedtime.   cyanocobalamin (VITAMIN B12) 1000 MCG tablet Take 1,000 mcg by mouth once a week.   ferrous sulfate 325 (65 FE) MG tablet Take 1 tablet (325 mg total) by mouth 2 (two) times daily with a meal. (Patient taking differently: Take 325 mg by mouth daily.)   furosemide (LASIX) 40 MG tablet Take 40 mg by mouth 2 (two) times daily.   hydrALAZINE (APRESOLINE) 100 MG tablet Take 100 mg by mouth in the morning, at noon, and at bedtime.   HYDROcodone-acetaminophen (NORCO) 5-325 MG tablet Take 1 tablet by mouth every 4 (four) hours as needed for moderate pain (pain score 4-6).   NIFEdipine (PROCARDIA XL/NIFEDICAL-XL) 90 MG 24 hr tablet Take 90 mg by mouth daily.   simvastatin (ZOCOR) 20 MG tablet Take 20 mg by mouth daily.   pantoprazole (PROTONIX) 40 MG tablet Take 1 tablet (40 mg total) by mouth 2 (two) times daily for 60 days, THEN 1 tablet (40 mg total) daily.   No facility-administered encounter medications on file as of 05/26/2023.    ALLERGIES:  No Known Allergies  LABORATORY DATA:  I have reviewed the labs as listed.  CBC    Component Value Date/Time   WBC 10.6 (H) 05/26/2023 0754   RBC 3.11 (L) 05/26/2023 0754   HGB 9.3 (L) 05/26/2023 0754   HCT 29.5 (L) 05/26/2023 0754   PLT 298 05/26/2023 0754   MCV 94.9 05/26/2023 0754   MCH 29.9 05/26/2023 0754    MCHC 31.5 05/26/2023 0754   RDW 14.1 05/26/2023 0754   LYMPHSABS 1.9 05/21/2023 0949   MONOABS 0.6 05/21/2023 0949   EOSABS 0.4 05/21/2023 0949   BASOSABS 0.0 05/21/2023 0949  Latest Ref Rng & Units 05/21/2023    9:49 AM 02/24/2023    8:00 AM 11/26/2022   12:07 PM  CMP  Glucose 70 - 99 mg/dL 027  253  664   BUN 8 - 23 mg/dL 59  46  52   Creatinine 0.61 - 1.24 mg/dL 4.03  4.74  2.59   Sodium 135 - 145 mmol/L 138  141  141   Potassium 3.5 - 5.1 mmol/L 4.2  4.0  4.3   Chloride 98 - 111 mmol/L 108  109  107   CO2 22 - 32 mmol/L 19  20  21    Calcium 8.9 - 10.3 mg/dL 8.6  9.0  8.4   Total Protein 6.5 - 8.1 g/dL 8.1  8.4  7.9   Total Bilirubin 0.0 - 1.2 mg/dL 0.5  0.4  0.7   Alkaline Phos 38 - 126 U/L 73  75  70   AST 15 - 41 U/L 15  16  16    ALT 0 - 44 U/L 12  12  13      DIAGNOSTIC IMAGING:  I have independently reviewed the relevant imaging and discussed with the patient.   WRAP UP:  All questions were answered. The patient knows to call the clinic with any problems, questions or concerns.  Medical decision making: Moderate  Time spent on visit: I spent 20 minutes counseling the patient face to face. The total time spent in the appointment was 30 minutes and more than 50% was on counseling.  Carnella Guadalajara, PA-C  05/26/23 10:14 AM

## 2023-05-26 ENCOUNTER — Inpatient Hospital Stay

## 2023-05-26 ENCOUNTER — Inpatient Hospital Stay: Payer: Medicare Other | Attending: Hematology | Admitting: Physician Assistant

## 2023-05-26 VITALS — BP 166/59 | HR 60 | Temp 98.2°F | Resp 18 | Ht 69.0 in | Wt 215.6 lb

## 2023-05-26 DIAGNOSIS — E538 Deficiency of other specified B group vitamins: Secondary | ICD-10-CM | POA: Diagnosis not present

## 2023-05-26 DIAGNOSIS — D509 Iron deficiency anemia, unspecified: Secondary | ICD-10-CM | POA: Diagnosis not present

## 2023-05-26 DIAGNOSIS — Z7982 Long term (current) use of aspirin: Secondary | ICD-10-CM | POA: Diagnosis not present

## 2023-05-26 DIAGNOSIS — Z87891 Personal history of nicotine dependence: Secondary | ICD-10-CM | POA: Diagnosis not present

## 2023-05-26 DIAGNOSIS — D631 Anemia in chronic kidney disease: Secondary | ICD-10-CM

## 2023-05-26 DIAGNOSIS — E1122 Type 2 diabetes mellitus with diabetic chronic kidney disease: Secondary | ICD-10-CM | POA: Diagnosis not present

## 2023-05-26 DIAGNOSIS — I120 Hypertensive chronic kidney disease with stage 5 chronic kidney disease or end stage renal disease: Secondary | ICD-10-CM | POA: Insufficient documentation

## 2023-05-26 DIAGNOSIS — N185 Chronic kidney disease, stage 5: Secondary | ICD-10-CM | POA: Diagnosis not present

## 2023-05-26 DIAGNOSIS — Z79899 Other long term (current) drug therapy: Secondary | ICD-10-CM | POA: Insufficient documentation

## 2023-05-26 LAB — CBC
HCT: 29.5 % — ABNORMAL LOW (ref 39.0–52.0)
Hemoglobin: 9.3 g/dL — ABNORMAL LOW (ref 13.0–17.0)
MCH: 29.9 pg (ref 26.0–34.0)
MCHC: 31.5 g/dL (ref 30.0–36.0)
MCV: 94.9 fL (ref 80.0–100.0)
Platelets: 298 10*3/uL (ref 150–400)
RBC: 3.11 MIL/uL — ABNORMAL LOW (ref 4.22–5.81)
RDW: 14.1 % (ref 11.5–15.5)
WBC: 10.6 10*3/uL — ABNORMAL HIGH (ref 4.0–10.5)
nRBC: 0 % (ref 0.0–0.2)

## 2023-05-26 LAB — SAMPLE TO BLOOD BANK

## 2023-05-26 LAB — METHYLMALONIC ACID, SERUM: Methylmalonic Acid, Quantitative: 246 nmol/L (ref 0–378)

## 2023-05-26 NOTE — Patient Instructions (Signed)
 [facility] at [facility] **VISIT SUMMARY & IMPORTANT INSTRUCTIONS **   You were seen today by Rojelio Brenner PA-C for your anemia.   Your chronic kidney disease is the main cause of your anemia. Your blood levels (hemoglobin) have dropped much lower in the past month. We will INCREASE your RETACRIT INJECTION to be given once every 2 weeks Continue your Vitamin B12 EVERY OTHER DAY You can STOP taking your iron pill for now.  FOLLOW-UP APPOINTMENT: Labs and follow-up visit in 2 months  ** Thank you for trusting me with your healthcare!  I strive to provide all of my patients with quality care at each visit.  If you receive a survey for this visit, I would be so grateful to you for taking the time to provide feedback.  Thank you in advance!  ~ Alika Saladin                   Dr. Doreatha Massed   &   Rojelio Brenner, PA-C   - - - - - - - - - - - - - - - - - -    Thank you for choosing [facility] to provide your oncology and hematology care.  To afford each patient quality time with our provider, please arrive at least 15 minutes before your scheduled appointment time.   If you have a lab appointment with the Cancer Center please come in thru the Main Entrance and check in at the main information desk.  You need to re-schedule your appointment should you arrive 10 or more minutes late.  We strive to give you quality time with our providers, and arriving late affects you and other patients whose appointments are after yours.  Also, if you no show three or more times for appointments you may be dismissed from the clinic at the providers discretion.     Again, thank you for choosing [facility].  Our hope is that these requests will decrease the amount of time that you wait before being seen by our physicians.       _____________________________________________________________  Should you have questions after your  visit to [facility], please contact our office at [phone] and follow the prompts.  Our office hours are 8:00 a.m. and 4:30 p.m. Monday - Friday.  Please note that voicemails left after 4:00 p.m. may not be returned until the following business day.  We are closed weekends and major holidays.  You do have access to a nurse 24-7, just call the main number to the clinic [phone] and do not press any options, hold on the line and a nurse will answer the phone.    For prescription refill requests, have your pharmacy contact our office and allow 72 hours.

## 2023-05-28 DIAGNOSIS — N2581 Secondary hyperparathyroidism of renal origin: Secondary | ICD-10-CM | POA: Diagnosis not present

## 2023-05-28 DIAGNOSIS — N184 Chronic kidney disease, stage 4 (severe): Secondary | ICD-10-CM | POA: Diagnosis not present

## 2023-05-28 DIAGNOSIS — E1122 Type 2 diabetes mellitus with diabetic chronic kidney disease: Secondary | ICD-10-CM | POA: Diagnosis not present

## 2023-05-28 DIAGNOSIS — E782 Mixed hyperlipidemia: Secondary | ICD-10-CM | POA: Diagnosis not present

## 2023-05-28 DIAGNOSIS — Z0001 Encounter for general adult medical examination with abnormal findings: Secondary | ICD-10-CM | POA: Diagnosis not present

## 2023-06-04 ENCOUNTER — Inpatient Hospital Stay

## 2023-06-04 VITALS — BP 143/58 | HR 60 | Temp 97.0°F | Resp 18

## 2023-06-04 DIAGNOSIS — Z79899 Other long term (current) drug therapy: Secondary | ICD-10-CM | POA: Diagnosis not present

## 2023-06-04 DIAGNOSIS — N185 Chronic kidney disease, stage 5: Secondary | ICD-10-CM | POA: Diagnosis not present

## 2023-06-04 DIAGNOSIS — E1122 Type 2 diabetes mellitus with diabetic chronic kidney disease: Secondary | ICD-10-CM | POA: Diagnosis not present

## 2023-06-04 DIAGNOSIS — D631 Anemia in chronic kidney disease: Secondary | ICD-10-CM | POA: Diagnosis not present

## 2023-06-04 DIAGNOSIS — I120 Hypertensive chronic kidney disease with stage 5 chronic kidney disease or end stage renal disease: Secondary | ICD-10-CM | POA: Diagnosis not present

## 2023-06-04 DIAGNOSIS — E538 Deficiency of other specified B group vitamins: Secondary | ICD-10-CM | POA: Diagnosis not present

## 2023-06-04 DIAGNOSIS — Z87891 Personal history of nicotine dependence: Secondary | ICD-10-CM | POA: Diagnosis not present

## 2023-06-04 DIAGNOSIS — Z7982 Long term (current) use of aspirin: Secondary | ICD-10-CM | POA: Diagnosis not present

## 2023-06-04 DIAGNOSIS — D509 Iron deficiency anemia, unspecified: Secondary | ICD-10-CM

## 2023-06-04 LAB — CBC
HCT: 30.8 % — ABNORMAL LOW (ref 39.0–52.0)
Hemoglobin: 9.7 g/dL — ABNORMAL LOW (ref 13.0–17.0)
MCH: 29.8 pg (ref 26.0–34.0)
MCHC: 31.5 g/dL (ref 30.0–36.0)
MCV: 94.8 fL (ref 80.0–100.0)
Platelets: 316 10*3/uL (ref 150–400)
RBC: 3.25 MIL/uL — ABNORMAL LOW (ref 4.22–5.81)
RDW: 14.2 % (ref 11.5–15.5)
WBC: 9.9 10*3/uL (ref 4.0–10.5)
nRBC: 0 % (ref 0.0–0.2)

## 2023-06-04 MED ORDER — EPOETIN ALFA-EPBX 10000 UNIT/ML IJ SOLN
10000.0000 [IU] | Freq: Once | INTRAMUSCULAR | Status: AC
  Administered 2023-06-04: 10000 [IU] via SUBCUTANEOUS
  Filled 2023-06-04: qty 1

## 2023-06-04 NOTE — Patient Instructions (Signed)
 [facility] [place] - A DEPT OF [facility]  Discharge Instructions: Thank you for choosing [facility] to provide your oncology and hematology care.  If you have a lab appointment with the Cancer Center - please note that after April 8th, 2024, all labs will be drawn in the cancer center.  You do not have to check in or register with the main entrance as you have in the past but will complete your check-in in the cancer center.  Wear comfortable clothing and clothing appropriate for easy access to any Portacath or PICC line.   We strive to give you quality time with your provider. You may need to reschedule your appointment if you arrive late (15 or more minutes).  Arriving late affects you and other patients whose appointments are after yours.  Also, if you miss three or more appointments without notifying the office, you may be dismissed from the clinic at the provider's discretion.      For prescription refill requests, have your pharmacy contact our office and allow 72 hours for refills to be completed.    Today you received the following chemotherapy and/or immunotherapy agents retacrit      To help prevent nausea and vomiting after your treatment, we encourage you to take your nausea medication as directed.  BELOW ARE SYMPTOMS THAT SHOULD BE REPORTED IMMEDIATELY: *FEVER GREATER THAN 100.4 F (38 C) OR HIGHER *CHILLS OR SWEATING *NAUSEA AND VOMITING THAT IS NOT CONTROLLED WITH YOUR NAUSEA MEDICATION *UNUSUAL SHORTNESS OF BREATH *UNUSUAL BRUISING OR BLEEDING *URINARY PROBLEMS (pain or burning when urinating, or frequent urination) *BOWEL PROBLEMS (unusual diarrhea, constipation, pain near the anus) TENDERNESS IN MOUTH AND THROAT WITH OR WITHOUT PRESENCE OF ULCERS (sore throat, sores in mouth, or a toothache) UNUSUAL RASH, SWELLING OR PAIN  UNUSUAL VAGINAL DISCHARGE OR ITCHING   Items with * indicate a potential emergency and should be followed up  as soon as possible or go to the Emergency Department if any problems should occur.  Please show the CHEMOTHERAPY ALERT CARD or IMMUNOTHERAPY ALERT CARD at check-in to the Emergency Department and triage nurse.  Should you have questions after your visit or need to cancel or reschedule your appointment, please contact [facility]  - A DEPT OF [facility] [phone]  and follow the prompts.  Office hours are 8:00 a.m. to 4:30 p.m. Monday - Friday. Please note that voicemails left after 4:00 p.m. may not be returned until the following business day.  We are closed weekends and major holidays. You have access to a nurse at all times for urgent questions. Please call the main number to the clinic [phone] and follow the prompts.  For any non-urgent questions, you may also contact your provider using MyChart. We now offer e-Visits for anyone 81 and older to request care online for non-urgent symptoms. For details visit [email or web address].   Also download the MyChart app! Go to the app store, search "MyChart", open the app, select [place], and log in with your MyChart username and password.

## 2023-06-04 NOTE — Progress Notes (Signed)
 Patient presents today for Retacrit injection per providers order.  Vital signs WNL.  Patient has no new complaints at this time.  Stable during administration without incident; injection site WNL; see MAR for injection details.  Patient tolerated procedure well and without incident.  No questions or complaints noted at this time.

## 2023-06-18 ENCOUNTER — Inpatient Hospital Stay

## 2023-06-18 VITALS — BP 157/67 | HR 69 | Temp 97.9°F | Resp 16

## 2023-06-18 DIAGNOSIS — Z79899 Other long term (current) drug therapy: Secondary | ICD-10-CM | POA: Diagnosis not present

## 2023-06-18 DIAGNOSIS — D631 Anemia in chronic kidney disease: Secondary | ICD-10-CM | POA: Diagnosis not present

## 2023-06-18 DIAGNOSIS — D509 Iron deficiency anemia, unspecified: Secondary | ICD-10-CM

## 2023-06-18 DIAGNOSIS — Z87891 Personal history of nicotine dependence: Secondary | ICD-10-CM | POA: Diagnosis not present

## 2023-06-18 DIAGNOSIS — E1122 Type 2 diabetes mellitus with diabetic chronic kidney disease: Secondary | ICD-10-CM | POA: Diagnosis not present

## 2023-06-18 DIAGNOSIS — I120 Hypertensive chronic kidney disease with stage 5 chronic kidney disease or end stage renal disease: Secondary | ICD-10-CM | POA: Diagnosis not present

## 2023-06-18 DIAGNOSIS — Z7982 Long term (current) use of aspirin: Secondary | ICD-10-CM | POA: Diagnosis not present

## 2023-06-18 DIAGNOSIS — N185 Chronic kidney disease, stage 5: Secondary | ICD-10-CM | POA: Diagnosis not present

## 2023-06-18 DIAGNOSIS — E538 Deficiency of other specified B group vitamins: Secondary | ICD-10-CM | POA: Diagnosis not present

## 2023-06-18 LAB — CBC
HCT: 33.7 % — ABNORMAL LOW (ref 39.0–52.0)
Hemoglobin: 10.8 g/dL — ABNORMAL LOW (ref 13.0–17.0)
MCH: 29.8 pg (ref 26.0–34.0)
MCHC: 32 g/dL (ref 30.0–36.0)
MCV: 93.1 fL (ref 80.0–100.0)
Platelets: 262 10*3/uL (ref 150–400)
RBC: 3.62 MIL/uL — ABNORMAL LOW (ref 4.22–5.81)
RDW: 14.5 % (ref 11.5–15.5)
WBC: 8 10*3/uL (ref 4.0–10.5)
nRBC: 0 % (ref 0.0–0.2)

## 2023-06-18 MED ORDER — EPOETIN ALFA-EPBX 10000 UNIT/ML IJ SOLN
10000.0000 [IU] | Freq: Once | INTRAMUSCULAR | Status: AC
  Administered 2023-06-18: 10000 [IU] via SUBCUTANEOUS
  Filled 2023-06-18: qty 1

## 2023-06-18 NOTE — Progress Notes (Signed)
 Retacrit injection given per orders. Patient tolerated it well without problems. Vitals stable and discharged home from clinic ambulatory. Follow up as scheduled.

## 2023-06-18 NOTE — Patient Instructions (Signed)
 [facility] [place] [facility]  Discharge Instructions: Thank you for choosing [facility] to provide your oncology and hematology care.  If you have a lab appointment with the Cancer Center - please note that after April 8th, 2024, all labs will be drawn in the cancer center.  You do not have to check in or register with the main entrance as you have in the past but will complete your check-in in the cancer center.  Wear comfortable clothing and clothing appropriate for easy access to any Portacath or PICC line.   We strive to give you quality time with your provider. You may need to reschedule your appointment if you arrive late (15 or more minutes).  Arriving late affects you and other patients whose appointments are after yours.  Also, if you miss three or more appointments without notifying the office, you may be dismissed from the clinic at the provider's discretion.      For prescription refill requests, have your pharmacy contact our office and allow 72 hours for refills to be completed.    Today you received the following injection Retacrit   To help prevent nausea and vomiting after your treatment, we encourage you to take your nausea medication as directed.  BELOW ARE SYMPTOMS THAT SHOULD BE REPORTED IMMEDIATELY: *FEVER GREATER THAN 100.4 F (38 C) OR HIGHER *CHILLS OR SWEATING *NAUSEA AND VOMITING THAT IS NOT CONTROLLED WITH YOUR NAUSEA MEDICATION *UNUSUAL SHORTNESS OF BREATH *UNUSUAL BRUISING OR BLEEDING *URINARY PROBLEMS (pain or burning when urinating, or frequent urination) *BOWEL PROBLEMS (unusual diarrhea, constipation, pain near the anus) TENDERNESS IN MOUTH AND THROAT WITH OR WITHOUT PRESENCE OF ULCERS (sore throat, sores in mouth, or a toothache) UNUSUAL RASH, SWELLING OR PAIN  UNUSUAL VAGINAL DISCHARGE OR ITCHING   Items with * indicate a potential emergency and should be followed up as soon as possible or go to the  Emergency Department if any problems should occur.  Please show the CHEMOTHERAPY ALERT CARD or IMMUNOTHERAPY ALERT CARD at check-in to the Emergency Department and triage nurse.  Should you have questions after your visit or need to cancel or reschedule your appointment, please contact [facility] [place] - A DEPT OF [facility] [phone]  and follow the prompts.  Office hours are 8:00 a.m. to 4:30 p.m. Monday - Friday. Please note that voicemails left after 4:00 p.m. may not be returned until the following business day.  We are closed weekends and major holidays. You have access to a nurse at all times for urgent questions. Please call the main number to the clinic [phone] and follow the prompts.  For any non-urgent questions, you may also contact your provider using MyChart. We now offer e-Visits for anyone 61 and older to request care online for non-urgent symptoms. For details visit [email or web address].   Also download the MyChart app! Go to the app store, search "MyChart", open the app, select [place], and log in with your MyChart username and password.

## 2023-06-21 DIAGNOSIS — I5032 Chronic diastolic (congestive) heart failure: Secondary | ICD-10-CM | POA: Diagnosis not present

## 2023-06-21 DIAGNOSIS — I10 Essential (primary) hypertension: Secondary | ICD-10-CM | POA: Diagnosis not present

## 2023-06-21 DIAGNOSIS — E1122 Type 2 diabetes mellitus with diabetic chronic kidney disease: Secondary | ICD-10-CM | POA: Diagnosis not present

## 2023-06-21 DIAGNOSIS — E782 Mixed hyperlipidemia: Secondary | ICD-10-CM | POA: Diagnosis not present

## 2023-07-02 ENCOUNTER — Inpatient Hospital Stay

## 2023-07-02 ENCOUNTER — Inpatient Hospital Stay: Attending: Hematology

## 2023-07-02 VITALS — BP 162/62 | HR 61 | Temp 97.4°F | Resp 20

## 2023-07-02 DIAGNOSIS — D631 Anemia in chronic kidney disease: Secondary | ICD-10-CM | POA: Insufficient documentation

## 2023-07-02 DIAGNOSIS — E538 Deficiency of other specified B group vitamins: Secondary | ICD-10-CM | POA: Insufficient documentation

## 2023-07-02 DIAGNOSIS — N185 Chronic kidney disease, stage 5: Secondary | ICD-10-CM | POA: Insufficient documentation

## 2023-07-02 DIAGNOSIS — D509 Iron deficiency anemia, unspecified: Secondary | ICD-10-CM

## 2023-07-02 DIAGNOSIS — I120 Hypertensive chronic kidney disease with stage 5 chronic kidney disease or end stage renal disease: Secondary | ICD-10-CM | POA: Diagnosis not present

## 2023-07-02 LAB — CBC
HCT: 34.9 % — ABNORMAL LOW (ref 39.0–52.0)
Hemoglobin: 10.6 g/dL — ABNORMAL LOW (ref 13.0–17.0)
MCH: 29 pg (ref 26.0–34.0)
MCHC: 30.4 g/dL (ref 30.0–36.0)
MCV: 95.6 fL (ref 80.0–100.0)
Platelets: 305 10*3/uL (ref 150–400)
RBC: 3.65 MIL/uL — ABNORMAL LOW (ref 4.22–5.81)
RDW: 14.2 % (ref 11.5–15.5)
WBC: 9.9 10*3/uL (ref 4.0–10.5)
nRBC: 0 % (ref 0.0–0.2)

## 2023-07-02 MED ORDER — EPOETIN ALFA-EPBX 10000 UNIT/ML IJ SOLN
10000.0000 [IU] | Freq: Once | INTRAMUSCULAR | Status: AC
  Administered 2023-07-02: 10000 [IU] via SUBCUTANEOUS
  Filled 2023-07-02: qty 1

## 2023-07-02 NOTE — Patient Instructions (Signed)

## 2023-07-02 NOTE — Progress Notes (Signed)
Patient presents today for Retacrit injection. Hemoglobin reviewed prior to administration. VSS tolerated without incident or complaint. See MAR for details. Patient stable during and after injection. Patient discharged in satisfactory condition with no s/s of distress noted.  

## 2023-07-16 ENCOUNTER — Inpatient Hospital Stay

## 2023-07-19 NOTE — Progress Notes (Unsigned)
 [facility] [address], [place] [address]   CLINIC:  Medical Oncology/Hematology  PCP:  Leesa Pulling, MD [address] [place] [address] [phone]   REASON FOR VISIT:  Follow-up for normocytic anemia  CURRENT THERAPY: IV iron, B12, Rectrit  INTERVAL HISTORY:   Mr. Fichtel 78 y.o. male returns for routine follow-up of normocytic anemia.  He was last seen by Sheril Dines PA-C on 05/26/2023.    Following his last visit, Retacrit  injections were increased to be given every 2 weeks, instead of monthly.  He is tolerating his Retacrit  well, and Hgb has been at goal on increased frequency of Retacrit .  He denies any symptoms of VTE, and his blood pressure is at his usual baseline.   He continues to deny any rectal bleeding, melena, epistaxis, or other sources of blood loss.  He reports improved energy over the past month.   He denies any abnormal headaches, chest pain, dyspnea, lightheadedness, or syncope. He is taking B12 every other day, but has not been taking iron pill since it was held at last visit. He has 75% energy and 100% appetite.  He endorses that he is maintaining a stable weight.  He has not yet needed to start dialysis, although labs continue to show worsening kidney function (labs today show creatinine 5.95/GFR 9).    Patient reports that his nephrologist is aware, and that they are going to "see how the next 6 months ago" before deciding about starting dialysis.  Patient reports that he is still making urine, and he denies any increased fluid overload or peripheral edema.  ASSESSMENT & PLAN:  1.  Normocytic anemia, secondary to ESRD and iron deficiency - Patient has required intermittent IV iron (received 1 g parenteral iron at [facility] in June 2023).  Most recently received Feraheme  x 1 on 03/25/2022. - Stool cards positive for blood.  EGD/colonoscopy on 05/26/2022 revealed gastric polyps with stigmata of recent bleeding, colon polyps,  nonbleeding internal hemorrhoids, and diverticulosis - He follows with Dr. Carrolyn Clan for CKD stage V / ESRD. - ESA therapy with Retacrit  initiated on 07/23/2022.  Current dose is Retacrit  10,000 units every 2 weeks.  - Immunofixation shows polyclonal increase in immunoglobulins.  Light chain ratio elevated at 2.73 (kappa 216.5, lambda 79.2).  SPEP negative for M spike in December 2023. - Labs from 05/21/2023 showed significant drop in hemoglobin (Hgb 8.9).  Given Retacrit  injection same day (05/21/2023), prior to that he had not required Retacrit  injections since 01/13/2023. - Additional labs (05/21/2023): Creatinine 5.21/GFR 10 (CKD stage V), ferritin 378, iron saturation 23%. - Labs today (07/20/2023):  Hgb 11.5/MCV 93.2 Ferritin 328, iron saturation 22% Creatinine 5.95/GFR 9 - Denies any bleeding per rectum or melena.  Denies any ice pica. - DIFFERENTIAL DIAGNOSIS favors combination anemia from CKD, relative iron deficiency, and B12 deficiency - PLAN: We will DECREASE Retacrit  frequency to be given 10,000 units every 3 weeks.   - Continue to HOLD iron pill for the time being due to elevated ferritin - Repeat CBC, CMP, ferritin, iron/TIBC with office visit in about 3 months (same day as labs/INJECTION)  2.  B12 deficiency: - Labs from 11/28/2021 show low vitamin B12 at 177, with MMA elevated at 562 - Patient is taking vitamin B12 1000 mcg  every other day - Most recent labs (05/21/2023): Normal B12 673, MMA normal. - PLAN: Continue vitamin B12 1000 mcg EVERY OTHER DAY.    Recheck B12/MMA annually (February 2026)  3.  CKD stage V - Follows with Dr. Carrolyn Clan - Left brachiocephalic AV fistula placed on 10/31/9145 - He has progressive renal insufficiency, but not yet on dialysis   4.  Social/family history: - Other PMH: Hypertension, diabetes, arthritis, CKD stage V - He is a retired Producer, television/film/video and is independent of ADLs and IADLs.  Quit smoking cigarettes 30 years ago. - Father had prostate  cancer.   PLAN SUMMARY:  >> CBC + Retacrit  every 3 weeks  >> Same-day labs (CBC/D, CMP, ferritin, iron/TIBC) + OFFICE visit + Retacrit  INJECTION in about 3 months    REVIEW OF SYSTEMS:  Review of Systems  Constitutional:  Negative for appetite change, chills, diaphoresis, fatigue, fever and unexpected weight change.  HENT:   Negative for lump/mass and nosebleeds.   Eyes:  Negative for eye problems.  Respiratory:  Negative for cough, hemoptysis and shortness of breath.   Cardiovascular:  Negative for chest pain, leg swelling and palpitations.  Gastrointestinal:  Negative for abdominal pain, blood in stool, constipation, diarrhea, nausea and vomiting.  Genitourinary:  Negative for hematuria.   Skin: Negative.   Neurological:  Negative for dizziness, headaches and light-headedness.  Hematological:  Does not bruise/bleed easily.     PHYSICAL EXAM:  ECOG PERFORMANCE STATUS: 0 - Asymptomatic  Vitals:   07/20/23 1032 07/20/23 1037  BP: (!) 175/68 (!) 173/62  Pulse: 66   Resp: 20   Temp: (!) 96.7 F (35.9 C)   SpO2: 99%     Filed Weights   07/20/23 1032  Weight: 218 lb 7.6 oz (99.1 kg)    Physical Exam Constitutional:      Appearance: Normal appearance. He is obese.  Cardiovascular:     Heart sounds: Normal heart sounds.  Pulmonary:     Breath sounds: Normal breath sounds.  Neurological:     General: No focal deficit present.     Mental Status: Mental status is at baseline.  Psychiatric:        Behavior: Behavior normal. Behavior is cooperative.     PAST MEDICAL/SURGICAL HISTORY:  Past Medical History:  Diagnosis Date   Arthritis    CKD (chronic kidney disease) stage 4, GFR 15-29 ml/min (HCC)    Diabetes mellitus without complication (HCC)    Gout    Hypertension    Past Surgical History:  Procedure Laterality Date   AV FISTULA PLACEMENT Left 12/16/2021   Procedure: LEFT ARM ARTERIOVENOUS (AV) FISTULA CREATION;  Surgeon: Mayo Speck, MD;  Location: [facility];   Service: Vascular;  Laterality: Left;   BIOPSY  05/26/2022   Procedure: BIOPSY;  Surgeon: Urban Garden, MD;  Location: [facility];  Service: Gastroenterology;;   COLONOSCOPY WITH PROPOFOL  N/A 05/26/2022   Procedure: COLONOSCOPY WITH PROPOFOL ;  Surgeon: Urban Garden, MD;  Location: [facility];  Service: Gastroenterology;  Laterality: N/A;  200pm, asa 3   ESOPHAGOGASTRODUODENOSCOPY (EGD) WITH PROPOFOL  N/A 05/26/2022   Procedure: ESOPHAGOGASTRODUODENOSCOPY (EGD) WITH PROPOFOL ;  Surgeon: Urban Garden, MD;  Location: [facility];  Service: Gastroenterology;  Laterality: N/A;   HEMOSTASIS CLIP PLACEMENT  05/26/2022   Procedure: HEMOSTASIS CLIP PLACEMENT;  Surgeon: Urban Garden, MD;  Location: [facility];  Service: Gastroenterology;;   HYDROCELE EXCISION Right 04/15/2023   Procedure: HYDROCELECTOMY ADULT;  Surgeon: Marco Severs, MD;  Location: [facility];  Service: Urology;  Laterality: Right;   KNEE SURGERY     POLYPECTOMY  05/26/2022   Procedure: POLYPECTOMY;  Surgeon: Umberto Ganong,  Bearl Limes, MD;  Location: [facility];  Service: Gastroenterology;;   TONSILLECTOMY      SOCIAL HISTORY:  Social History   Socioeconomic History   Marital status: Married    Spouse name: Not on file   Number of children: Not on file   Years of education: Not on file   Highest education level: Not on file  Occupational History   Not on file  Tobacco Use   Smoking status: Former    Current packs/day: 0.00    Types: Cigarettes    Start date: 51    Quit date: 71    Years since quitting: 32.3    Passive exposure: Past   Smokeless tobacco: Never  Vaping Use   Vaping status: Never Used  Substance and Sexual Activity   Alcohol use: Never   Drug use: Never   Sexual activity: Yes  Other Topics Concern   Not on file  Social History Narrative   Not on file   Social Drivers of Health   Financial Resource Strain: Not on file  Food Insecurity:  Not on file  Transportation Needs: Not on file  Physical Activity: Not on file  Stress: Not on file  Social Connections: Not on file  Intimate Partner Violence: Not on file    FAMILY HISTORY:  Family History  Problem Relation Age of Onset   Cancer Father    Stroke Sister    Kidney disease Sister    Heart disease Brother     CURRENT MEDICATIONS:  Outpatient Encounter Medications as of 07/20/2023  Medication Sig   amLODipine  (NORVASC ) 10 MG tablet Take 10 mg by mouth daily.   aspirin  EC 81 MG tablet Take 81 mg by mouth daily. Swallow whole.   carvedilol  (COREG ) 12.5 MG tablet Take 12.5 mg by mouth 2 (two) times daily with a meal.   Cholecalciferol (VITAMIN D) 50 MCG (2000 UT) CAPS Take 2,000 Units by mouth daily.   cloNIDine  (CATAPRES ) 0.3 MG tablet Take 0.3 mg by mouth in the morning, at noon, and at bedtime.   cyanocobalamin  (VITAMIN B12) 1000 MCG tablet Take 1,000 mcg by mouth once a week.   ferrous sulfate  325 (65 FE) MG tablet Take 1 tablet (325 mg total) by mouth 2 (two) times daily with a meal.   furosemide  (LASIX ) 40 MG tablet Take 40 mg by mouth 2 (two) times daily.   hydrALAZINE  (APRESOLINE ) 100 MG tablet Take 100 mg by mouth in the morning, at noon, and at bedtime.   NIFEdipine (PROCARDIA XL/NIFEDICAL-XL) 90 MG 24 hr tablet Take 90 mg by mouth daily.   simvastatin  (ZOCOR ) 20 MG tablet Take 20 mg by mouth daily.   pantoprazole  (PROTONIX ) 40 MG tablet Take 1 tablet (40 mg total) by mouth 2 (two) times daily for 60 days, THEN 1 tablet (40 mg total) daily.   No facility-administered encounter medications on file as of 07/20/2023.    ALLERGIES:  No Known Allergies  LABORATORY DATA:  I have reviewed the labs as listed.  CBC    Component Value Date/Time   WBC 7.6 07/20/2023 0858   RBC 3.96 (L) 07/20/2023 0858   HGB 11.5 (L) 07/20/2023 0858   HCT 36.9 (L) 07/20/2023 0858   PLT 286 07/20/2023 0858   MCV 93.2 07/20/2023 0858   MCH 29.0 07/20/2023 0858   MCHC 31.2  07/20/2023 0858   RDW 13.9 07/20/2023 0858   LYMPHSABS 1.5 07/20/2023 0858   MONOABS 0.6 07/20/2023 0858   EOSABS 0.4 07/20/2023  0858   BASOSABS 0.1 07/20/2023 0858      Latest Ref Rng & Units 07/20/2023    8:58 AM 05/21/2023    9:49 AM 02/24/2023    8:00 AM  CMP  Glucose 70 - 99 mg/dL 161  096  045   BUN 8 - 23 mg/dL 60  59  46   Creatinine 0.61 - 1.24 mg/dL 4.09  8.11  9.14   Sodium 135 - 145 mmol/L 138  138  141   Potassium 3.5 - 5.1 mmol/L 4.0  4.2  4.0   Chloride 98 - 111 mmol/L 108  108  109   CO2 22 - 32 mmol/L 18  19  20    Calcium 8.9 - 10.3 mg/dL 8.8  8.6  9.0   Total Protein 6.5 - 8.1 g/dL 8.5  8.1  8.4   Total Bilirubin 0.0 - 1.2 mg/dL 0.2  0.5  0.4   Alkaline Phos 38 - 126 U/L 80  73  75   AST 15 - 41 U/L 15  15  16    ALT 0 - 44 U/L 10  12  12      DIAGNOSTIC IMAGING:  I have independently reviewed the relevant imaging and discussed with the patient.   WRAP UP:  All questions were answered. The patient knows to call the clinic with any problems, questions or concerns.  Medical decision making: Moderate  Time spent on visit: I spent 20 minutes counseling the patient face to face. The total time spent in the appointment was 30 minutes and more than 50% was on counseling.  Sonnie Dusky, PA-C 07/20/23 11:20 AM

## 2023-07-20 ENCOUNTER — Inpatient Hospital Stay

## 2023-07-20 ENCOUNTER — Inpatient Hospital Stay: Admitting: Physician Assistant

## 2023-07-20 VITALS — BP 173/62 | HR 66 | Temp 96.7°F | Resp 20 | Wt 218.5 lb

## 2023-07-20 DIAGNOSIS — D631 Anemia in chronic kidney disease: Secondary | ICD-10-CM | POA: Diagnosis not present

## 2023-07-20 DIAGNOSIS — N185 Chronic kidney disease, stage 5: Secondary | ICD-10-CM

## 2023-07-20 DIAGNOSIS — I120 Hypertensive chronic kidney disease with stage 5 chronic kidney disease or end stage renal disease: Secondary | ICD-10-CM | POA: Diagnosis not present

## 2023-07-20 DIAGNOSIS — D509 Iron deficiency anemia, unspecified: Secondary | ICD-10-CM | POA: Diagnosis not present

## 2023-07-20 DIAGNOSIS — E538 Deficiency of other specified B group vitamins: Secondary | ICD-10-CM

## 2023-07-20 LAB — COMPREHENSIVE METABOLIC PANEL WITH GFR
ALT: 10 U/L (ref 0–44)
AST: 15 U/L (ref 15–41)
Albumin: 4.2 g/dL (ref 3.5–5.0)
Alkaline Phosphatase: 80 U/L (ref 38–126)
Anion gap: 12 (ref 5–15)
BUN: 60 mg/dL — ABNORMAL HIGH (ref 8–23)
CO2: 18 mmol/L — ABNORMAL LOW (ref 22–32)
Calcium: 8.8 mg/dL — ABNORMAL LOW (ref 8.9–10.3)
Chloride: 108 mmol/L (ref 98–111)
Creatinine, Ser: 5.95 mg/dL — ABNORMAL HIGH (ref 0.61–1.24)
GFR, Estimated: 9 mL/min — ABNORMAL LOW (ref >60)
Glucose, Bld: 146 mg/dL — ABNORMAL HIGH (ref 70–99)
Potassium: 4 mmol/L (ref 3.5–5.1)
Sodium: 138 mmol/L (ref 135–145)
Total Bilirubin: 0.2 mg/dL (ref 0.0–1.2)
Total Protein: 8.5 g/dL — ABNORMAL HIGH (ref 6.5–8.1)

## 2023-07-20 LAB — IRON AND TIBC
Iron: 63 ug/dL (ref 45–182)
Saturation Ratios: 22 % (ref 17.9–39.5)
TIBC: 284 ug/dL (ref 250–450)
UIBC: 221 ug/dL

## 2023-07-20 LAB — CBC WITH DIFFERENTIAL/PLATELET
Abs Immature Granulocytes: 0.02 10*3/uL (ref 0.00–0.07)
Basophils Absolute: 0.1 10*3/uL (ref 0.0–0.1)
Basophils Relative: 1 %
Eosinophils Absolute: 0.4 10*3/uL (ref 0.0–0.5)
Eosinophils Relative: 5 %
HCT: 36.9 % — ABNORMAL LOW (ref 39.0–52.0)
Hemoglobin: 11.5 g/dL — ABNORMAL LOW (ref 13.0–17.0)
Immature Granulocytes: 0 %
Lymphocytes Relative: 20 %
Lymphs Abs: 1.5 10*3/uL (ref 0.7–4.0)
MCH: 29 pg (ref 26.0–34.0)
MCHC: 31.2 g/dL (ref 30.0–36.0)
MCV: 93.2 fL (ref 80.0–100.0)
Monocytes Absolute: 0.6 10*3/uL (ref 0.1–1.0)
Monocytes Relative: 8 %
Neutro Abs: 5 10*3/uL (ref 1.7–7.7)
Neutrophils Relative %: 66 %
Platelets: 286 10*3/uL (ref 150–400)
RBC: 3.96 MIL/uL — ABNORMAL LOW (ref 4.22–5.81)
RDW: 13.9 % (ref 11.5–15.5)
WBC: 7.6 10*3/uL (ref 4.0–10.5)
nRBC: 0 % (ref 0.0–0.2)

## 2023-07-20 LAB — FERRITIN: Ferritin: 328 ng/mL (ref 24–336)

## 2023-07-20 NOTE — Progress Notes (Signed)
 Patient's Hgb 11.5. Per MD orders to hold Retacrit  injection today. Pt updated. All follow ups as scheduled.   Devin [name]

## 2023-07-20 NOTE — Patient Instructions (Signed)
 [facility] at [facility] **VISIT SUMMARY & IMPORTANT INSTRUCTIONS **   You were seen today by Sheril Dines PA-C for your anemia.   Your chronic kidney disease is the main cause of your anemia. Your blood levels (hemoglobin) look much better than at your last visit! We will DECREASE your RETACRIT  INJECTION to be given once every 3 weeks Continue your Vitamin B12 EVERY OTHER DAY You can STOP taking your iron pill for now.  FOLLOW-UP APPOINTMENT: Labs and follow-up visit in 3 months  ** Thank you for trusting me with your healthcare!  I strive to provide all of my patients with quality care at each visit.  If you receive a survey for this visit, I would be so grateful to you for taking the time to provide feedback.  Thank you in advance!  ~ Daveda Larock                   Dr. Paulett Boros   &   Sheril Dines, PA-C   - - - - - - - - - - - - - - - - - -    Thank you for choosing [facility] at [facility] to provide your oncology and hematology care.  To afford each patient quality time with our provider, please arrive at least 15 minutes before your scheduled appointment time.   If you have a lab appointment with the Cancer Center please come in thru the Main Entrance and check in at the main information desk.  You need to re-schedule your appointment should you arrive 10 or more minutes late.  We strive to give you quality time with our providers, and arriving late affects you and other patients whose appointments are after yours.  Also, if you no show three or more times for appointments you may be dismissed from the clinic at the providers discretion.     Again, thank you for choosing [facility]  Our hope is that these requests will decrease the amount of time that you wait before being seen by our physicians.       _____________________________________________________________  Should you have questions after your  visit to [facility], please contact our office at [phone] and follow the prompts.  Our office hours are 8:00 a.m. and 4:30 p.m. Monday - Friday.  Please note that voicemails left after 4:00 p.m. may not be returned until the following business day.  We are closed weekends and major holidays.  You do have access to a nurse 24-7, just call the main number to the clinic [phone] and do not press any options, hold on the line and a nurse will answer the phone.    For prescription refill requests, have your pharmacy contact our office and allow 72 hours.

## 2023-07-21 ENCOUNTER — Inpatient Hospital Stay: Admitting: Physician Assistant

## 2023-07-21 DIAGNOSIS — E1122 Type 2 diabetes mellitus with diabetic chronic kidney disease: Secondary | ICD-10-CM | POA: Diagnosis not present

## 2023-07-21 DIAGNOSIS — E782 Mixed hyperlipidemia: Secondary | ICD-10-CM | POA: Diagnosis not present

## 2023-07-21 DIAGNOSIS — I5032 Chronic diastolic (congestive) heart failure: Secondary | ICD-10-CM | POA: Diagnosis not present

## 2023-07-21 DIAGNOSIS — I10 Essential (primary) hypertension: Secondary | ICD-10-CM | POA: Diagnosis not present

## 2023-07-27 DIAGNOSIS — Z1329 Encounter for screening for other suspected endocrine disorder: Secondary | ICD-10-CM | POA: Diagnosis not present

## 2023-07-27 DIAGNOSIS — N183 Chronic kidney disease, stage 3 unspecified: Secondary | ICD-10-CM | POA: Diagnosis not present

## 2023-07-27 DIAGNOSIS — D631 Anemia in chronic kidney disease: Secondary | ICD-10-CM | POA: Diagnosis not present

## 2023-07-27 DIAGNOSIS — R5382 Chronic fatigue, unspecified: Secondary | ICD-10-CM | POA: Diagnosis not present

## 2023-07-27 DIAGNOSIS — Z0001 Encounter for general adult medical examination with abnormal findings: Secondary | ICD-10-CM | POA: Diagnosis not present

## 2023-07-27 DIAGNOSIS — I10 Essential (primary) hypertension: Secondary | ICD-10-CM | POA: Diagnosis not present

## 2023-08-05 DIAGNOSIS — R809 Proteinuria, unspecified: Secondary | ICD-10-CM | POA: Diagnosis not present

## 2023-08-05 DIAGNOSIS — E1129 Type 2 diabetes mellitus with other diabetic kidney complication: Secondary | ICD-10-CM | POA: Diagnosis not present

## 2023-08-05 DIAGNOSIS — N185 Chronic kidney disease, stage 5: Secondary | ICD-10-CM | POA: Diagnosis not present

## 2023-08-05 DIAGNOSIS — E1122 Type 2 diabetes mellitus with diabetic chronic kidney disease: Secondary | ICD-10-CM | POA: Diagnosis not present

## 2023-08-09 ENCOUNTER — Inpatient Hospital Stay: Attending: Hematology

## 2023-08-09 ENCOUNTER — Inpatient Hospital Stay

## 2023-08-09 DIAGNOSIS — I120 Hypertensive chronic kidney disease with stage 5 chronic kidney disease or end stage renal disease: Secondary | ICD-10-CM | POA: Diagnosis not present

## 2023-08-09 DIAGNOSIS — N185 Chronic kidney disease, stage 5: Secondary | ICD-10-CM | POA: Insufficient documentation

## 2023-08-09 DIAGNOSIS — D631 Anemia in chronic kidney disease: Secondary | ICD-10-CM | POA: Diagnosis not present

## 2023-08-09 LAB — CBC
HCT: 35 % — ABNORMAL LOW (ref 39.0–52.0)
Hemoglobin: 11 g/dL — ABNORMAL LOW (ref 13.0–17.0)
MCH: 28.5 pg (ref 26.0–34.0)
MCHC: 31.4 g/dL (ref 30.0–36.0)
MCV: 90.7 fL (ref 80.0–100.0)
Platelets: 277 10*3/uL (ref 150–400)
RBC: 3.86 MIL/uL — ABNORMAL LOW (ref 4.22–5.81)
RDW: 13.9 % (ref 11.5–15.5)
WBC: 7.4 10*3/uL (ref 4.0–10.5)
nRBC: 0 % (ref 0.0–0.2)

## 2023-08-09 NOTE — Progress Notes (Signed)
 No injection needed today per parameters. Patient's hemoglobin noted to be 11 today. Patient made aware and verbalized understanding.

## 2023-08-10 ENCOUNTER — Inpatient Hospital Stay

## 2023-08-20 DIAGNOSIS — E1122 Type 2 diabetes mellitus with diabetic chronic kidney disease: Secondary | ICD-10-CM | POA: Diagnosis not present

## 2023-08-20 DIAGNOSIS — I5032 Chronic diastolic (congestive) heart failure: Secondary | ICD-10-CM | POA: Diagnosis not present

## 2023-08-20 DIAGNOSIS — I10 Essential (primary) hypertension: Secondary | ICD-10-CM | POA: Diagnosis not present

## 2023-08-20 DIAGNOSIS — E782 Mixed hyperlipidemia: Secondary | ICD-10-CM | POA: Diagnosis not present

## 2023-08-31 ENCOUNTER — Inpatient Hospital Stay: Attending: Hematology

## 2023-08-31 ENCOUNTER — Inpatient Hospital Stay

## 2023-08-31 VITALS — BP 138/57 | HR 60 | Temp 97.2°F | Resp 18

## 2023-08-31 DIAGNOSIS — N185 Chronic kidney disease, stage 5: Secondary | ICD-10-CM | POA: Insufficient documentation

## 2023-08-31 DIAGNOSIS — D509 Iron deficiency anemia, unspecified: Secondary | ICD-10-CM

## 2023-08-31 DIAGNOSIS — D631 Anemia in chronic kidney disease: Secondary | ICD-10-CM | POA: Diagnosis not present

## 2023-08-31 DIAGNOSIS — I120 Hypertensive chronic kidney disease with stage 5 chronic kidney disease or end stage renal disease: Secondary | ICD-10-CM | POA: Insufficient documentation

## 2023-08-31 LAB — CBC
HCT: 33.9 % — ABNORMAL LOW (ref 39.0–52.0)
Hemoglobin: 10.9 g/dL — ABNORMAL LOW (ref 13.0–17.0)
MCH: 29.2 pg (ref 26.0–34.0)
MCHC: 32.2 g/dL (ref 30.0–36.0)
MCV: 90.9 fL (ref 80.0–100.0)
Platelets: 268 10*3/uL (ref 150–400)
RBC: 3.73 MIL/uL — ABNORMAL LOW (ref 4.22–5.81)
RDW: 13.5 % (ref 11.5–15.5)
WBC: 8.3 10*3/uL (ref 4.0–10.5)
nRBC: 0 % (ref 0.0–0.2)

## 2023-08-31 MED ORDER — EPOETIN ALFA-EPBX 10000 UNIT/ML IJ SOLN
10000.0000 [IU] | Freq: Once | INTRAMUSCULAR | Status: AC
  Administered 2023-08-31: 10000 [IU] via SUBCUTANEOUS
  Filled 2023-08-31: qty 1

## 2023-08-31 NOTE — Patient Instructions (Signed)
 [facility] [place] - A DEPT OF [facility]  Discharge Instructions: Thank you for choosing [facility] to provide your oncology and hematology care.  If you have a lab appointment with the Cancer Center - please note that after April 8th, 2024, all labs will be drawn in the cancer center.  You do not have to check in or register with the main entrance as you have in the past but will complete your check-in in the cancer center.  Wear comfortable clothing and clothing appropriate for easy access to any Portacath or PICC line.   We strive to give you quality time with your provider. You may need to reschedule your appointment if you arrive late (15 or more minutes).  Arriving late affects you and other patients whose appointments are after yours.  Also, if you miss three or more appointments without notifying the office, you may be dismissed from the clinic at the provider's discretion.      For prescription refill requests, have your pharmacy contact our office and allow 72 hours for refills to be completed.    Today you received the following :  Retacrit.  Epoetin Alfa Injection What is this medication? EPOETIN ALFA (e POE e tin AL fa) treats low levels of red blood cells (anemia) caused by kidney disease, chemotherapy, or HIV medications. It can also be used in people who are at risk for blood loss during surgery. It works by Systems analyst make more red blood cells, which reduces the need for blood transfusions. This medicine may be used for other purposes; ask your health care provider or pharmacist if you have questions. COMMON BRAND NAME(S): Epogen, Procrit, Retacrit What should I tell my care team before I take this medication? They need to know if you have any of these conditions: Blood clots Cancer Heart disease High blood pressure On dialysis Seizures Stroke An unusual or allergic reaction to epoetin alfa, albumin, benzyl alcohol, other  medications, foods, dyes, or preservatives Pregnant or trying to get pregnant Breast-feeding How should I use this medication? This medication is injected into a vein or under the skin. It is usually given by your care team in a hospital or clinic setting. It may also be given at home. If you get this medication at home, you will be taught how to prepare and give it. Use exactly as directed. Take it as directed on the prescription label at the same time every day. Keep taking it unless your care team tells you to stop. It is important that you put your used needles and syringes in a special sharps container. Do not put them in a trash can. If you do not have a sharps container, call your pharmacist or care team to get one. A special MedGuide will be given to you by the pharmacist with each prescription and refill. Be sure to read this information carefully each time. Talk to your care team about the use of this medication in children. While this medication may be used in children as young as 1 month of age for selected conditions, precautions do apply. Overdosage: If you think you have taken too much of this medicine contact a poison control center or emergency room at once. NOTE: This medicine is only for you. Do not share this medicine with others. What if I miss a dose? If you miss a dose, take it as soon as you can. If it is almost time for your  next dose, take only that dose. Do not take double or extra doses. What may interact with this medication? Darbepoetin alfa Methoxy polyethylene glycol-epoetin beta This list may not describe all possible interactions. Give your health care provider a list of all the medicines, herbs, non-prescription drugs, or dietary supplements you use. Also tell them if you smoke, drink alcohol, or use illegal drugs. Some items may interact with your medicine. What should I watch for while using this medication? Visit your care team for regular checks on your  progress. Check your blood pressure as directed. Know what your blood pressure should be and when to contact your care team. Your condition will be monitored carefully while you are receiving this medication. You may need blood work while taking this medication. What side effects may I notice from receiving this medication? Side effects that you should report to your care team as soon as possible: Allergic reactions--skin rash, itching, hives, swelling of the face, lips, tongue, or throat Blood clot--pain, swelling, or warmth in the leg, shortness of breath, chest pain Heart attack--pain or tightness in the chest, shoulders, arms, or jaw, nausea, shortness of breath, cold or clammy skin, feeling faint or lightheaded Increase in blood pressure Rash, fever, and swollen lymph nodes Redness, blistering, peeling, or loosening of the skin, including inside the mouth Seizures Stroke--sudden numbness or weakness of the face, arm, or leg, trouble speaking, confusion, trouble walking, loss of balance or coordination, dizziness, severe headache, change in vision Side effects that usually do not require medical attention (report to your care team if they continue or are bothersome): Bone, joint, or muscle pain Cough Headache Nausea Pain, redness, or irritation at injection site This list may not describe all possible side effects. Call your doctor for medical advice about side effects. You may report side effects to FDA at 1-800-FDA-1088. Where should I keep my medication? Keep out of the reach of children and pets. Store in a refrigerator. Do not freeze. Do not shake. Protect from light. Keep this medication in the original container until you are ready to take it. See product for storage information. Get rid of any unused medication after the expiration date. To get rid of medications that are no longer needed or have expired: Take the medication to a medication take-back program. Check with your  pharmacy or law enforcement to find a location. If you cannot return the medication, ask your pharmacist or care team how to get rid of the medication safely. NOTE: This sheet is a summary. It may not cover all possible information. If you have questions about this medicine, talk to your doctor, pharmacist, or health care provider.  2024 Elsevier/Gold Standard (2021-07-11 00:00:00)    To help prevent nausea and vomiting after your treatment, we encourage you to take your nausea medication as directed.  BELOW ARE SYMPTOMS THAT SHOULD BE REPORTED IMMEDIATELY: *FEVER GREATER THAN 100.4 F (38 C) OR HIGHER *CHILLS OR SWEATING *NAUSEA AND VOMITING THAT IS NOT CONTROLLED WITH YOUR NAUSEA MEDICATION *UNUSUAL SHORTNESS OF BREATH *UNUSUAL BRUISING OR BLEEDING *URINARY PROBLEMS (pain or burning when urinating, or frequent urination) *BOWEL PROBLEMS (unusual diarrhea, constipation, pain near the anus) TENDERNESS IN MOUTH AND THROAT WITH OR WITHOUT PRESENCE OF ULCERS (sore throat, sores in mouth, or a toothache) UNUSUAL RASH, SWELLING OR PAIN  UNUSUAL VAGINAL DISCHARGE OR ITCHING   Items with * indicate a potential emergency and should be followed up as soon as possible or go to the Emergency Department if any problems should  occur.  Please show the CHEMOTHERAPY ALERT CARD or IMMUNOTHERAPY ALERT CARD at check-in to the Emergency Department and triage nurse.  Should you have questions after your visit or need to cancel or reschedule your appointment, please contact [facility] [place] - A DEPT OF [facility] [phone]  and follow the prompts.  Office hours are 8:00 a.m. to 4:30 p.m. Monday - Friday. Please note that voicemails left after 4:00 p.m. may not be returned until the following business day.  We are closed weekends and major holidays. You have access to a nurse at all times for urgent questions. Please call the main number to the clinic [phone] and follow the  prompts.  For any non-urgent questions, you may also contact your provider using MyChart. We now offer e-Visits for anyone 73 and older to request care online for non-urgent symptoms. For details visit [email or web address].   Also download the MyChart app! Go to the app store, search "MyChart", open the app, select [place], and log in with your MyChart username and password.

## 2023-08-31 NOTE — Progress Notes (Signed)
 Hemoglobin today is 10.9.  We will proceed with Retacrit  per provider orders.   Patient tolerated injection with no complaints voiced.  Site clean and dry with no bruising or swelling noted.  No complaints of pain.  Discharged with vital signs stable and no signs or symptoms of distress noted.

## 2023-09-20 DIAGNOSIS — E782 Mixed hyperlipidemia: Secondary | ICD-10-CM | POA: Diagnosis not present

## 2023-09-20 DIAGNOSIS — I5032 Chronic diastolic (congestive) heart failure: Secondary | ICD-10-CM | POA: Diagnosis not present

## 2023-09-20 DIAGNOSIS — E1122 Type 2 diabetes mellitus with diabetic chronic kidney disease: Secondary | ICD-10-CM | POA: Diagnosis not present

## 2023-09-20 DIAGNOSIS — I10 Essential (primary) hypertension: Secondary | ICD-10-CM | POA: Diagnosis not present

## 2023-09-21 ENCOUNTER — Inpatient Hospital Stay (HOSPITAL_BASED_OUTPATIENT_CLINIC_OR_DEPARTMENT_OTHER)

## 2023-09-21 ENCOUNTER — Inpatient Hospital Stay: Attending: Hematology

## 2023-09-21 DIAGNOSIS — N185 Chronic kidney disease, stage 5: Secondary | ICD-10-CM | POA: Insufficient documentation

## 2023-09-21 DIAGNOSIS — D631 Anemia in chronic kidney disease: Secondary | ICD-10-CM | POA: Insufficient documentation

## 2023-09-21 DIAGNOSIS — I120 Hypertensive chronic kidney disease with stage 5 chronic kidney disease or end stage renal disease: Secondary | ICD-10-CM | POA: Insufficient documentation

## 2023-09-21 LAB — CBC
HCT: 35.7 % — ABNORMAL LOW (ref 39.0–52.0)
Hemoglobin: 11 g/dL — ABNORMAL LOW (ref 13.0–17.0)
MCH: 28.2 pg (ref 26.0–34.0)
MCHC: 30.8 g/dL (ref 30.0–36.0)
MCV: 91.5 fL (ref 80.0–100.0)
Platelets: 259 10*3/uL (ref 150–400)
RBC: 3.9 MIL/uL — ABNORMAL LOW (ref 4.22–5.81)
RDW: 14.9 % (ref 11.5–15.5)
WBC: 8.8 10*3/uL (ref 4.0–10.5)
nRBC: 0 % (ref 0.0–0.2)

## 2023-09-21 NOTE — Progress Notes (Signed)
 Hemoglobin is 11.0 no retacrit  today per protocol.

## 2023-10-06 DIAGNOSIS — R5382 Chronic fatigue, unspecified: Secondary | ICD-10-CM | POA: Diagnosis not present

## 2023-10-06 DIAGNOSIS — D649 Anemia, unspecified: Secondary | ICD-10-CM | POA: Diagnosis not present

## 2023-10-06 DIAGNOSIS — I10 Essential (primary) hypertension: Secondary | ICD-10-CM | POA: Diagnosis not present

## 2023-10-06 DIAGNOSIS — E782 Mixed hyperlipidemia: Secondary | ICD-10-CM | POA: Diagnosis not present

## 2023-10-06 DIAGNOSIS — D559 Anemia due to enzyme disorder, unspecified: Secondary | ICD-10-CM | POA: Diagnosis not present

## 2023-10-06 DIAGNOSIS — D631 Anemia in chronic kidney disease: Secondary | ICD-10-CM | POA: Diagnosis not present

## 2023-10-06 DIAGNOSIS — N185 Chronic kidney disease, stage 5: Secondary | ICD-10-CM | POA: Diagnosis not present

## 2023-10-06 DIAGNOSIS — E559 Vitamin D deficiency, unspecified: Secondary | ICD-10-CM | POA: Diagnosis not present

## 2023-10-11 DIAGNOSIS — E1129 Type 2 diabetes mellitus with other diabetic kidney complication: Secondary | ICD-10-CM | POA: Diagnosis not present

## 2023-10-11 DIAGNOSIS — N185 Chronic kidney disease, stage 5: Secondary | ICD-10-CM | POA: Diagnosis not present

## 2023-10-11 DIAGNOSIS — R809 Proteinuria, unspecified: Secondary | ICD-10-CM | POA: Diagnosis not present

## 2023-10-11 DIAGNOSIS — E1122 Type 2 diabetes mellitus with diabetic chronic kidney disease: Secondary | ICD-10-CM | POA: Diagnosis not present

## 2023-10-12 ENCOUNTER — Inpatient Hospital Stay

## 2023-10-12 ENCOUNTER — Inpatient Hospital Stay: Admitting: Oncology

## 2023-10-12 ENCOUNTER — Other Ambulatory Visit (HOSPITAL_COMMUNITY)
Admission: RE | Admit: 2023-10-12 | Discharge: 2023-10-12 | Disposition: A | Discharge status: Home / Self Care | Source: Ambulatory Visit | Attending: Nephrology | Admitting: Nephrology

## 2023-10-12 VITALS — BP 176/75 | HR 71 | Temp 98.6°F | Resp 18 | Wt 210.9 lb

## 2023-10-12 DIAGNOSIS — E538 Deficiency of other specified B group vitamins: Secondary | ICD-10-CM

## 2023-10-12 DIAGNOSIS — R809 Proteinuria, unspecified: Secondary | ICD-10-CM | POA: Insufficient documentation

## 2023-10-12 DIAGNOSIS — D509 Iron deficiency anemia, unspecified: Secondary | ICD-10-CM

## 2023-10-12 DIAGNOSIS — D7589 Other specified diseases of blood and blood-forming organs: Secondary | ICD-10-CM | POA: Diagnosis not present

## 2023-10-12 DIAGNOSIS — D631 Anemia in chronic kidney disease: Secondary | ICD-10-CM | POA: Diagnosis not present

## 2023-10-12 DIAGNOSIS — N185 Chronic kidney disease, stage 5: Secondary | ICD-10-CM | POA: Insufficient documentation

## 2023-10-12 LAB — COMPREHENSIVE METABOLIC PANEL WITH GFR
ALT: 13 U/L (ref 0–44)
AST: 17 U/L (ref 15–41)
Albumin: 3.8 g/dL (ref 3.5–5.0)
Alkaline Phosphatase: 78 U/L (ref 38–126)
Anion gap: 12 (ref 5–15)
BUN: 59 mg/dL — ABNORMAL HIGH (ref 8–23)
CO2: 19 mmol/L — ABNORMAL LOW (ref 22–32)
Calcium: 8.6 mg/dL — ABNORMAL LOW (ref 8.9–10.3)
Chloride: 106 mmol/L (ref 98–111)
Creatinine, Ser: 5.93 mg/dL — ABNORMAL HIGH (ref 0.61–1.24)
GFR, Estimated: 9 mL/min — ABNORMAL LOW (ref >60)
Glucose, Bld: 136 mg/dL — ABNORMAL HIGH (ref 70–99)
Potassium: 4 mmol/L (ref 3.5–5.1)
Sodium: 137 mmol/L (ref 135–145)
Total Bilirubin: 0.4 mg/dL (ref 0.0–1.2)
Total Protein: 8.3 g/dL — ABNORMAL HIGH (ref 6.5–8.1)

## 2023-10-12 LAB — FOLATE: Folate: 6.3 ng/mL (ref >5.9)

## 2023-10-12 LAB — IRON AND TIBC
Iron: 88 ug/dL (ref 45–182)
Iron: 77 ug/dL (ref 45–182)
Saturation Ratios: 31 % (ref 17.9–39.5)
Saturation Ratios: 28 % (ref 17.9–39.5)
TIBC: 281 ug/dL (ref 250–450)
TIBC: 275 ug/dL (ref 250–450)
UIBC: 193 ug/dL
UIBC: 198 ug/dL

## 2023-10-12 LAB — HEPATITIS B SURFACE ANTIBODY,QUALITATIVE: Hep B S Ab: NONREACTIVE

## 2023-10-12 LAB — CBC
HCT: 34 % — ABNORMAL LOW (ref 39.0–52.0)
Hemoglobin: 11 g/dL — ABNORMAL LOW (ref 13.0–17.0)
MCH: 29.3 pg (ref 26.0–34.0)
MCHC: 32.4 g/dL (ref 30.0–36.0)
MCV: 90.7 fL (ref 80.0–100.0)
Platelets: 303 K/uL (ref 150–400)
RBC: 3.75 MIL/uL — ABNORMAL LOW (ref 4.22–5.81)
RDW: 14.7 % (ref 11.5–15.5)
WBC: 7.8 K/uL (ref 4.0–10.5)
nRBC: 0 % (ref 0.0–0.2)

## 2023-10-12 LAB — CBC WITH DIFFERENTIAL/PLATELET
Abs Immature Granulocytes: 0.04 K/uL (ref 0.00–0.07)
Basophils Absolute: 0.1 K/uL (ref 0.0–0.1)
Basophils Relative: 1 %
Eosinophils Absolute: 0.3 K/uL (ref 0.0–0.5)
Eosinophils Relative: 4 %
HCT: 35.6 % — ABNORMAL LOW (ref 39.0–52.0)
Hemoglobin: 11.5 g/dL — ABNORMAL LOW (ref 13.0–17.0)
Immature Granulocytes: 0 %
Lymphocytes Relative: 26 %
Lymphs Abs: 2.5 K/uL (ref 0.7–4.0)
MCH: 29.3 pg (ref 26.0–34.0)
MCHC: 32.3 g/dL (ref 30.0–36.0)
MCV: 90.8 fL (ref 80.0–100.0)
Monocytes Absolute: 0.5 K/uL (ref 0.1–1.0)
Monocytes Relative: 5 %
Neutro Abs: 6.3 K/uL (ref 1.7–7.7)
Neutrophils Relative %: 64 %
Platelets: 321 K/uL (ref 150–400)
RBC: 3.92 MIL/uL — ABNORMAL LOW (ref 4.22–5.81)
RDW: 14.7 % (ref 11.5–15.5)
WBC: 9.8 K/uL (ref 4.0–10.5)
nRBC: 0 % (ref 0.0–0.2)

## 2023-10-12 LAB — VITAMIN B12: Vitamin B-12: 2176 pg/mL — ABNORMAL HIGH (ref 180–914)

## 2023-10-12 LAB — FERRITIN
Ferritin: 338 ng/mL — ABNORMAL HIGH (ref 24–336)
Ferritin: 287 ng/mL (ref 24–336)

## 2023-10-12 LAB — HEPATITIS B SURFACE ANTIGEN: Hepatitis B Surface Ag: NONREACTIVE

## 2023-10-12 LAB — HEPATITIS B CORE ANTIBODY, IGM: Hep B C IgM: NONREACTIVE

## 2023-10-12 NOTE — Progress Notes (Signed)
 Patient's Hgb 11.5. per MD parameters pt does not need Retacrit  shot today. Pt updated and all questions answered at this time. All follow ups as scheduled.   Devin [name]

## 2023-10-12 NOTE — Assessment & Plan Note (Signed)
 He has received intermittent IV iron last given on 03/25/2022. Most recent ferritin from 10/12/2023 was 338. No additional IV iron needed.

## 2023-10-12 NOTE — Progress Notes (Signed)
 [facility] OFFICE PROGRESS NOTE  Toribio Jerel MATSU, MD  ASSESSMENT & PLAN:  Assessment & Plan Anemia in stage 5 chronic kidney disease, not on chronic dialysis [facility]) He is followed by Dr. Rachele for CKD stage V. Receives Retacrit  10,000 units approximately every 3 weeks. Has an appointment tomorrow at the hospital to discuss dialysis and to have fistula placed. I am fairly certain when he starts dialysis, he will also be able to get his Retacrit  as well. Will schedule his appointment out 4 months and he will let us  know if he does not start dialysis in the next month or so. Reviewed his labs CBC and CMP which are essentially unremarkable.  He did not need Retacrit  today given hemoglobin was 11.5. B12 deficiency He has received B12 shots intermittently last given on 03/17/2022. Last B12 level from 05/13/2023 was 673. No additional B12 needed at this time. Iron deficiency anemia, unspecified iron deficiency anemia type He has received intermittent IV iron last given on 03/25/2022. Most recent ferritin from 10/12/2023 was 338. No additional IV iron needed.    Orders Placed This Encounter  Procedures   Comprehensive metabolic panel    Standing Status:   Future    Expected Date:   02/12/2024    Expiration Date:   05/12/2024   CBC with Differential/Platelet    Standing Status:   Future    Expected Date:   02/12/2024    Expiration Date:   05/12/2024    Release to patient:   Immediate   Iron and TIBC    Standing Status:   Future    Expected Date:   02/12/2024    Expiration Date:   05/12/2024    Release to patient:   Immediate   Ferritin    Standing Status:   Future    Expected Date:   02/12/2024    Expiration Date:   05/12/2024    Release to patient:   Immediate    INTERVAL HISTORY: Patient returns for anemia stage V CKD, B12 and iron deficiency.  Recently his Retacrit  was moved from every 2 weeks to every 3 weeks.  Continues to tolerate Retacrit  well.  He is followed  by Dr. Rachele and thinks he will start dialysis in the next couple of weeks.  He has a scheduled visit tomorrow at [facility] for further instructions regarding this.  Denies any interval hospitalizations, surgeries or changes to his baseline health.  Reports he overall feels well.  He has occasional constipation.  We reviewed his CBC, CMP, iron and ferritin.  SUMMARY OF HEMATOLOGIC HISTORY: Mr. Fearnow is an 78 year old male medical history significant for stage V chronic kidney disease, iron deficiency anemia and B12 deficiency.  He currently receives Retacrit  every 3 weeks or so.  He IV iron in the past last given on 03/25/2022.  He was given B12 shot on 03/17/2022.  He denies any bright red blood per rectum, melena or hematochezia.  CBC    Component Value Date/Time   WBC 9.8 10/12/2023 1302   RBC 3.92 (L) 10/12/2023 1302   HGB 11.5 (L) 10/12/2023 1302   HCT 35.6 (L) 10/12/2023 1302   PLT 321 10/12/2023 1302   MCV 90.8 10/12/2023 1302   MCH 29.3 10/12/2023 1302   MCHC 32.3 10/12/2023 1302   RDW 14.7 10/12/2023 1302   LYMPHSABS 2.5 10/12/2023 1302   MONOABS 0.5 10/12/2023 1302   EOSABS 0.3 10/12/2023 1302   BASOSABS 0.1 10/12/2023 1302   Iron/TIBC/Ferritin/ %  Sat    Component Value Date/Time   IRON 88 10/12/2023 1302   TIBC 281 10/12/2023 1302   FERRITIN 338 (H) 10/12/2023 1302   IRONPCTSAT 31 10/12/2023 1302     Vitals:   10/12/23 1345 10/12/23 1347  BP: (!) 183/79 (!) 176/75  Pulse: 71   Resp: 18   Temp: 98.6 F (37 C)   SpO2: 95%    Review of Systems  Constitutional:  Positive for malaise/fatigue.  Gastrointestinal:  Positive for constipation.   Physical Exam Constitutional:      Appearance: Normal appearance.  HENT:     Head: Normocephalic and atraumatic.  Eyes:     Pupils: Pupils are equal, round, and reactive to light.  Cardiovascular:     Rate and Rhythm: Normal rate and regular rhythm.     Heart sounds: Normal heart sounds. No murmur  heard. Pulmonary:     Effort: Pulmonary effort is normal.     Breath sounds: Normal breath sounds. No wheezing.  Abdominal:     General: Bowel sounds are normal. There is no distension.     Palpations: Abdomen is soft.     Tenderness: There is no abdominal tenderness.  Musculoskeletal:        General: Normal range of motion.     Cervical back: Normal range of motion.  Skin:    General: Skin is warm and dry.     Findings: No rash.  Neurological:     Mental Status: He is alert and oriented to person, place, and time.     Gait: Gait is intact.  Psychiatric:        Mood and Affect: Mood and affect normal.        Cognition and Memory: Memory normal.        Judgment: Judgment normal.     I spent 25 minutes dedicated to the care of this patient (face-to-face and non-face-to-face) on the date of the encounter to include what is described in the assessment and plan.,  Delon Hope, NP 10/12/2023 2:21 PM

## 2023-10-12 NOTE — Assessment & Plan Note (Addendum)
 He is followed by Dr. Rachele for CKD stage V. Receives Retacrit  10,000 units approximately every 3 weeks. Has an appointment tomorrow at the hospital to discuss dialysis and to have fistula placed. I am fairly certain when he starts dialysis, he will also be able to get his Retacrit  as well. Will schedule his appointment out 4 months and he will let us  know if he does not start dialysis in the next month or so. Reviewed his labs CBC and CMP which are essentially unremarkable.  He did not need Retacrit  today given hemoglobin was 11.5.

## 2023-10-13 ENCOUNTER — Ambulatory Visit (HOSPITAL_COMMUNITY)
Admission: RE | Admit: 2023-10-13 | Discharge: 2023-10-13 | Disposition: A | Discharge status: Home / Self Care | Attending: Vascular Surgery | Admitting: Vascular Surgery

## 2023-10-13 ENCOUNTER — Encounter (HOSPITAL_COMMUNITY)
Admission: RE | Disposition: A | Discharge status: Home / Self Care | Payer: Self-pay | Source: Home / Self Care | Attending: Vascular Surgery

## 2023-10-13 ENCOUNTER — Other Ambulatory Visit: Payer: Self-pay

## 2023-10-13 DIAGNOSIS — I120 Hypertensive chronic kidney disease with stage 5 chronic kidney disease or end stage renal disease: Secondary | ICD-10-CM | POA: Insufficient documentation

## 2023-10-13 DIAGNOSIS — N186 End stage renal disease: Secondary | ICD-10-CM | POA: Diagnosis not present

## 2023-10-13 DIAGNOSIS — Z992 Dependence on renal dialysis: Secondary | ICD-10-CM

## 2023-10-13 DIAGNOSIS — E1122 Type 2 diabetes mellitus with diabetic chronic kidney disease: Secondary | ICD-10-CM | POA: Diagnosis not present

## 2023-10-13 DIAGNOSIS — Z87891 Personal history of nicotine dependence: Secondary | ICD-10-CM | POA: Diagnosis not present

## 2023-10-13 HISTORY — PX: DIALYSIS/PERMA CATHETER INSERTION: CATH118288

## 2023-10-13 LAB — HEPATITIS B CORE ANTIBODY, TOTAL: HEP B CORE AB: NEGATIVE

## 2023-10-13 LAB — HCV INTERPRETATION

## 2023-10-13 LAB — HCV AB W REFLEX TO QUANT PCR: HCV Ab: NONREACTIVE

## 2023-10-13 LAB — GLUCOSE, CAPILLARY: Glucose-Capillary: 147 mg/dL — ABNORMAL HIGH (ref 70–99)

## 2023-10-13 SURGERY — DIALYSIS/PERMA CATHETER INSERTION
Anesthesia: LOCAL

## 2023-10-13 MED ORDER — HEPARIN SODIUM (PORCINE) 1000 UNIT/ML IJ SOLN
INTRAMUSCULAR | Status: AC
Start: 2023-10-13 — End: 2023-10-13
  Filled 2023-10-13: qty 10

## 2023-10-13 MED ORDER — HEPARIN (PORCINE) IN NACL 1000-0.9 UT/500ML-% IV SOLN
INTRAVENOUS | Status: DC | PRN
  Administered 2023-10-13: 500 mL

## 2023-10-13 MED ORDER — HEPARIN SODIUM (PORCINE) 1000 UNIT/ML IJ SOLN
INTRAMUSCULAR | Status: DC | PRN
  Administered 2023-10-13: 3800 [IU] via INTRAVENOUS

## 2023-10-13 MED ORDER — LIDOCAINE HCL (PF) 1 % IJ SOLN
INTRAMUSCULAR | Status: DC | PRN
  Administered 2023-10-13: 15 mL via INTRADERMAL

## 2023-10-13 MED ORDER — LIDOCAINE HCL (PF) 1 % IJ SOLN
INTRAMUSCULAR | Status: AC
  Filled 2023-10-13: qty 30

## 2023-10-13 SURGICAL SUPPLY — 6 items
CATH PALINDROME-P 19CM W/VT (CATHETERS) IMPLANT
CATH PALINDROME-P 23 W/VT (CATHETERS) IMPLANT
KIT MICROPUNCTURE NIT STIFF (SHEATH) IMPLANT
KIT PV (KITS) ×1 IMPLANT
TRAY PV CATH (CUSTOM PROCEDURE TRAY) ×1 IMPLANT
WIRE TORQFLEX AUST .018X40CM (WIRE) IMPLANT

## 2023-10-13 NOTE — Op Note (Signed)
    Patient name: Devin [name] MRN: [id number] DOB: Dec 24, 1945 Sex: male  10/13/2023 Pre-operative Diagnosis: End-stage renal disease Post-operative diagnosis:  Same Surgeon:  Lonni DOROTHA Gaskins, MD Procedure Performed: 1.  Ultrasound-guided access right internal jugular vein 2.  Ultrasound-guided access left internal jugular vein 3.  Left internal jugular vein tunneled dialysis catheter placement under fluoroscopic guidance (23 cm palindrome)  Indications: 78 year old male that needs to start dialysis that presents for tunneled dialysis catheter placement after risks benefits discussed.  Findings:   Initially tried to access the right internal jugular vein and this vein appeared smaller and I could not get a wire to traverse centrally.  I ultimately elected to access the left internal jugular vein that was much more robust and a wire advanced easily.  A 23 cm palindrome catheter was placed in the left IJ with tip in the right atrium under fluoroscopic guidance.  It flushed and aspirated easily.   Procedure:  The patient was identified in the holding area and taken to [facility] lab.  Placed on the table in supine position.  The bilateral necks and chest were prepped and draped in standard sterile fashion.  Timeout was performed.  I went ahead and injected local around the right neck with 1% lidocaine  without epinephrine .  I tried to place a micro access needle into the right internal jugular vein under ultrasound guidance.  The vein adjacent to the clavicle was much smaller and ultimately I was able to get in the vein but really could not get a wire to advance centrally without resistance.  Ultimately elected to abort and go to the other side.  Again I used local over the left neck with 1% lidocaine  without epinephrine  as well as created tunnel tract using about 20 mL.  I then accessed the left internal jugular vein with a micro access needle and placed a microwire and micro sheath under ultrasound  guidance and saw the wire advanced into the SVC and right atrium under fluoroscopic guidance.  I measured a 23 cm palindrome catheter on the left chest wall.  I then made a counterincision on the chest and then I tunneled the catheter from the catheter exit site to the IJ stick site.  I then dilated over the wire and placed a large dilator peel-away sheath into the right atrium.  I removed the wire and inner dilator.  I then advanced the catheter through the sheath into the right atrium and peeled the sheath away.  I repositioned the catheter to make sure it was not kinked.  It flushed and aspirated easily.  It was loaded with heparin  according to manufactures recommendations.  It was secured with 3-0 nylon and I closed the neck incision where we accessed the IJ with a 4-0 Monocryl and Dermabond.  Taken to holding in stable condition.    Complications: None  Condition: Stable  Lonni DOROTHA Gaskins, MD [facility] Office: [phone]   Lonni JINNY Gaskins

## 2023-10-13 NOTE — H&P (Signed)
 HD ACCESS CENTER H&P   Patient ID: Skipper Dacosta, male   DOB: 1946/03/13, 78 y.o.   MRN: [id number]  Subjective:     HPI Esker Dever is a 78 y.o. male with ESRD presenting to the HD access center for intervention.  Past Medical History:  Diagnosis Date   Arthritis    CKD (chronic kidney disease) stage 4, GFR 15-29 ml/min (HCC)    Diabetes mellitus without complication (HCC)    Gout    Hypertension    Family History  Problem Relation Age of Onset   Cancer Father    Stroke Sister    Kidney disease Sister    Heart disease Brother    Past Surgical History:  Procedure Laterality Date   AV FISTULA PLACEMENT Left 12/16/2021   Procedure: LEFT ARM ARTERIOVENOUS (AV) FISTULA CREATION;  Surgeon: Oris Krystal FALCON, MD;  Location: [facility];  Service: Vascular;  Laterality: Left;   BIOPSY  05/26/2022   Procedure: BIOPSY;  Surgeon: Eartha Angelia Sieving, MD;  [facility]: [facility];  Service: Gastroenterology;;   COLONOSCOPY WITH PROPOFOL  N/A 05/26/2022   Procedure: COLONOSCOPY WITH PROPOFOL ;  Surgeon: Eartha Angelia Sieving, MD;  [facility]: [facility];  Service: Gastroenterology;  Laterality: N/A;  200pm, asa 3   ESOPHAGOGASTRODUODENOSCOPY (EGD) WITH PROPOFOL  N/A 05/26/2022   Procedure: ESOPHAGOGASTRODUODENOSCOPY (EGD) WITH PROPOFOL ;  Surgeon: Eartha Angelia Sieving, MD;  [facility];  Service: Gastroenterology;  Laterality: N/A;   HEMOSTASIS CLIP PLACEMENT  05/26/2022   Procedure: HEMOSTASIS CLIP PLACEMENT;  Surgeon: Eartha Angelia Sieving, MD;  [facility];  Service: Gastroenterology;;   HYDROCELE EXCISION Right 04/15/2023   Procedure: HYDROCELECTOMY ADULT;  Surgeon: Sherrilee Belvie CROME, MD;  Location: [facility];  Service: Urology;  Laterality: Right;   KNEE SURGERY     POLYPECTOMY  05/26/2022   Procedure: POLYPECTOMY;  Surgeon: Eartha Angelia Sieving, MD;  [facility]: [facility];  Service: Gastroenterology;;   TONSILLECTOMY      Short Social History:   Social History   Tobacco Use   Smoking status: Former    Current packs/day: 0.00    Types: Cigarettes    Start date: 1973    Quit date: 1993    Years since quitting: 32.5    Passive exposure: Past   Smokeless tobacco: Never  Substance Use Topics   Alcohol use: Never    No Known Allergies  No current facility-administered medications for this encounter.    REVIEW OF SYSTEMS All other systems were reviewed and are negative     Objective:   Objective   Vitals:   10/13/23 1046 10/13/23 1049 10/13/23 1051  BP:   (!) 190/76  Pulse: (P) 71 68   Resp: (P) 12 14   Temp: 97.9 F (36.6 C)    TempSrc: Oral    SpO2: (P) 95% 98%    There is no height or weight on file to calculate BMI.  Physical Exam General: no acute distress Cardiac: hemodynamically stable Extremities: Thrombosed left brachiocephalic fistula     Assessment/Plan:   Marquist Binstock is a 78 y.o. male with ESRD presenting for tunneled dialysis catheter placement. He underwent a left brachiocephalic fistula creation with Dr. Oris in 2023 prior to initiating dialysis although this thrombosed and his neck veins used.  He is now progressed to end-stage renal disease and needs dialysis.  Plan for tunneled dialysis catheter placement today, will schedule for outpatient follow-up with vein mapping to discuss arm access in the future.SABRA  Norman Serve, MD [facility]

## 2023-10-14 ENCOUNTER — Encounter (HOSPITAL_COMMUNITY): Payer: Self-pay | Admitting: Vascular Surgery

## 2023-10-14 DIAGNOSIS — N185 Chronic kidney disease, stage 5: Secondary | ICD-10-CM | POA: Diagnosis not present

## 2023-10-14 DIAGNOSIS — R809 Proteinuria, unspecified: Secondary | ICD-10-CM | POA: Diagnosis not present

## 2023-10-14 DIAGNOSIS — E1122 Type 2 diabetes mellitus with diabetic chronic kidney disease: Secondary | ICD-10-CM | POA: Diagnosis not present

## 2023-10-14 DIAGNOSIS — E1129 Type 2 diabetes mellitus with other diabetic kidney complication: Secondary | ICD-10-CM | POA: Diagnosis not present

## 2023-10-15 DIAGNOSIS — E7849 Other hyperlipidemia: Secondary | ICD-10-CM | POA: Diagnosis not present

## 2023-10-15 DIAGNOSIS — E1122 Type 2 diabetes mellitus with diabetic chronic kidney disease: Secondary | ICD-10-CM | POA: Diagnosis not present

## 2023-10-15 DIAGNOSIS — I10 Essential (primary) hypertension: Secondary | ICD-10-CM | POA: Diagnosis not present

## 2023-10-15 DIAGNOSIS — I5032 Chronic diastolic (congestive) heart failure: Secondary | ICD-10-CM | POA: Diagnosis not present

## 2023-10-15 DIAGNOSIS — E782 Mixed hyperlipidemia: Secondary | ICD-10-CM | POA: Diagnosis not present

## 2023-10-15 MED FILL — Lidocaine HCl Local Preservative Free (PF) Inj 1%: INTRAMUSCULAR | Qty: 30 | Status: AC

## 2023-10-16 LAB — QUANTIFERON-TB GOLD PLUS (RQFGPL)
QuantiFERON Mitogen Value: >10 [IU]/mL
QuantiFERON Nil Value: 0.18 [IU]/mL
QuantiFERON TB1 Ag Value: 0.11 [IU]/mL
QuantiFERON TB2 Ag Value: 0.12 [IU]/mL

## 2023-10-16 LAB — QUANTIFERON-TB GOLD PLUS: QuantiFERON-TB Gold Plus: NEGATIVE

## 2023-10-18 DIAGNOSIS — Z79899 Other long term (current) drug therapy: Secondary | ICD-10-CM | POA: Diagnosis not present

## 2023-10-18 DIAGNOSIS — Z5181 Encounter for therapeutic drug level monitoring: Secondary | ICD-10-CM | POA: Diagnosis not present

## 2023-10-18 DIAGNOSIS — E875 Hyperkalemia: Secondary | ICD-10-CM | POA: Diagnosis not present

## 2023-10-18 DIAGNOSIS — Z992 Dependence on renal dialysis: Secondary | ICD-10-CM | POA: Diagnosis not present

## 2023-10-18 DIAGNOSIS — N186 End stage renal disease: Secondary | ICD-10-CM | POA: Diagnosis not present

## 2023-10-18 DIAGNOSIS — E1122 Type 2 diabetes mellitus with diabetic chronic kidney disease: Secondary | ICD-10-CM | POA: Diagnosis not present

## 2023-10-18 DIAGNOSIS — E872 Acidosis, unspecified: Secondary | ICD-10-CM | POA: Diagnosis not present

## 2023-10-20 DIAGNOSIS — E872 Acidosis, unspecified: Secondary | ICD-10-CM | POA: Diagnosis not present

## 2023-10-20 DIAGNOSIS — E875 Hyperkalemia: Secondary | ICD-10-CM | POA: Diagnosis not present

## 2023-10-20 DIAGNOSIS — Z992 Dependence on renal dialysis: Secondary | ICD-10-CM | POA: Diagnosis not present

## 2023-10-20 DIAGNOSIS — N186 End stage renal disease: Secondary | ICD-10-CM | POA: Diagnosis not present

## 2023-10-21 DIAGNOSIS — N186 End stage renal disease: Secondary | ICD-10-CM | POA: Diagnosis not present

## 2023-10-21 DIAGNOSIS — E872 Acidosis, unspecified: Secondary | ICD-10-CM | POA: Diagnosis not present

## 2023-10-21 DIAGNOSIS — E782 Mixed hyperlipidemia: Secondary | ICD-10-CM | POA: Diagnosis not present

## 2023-10-21 DIAGNOSIS — E1122 Type 2 diabetes mellitus with diabetic chronic kidney disease: Secondary | ICD-10-CM | POA: Diagnosis not present

## 2023-10-21 DIAGNOSIS — I10 Essential (primary) hypertension: Secondary | ICD-10-CM | POA: Diagnosis not present

## 2023-10-21 DIAGNOSIS — I5032 Chronic diastolic (congestive) heart failure: Secondary | ICD-10-CM | POA: Diagnosis not present

## 2023-10-21 DIAGNOSIS — Z992 Dependence on renal dialysis: Secondary | ICD-10-CM | POA: Diagnosis not present

## 2023-10-21 DIAGNOSIS — E875 Hyperkalemia: Secondary | ICD-10-CM | POA: Diagnosis not present

## 2023-10-22 DIAGNOSIS — E782 Mixed hyperlipidemia: Secondary | ICD-10-CM | POA: Diagnosis not present

## 2023-10-22 DIAGNOSIS — Z992 Dependence on renal dialysis: Secondary | ICD-10-CM | POA: Diagnosis not present

## 2023-10-22 DIAGNOSIS — Z23 Encounter for immunization: Secondary | ICD-10-CM | POA: Diagnosis not present

## 2023-10-22 DIAGNOSIS — I10 Essential (primary) hypertension: Secondary | ICD-10-CM | POA: Diagnosis not present

## 2023-10-22 DIAGNOSIS — I5032 Chronic diastolic (congestive) heart failure: Secondary | ICD-10-CM | POA: Diagnosis not present

## 2023-10-22 DIAGNOSIS — E1122 Type 2 diabetes mellitus with diabetic chronic kidney disease: Secondary | ICD-10-CM | POA: Diagnosis not present

## 2023-10-22 DIAGNOSIS — E875 Hyperkalemia: Secondary | ICD-10-CM | POA: Diagnosis not present

## 2023-10-22 DIAGNOSIS — E872 Acidosis, unspecified: Secondary | ICD-10-CM | POA: Diagnosis not present

## 2023-10-22 DIAGNOSIS — N186 End stage renal disease: Secondary | ICD-10-CM | POA: Diagnosis not present

## 2023-10-25 DIAGNOSIS — E875 Hyperkalemia: Secondary | ICD-10-CM | POA: Diagnosis not present

## 2023-10-25 DIAGNOSIS — N186 End stage renal disease: Secondary | ICD-10-CM | POA: Diagnosis not present

## 2023-10-25 DIAGNOSIS — Z992 Dependence on renal dialysis: Secondary | ICD-10-CM | POA: Diagnosis not present

## 2023-10-25 DIAGNOSIS — Z23 Encounter for immunization: Secondary | ICD-10-CM | POA: Diagnosis not present

## 2023-10-25 DIAGNOSIS — E872 Acidosis, unspecified: Secondary | ICD-10-CM | POA: Diagnosis not present

## 2023-10-26 DIAGNOSIS — E875 Hyperkalemia: Secondary | ICD-10-CM | POA: Diagnosis not present

## 2023-10-26 DIAGNOSIS — Z992 Dependence on renal dialysis: Secondary | ICD-10-CM | POA: Diagnosis not present

## 2023-10-26 DIAGNOSIS — N186 End stage renal disease: Secondary | ICD-10-CM | POA: Diagnosis not present

## 2023-10-26 DIAGNOSIS — E872 Acidosis, unspecified: Secondary | ICD-10-CM | POA: Diagnosis not present

## 2023-10-26 DIAGNOSIS — Z23 Encounter for immunization: Secondary | ICD-10-CM | POA: Diagnosis not present

## 2023-10-27 DIAGNOSIS — E872 Acidosis, unspecified: Secondary | ICD-10-CM | POA: Diagnosis not present

## 2023-10-27 DIAGNOSIS — E875 Hyperkalemia: Secondary | ICD-10-CM | POA: Diagnosis not present

## 2023-10-27 DIAGNOSIS — N186 End stage renal disease: Secondary | ICD-10-CM | POA: Diagnosis not present

## 2023-10-27 DIAGNOSIS — Z23 Encounter for immunization: Secondary | ICD-10-CM | POA: Diagnosis not present

## 2023-10-27 DIAGNOSIS — Z992 Dependence on renal dialysis: Secondary | ICD-10-CM | POA: Diagnosis not present

## 2023-10-29 ENCOUNTER — Other Ambulatory Visit: Payer: Self-pay

## 2023-10-29 DIAGNOSIS — Z23 Encounter for immunization: Secondary | ICD-10-CM | POA: Diagnosis not present

## 2023-10-29 DIAGNOSIS — E872 Acidosis, unspecified: Secondary | ICD-10-CM | POA: Diagnosis not present

## 2023-10-29 DIAGNOSIS — N186 End stage renal disease: Secondary | ICD-10-CM | POA: Diagnosis not present

## 2023-10-29 DIAGNOSIS — E875 Hyperkalemia: Secondary | ICD-10-CM | POA: Diagnosis not present

## 2023-10-29 DIAGNOSIS — Z992 Dependence on renal dialysis: Secondary | ICD-10-CM | POA: Diagnosis not present

## 2023-10-29 DIAGNOSIS — N184 Chronic kidney disease, stage 4 (severe): Secondary | ICD-10-CM

## 2023-11-01 DIAGNOSIS — N186 End stage renal disease: Secondary | ICD-10-CM | POA: Diagnosis not present

## 2023-11-01 DIAGNOSIS — Z23 Encounter for immunization: Secondary | ICD-10-CM | POA: Diagnosis not present

## 2023-11-01 DIAGNOSIS — Z992 Dependence on renal dialysis: Secondary | ICD-10-CM | POA: Diagnosis not present

## 2023-11-01 DIAGNOSIS — E875 Hyperkalemia: Secondary | ICD-10-CM | POA: Diagnosis not present

## 2023-11-01 DIAGNOSIS — E872 Acidosis, unspecified: Secondary | ICD-10-CM | POA: Diagnosis not present

## 2023-11-02 DIAGNOSIS — E872 Acidosis, unspecified: Secondary | ICD-10-CM | POA: Diagnosis not present

## 2023-11-02 DIAGNOSIS — N186 End stage renal disease: Secondary | ICD-10-CM | POA: Diagnosis not present

## 2023-11-02 DIAGNOSIS — Z992 Dependence on renal dialysis: Secondary | ICD-10-CM | POA: Diagnosis not present

## 2023-11-02 DIAGNOSIS — Z23 Encounter for immunization: Secondary | ICD-10-CM | POA: Diagnosis not present

## 2023-11-02 DIAGNOSIS — E875 Hyperkalemia: Secondary | ICD-10-CM | POA: Diagnosis not present

## 2023-11-03 DIAGNOSIS — Z992 Dependence on renal dialysis: Secondary | ICD-10-CM | POA: Diagnosis not present

## 2023-11-03 DIAGNOSIS — E875 Hyperkalemia: Secondary | ICD-10-CM | POA: Diagnosis not present

## 2023-11-03 DIAGNOSIS — E872 Acidosis, unspecified: Secondary | ICD-10-CM | POA: Diagnosis not present

## 2023-11-03 DIAGNOSIS — Z23 Encounter for immunization: Secondary | ICD-10-CM | POA: Diagnosis not present

## 2023-11-03 DIAGNOSIS — N186 End stage renal disease: Secondary | ICD-10-CM | POA: Diagnosis not present

## 2023-11-05 DIAGNOSIS — Z992 Dependence on renal dialysis: Secondary | ICD-10-CM | POA: Diagnosis not present

## 2023-11-05 DIAGNOSIS — Z23 Encounter for immunization: Secondary | ICD-10-CM | POA: Diagnosis not present

## 2023-11-05 DIAGNOSIS — E872 Acidosis, unspecified: Secondary | ICD-10-CM | POA: Diagnosis not present

## 2023-11-05 DIAGNOSIS — N186 End stage renal disease: Secondary | ICD-10-CM | POA: Diagnosis not present

## 2023-11-05 DIAGNOSIS — E875 Hyperkalemia: Secondary | ICD-10-CM | POA: Diagnosis not present

## 2023-11-08 DIAGNOSIS — N186 End stage renal disease: Secondary | ICD-10-CM | POA: Diagnosis not present

## 2023-11-08 DIAGNOSIS — E872 Acidosis, unspecified: Secondary | ICD-10-CM | POA: Diagnosis not present

## 2023-11-08 DIAGNOSIS — E875 Hyperkalemia: Secondary | ICD-10-CM | POA: Diagnosis not present

## 2023-11-08 DIAGNOSIS — Z992 Dependence on renal dialysis: Secondary | ICD-10-CM | POA: Diagnosis not present

## 2023-11-08 DIAGNOSIS — Z23 Encounter for immunization: Secondary | ICD-10-CM | POA: Diagnosis not present

## 2023-11-09 DIAGNOSIS — N186 End stage renal disease: Secondary | ICD-10-CM | POA: Diagnosis not present

## 2023-11-09 DIAGNOSIS — Z992 Dependence on renal dialysis: Secondary | ICD-10-CM | POA: Diagnosis not present

## 2023-11-09 DIAGNOSIS — Z23 Encounter for immunization: Secondary | ICD-10-CM | POA: Diagnosis not present

## 2023-11-09 DIAGNOSIS — E872 Acidosis, unspecified: Secondary | ICD-10-CM | POA: Diagnosis not present

## 2023-11-09 DIAGNOSIS — E875 Hyperkalemia: Secondary | ICD-10-CM | POA: Diagnosis not present

## 2023-11-10 DIAGNOSIS — N186 End stage renal disease: Secondary | ICD-10-CM | POA: Diagnosis not present

## 2023-11-10 DIAGNOSIS — E872 Acidosis, unspecified: Secondary | ICD-10-CM | POA: Diagnosis not present

## 2023-11-10 DIAGNOSIS — Z992 Dependence on renal dialysis: Secondary | ICD-10-CM | POA: Diagnosis not present

## 2023-11-10 DIAGNOSIS — E875 Hyperkalemia: Secondary | ICD-10-CM | POA: Diagnosis not present

## 2023-11-10 DIAGNOSIS — Z23 Encounter for immunization: Secondary | ICD-10-CM | POA: Diagnosis not present

## 2023-11-12 DIAGNOSIS — Z23 Encounter for immunization: Secondary | ICD-10-CM | POA: Diagnosis not present

## 2023-11-12 DIAGNOSIS — E875 Hyperkalemia: Secondary | ICD-10-CM | POA: Diagnosis not present

## 2023-11-12 DIAGNOSIS — N186 End stage renal disease: Secondary | ICD-10-CM | POA: Diagnosis not present

## 2023-11-12 DIAGNOSIS — E872 Acidosis, unspecified: Secondary | ICD-10-CM | POA: Diagnosis not present

## 2023-11-12 DIAGNOSIS — Z992 Dependence on renal dialysis: Secondary | ICD-10-CM | POA: Diagnosis not present

## 2023-11-15 DIAGNOSIS — B351 Tinea unguium: Secondary | ICD-10-CM | POA: Diagnosis not present

## 2023-11-15 DIAGNOSIS — E1122 Type 2 diabetes mellitus with diabetic chronic kidney disease: Secondary | ICD-10-CM | POA: Diagnosis not present

## 2023-11-15 DIAGNOSIS — N189 Chronic kidney disease, unspecified: Secondary | ICD-10-CM | POA: Diagnosis not present

## 2023-11-15 DIAGNOSIS — N185 Chronic kidney disease, stage 5: Secondary | ICD-10-CM | POA: Diagnosis not present

## 2023-11-15 DIAGNOSIS — E782 Mixed hyperlipidemia: Secondary | ICD-10-CM | POA: Diagnosis not present

## 2023-11-15 DIAGNOSIS — I5032 Chronic diastolic (congestive) heart failure: Secondary | ICD-10-CM | POA: Diagnosis not present

## 2023-11-15 DIAGNOSIS — D631 Anemia in chronic kidney disease: Secondary | ICD-10-CM | POA: Diagnosis not present

## 2023-11-15 DIAGNOSIS — D519 Vitamin B12 deficiency anemia, unspecified: Secondary | ICD-10-CM | POA: Diagnosis not present

## 2023-11-15 DIAGNOSIS — I10 Essential (primary) hypertension: Secondary | ICD-10-CM | POA: Diagnosis not present

## 2023-11-15 DIAGNOSIS — K219 Gastro-esophageal reflux disease without esophagitis: Secondary | ICD-10-CM | POA: Diagnosis not present

## 2023-11-16 ENCOUNTER — Ambulatory Visit

## 2023-11-16 DIAGNOSIS — N184 Chronic kidney disease, stage 4 (severe): Secondary | ICD-10-CM | POA: Diagnosis not present

## 2023-11-17 DIAGNOSIS — Z992 Dependence on renal dialysis: Secondary | ICD-10-CM | POA: Diagnosis not present

## 2023-11-17 DIAGNOSIS — N186 End stage renal disease: Secondary | ICD-10-CM | POA: Diagnosis not present

## 2023-11-19 DIAGNOSIS — N186 End stage renal disease: Secondary | ICD-10-CM | POA: Diagnosis not present

## 2023-11-19 DIAGNOSIS — Z992 Dependence on renal dialysis: Secondary | ICD-10-CM | POA: Diagnosis not present

## 2023-11-22 DIAGNOSIS — N184 Chronic kidney disease, stage 4 (severe): Secondary | ICD-10-CM | POA: Diagnosis not present

## 2023-11-22 DIAGNOSIS — Z992 Dependence on renal dialysis: Secondary | ICD-10-CM | POA: Diagnosis not present

## 2023-11-22 DIAGNOSIS — E782 Mixed hyperlipidemia: Secondary | ICD-10-CM | POA: Diagnosis not present

## 2023-11-22 DIAGNOSIS — I5032 Chronic diastolic (congestive) heart failure: Secondary | ICD-10-CM | POA: Diagnosis not present

## 2023-11-22 DIAGNOSIS — N186 End stage renal disease: Secondary | ICD-10-CM | POA: Diagnosis not present

## 2023-11-23 ENCOUNTER — Ambulatory Visit: Admitting: Vascular Surgery

## 2023-11-23 ENCOUNTER — Encounter: Payer: Self-pay | Admitting: Vascular Surgery

## 2023-11-23 VITALS — BP 190/80 | HR 72 | Temp 98.2°F | Resp 18 | Ht 69.0 in | Wt 198.8 lb

## 2023-11-23 DIAGNOSIS — Z992 Dependence on renal dialysis: Secondary | ICD-10-CM | POA: Insufficient documentation

## 2023-11-23 DIAGNOSIS — N186 End stage renal disease: Secondary | ICD-10-CM | POA: Diagnosis not present

## 2023-11-23 NOTE — Progress Notes (Signed)
 Patient name: Devin [name] MRN: [id number] DOB: 1945-10-04 Sex: male  REASON FOR VISIT: Discuss permanent hemodialysis access  HPI: [name] is a 78 y.o. male with history of hypertension, diabetes, end-stage renal disease that presents for follow-up to discuss permanent hemodialysis access.Recently underwent left IJ TDC placement by myself on 10/13/2023.  States he has been on dialysis about a month.  Catheter is working well.  He is right-handed.  No other chest wall implants.  Patient has had a prior left brachiocephalic on 12/16/2021 by Dr. Oris.  Unfortunately his fistula failed.  Past Medical History:  Diagnosis Date   Arthritis    CKD (chronic kidney disease) stage 4, GFR 15-29 ml/min (HCC)    Diabetes mellitus without complication (HCC)    Gout    Hypertension     Past Surgical History:  Procedure Laterality Date   AV FISTULA PLACEMENT Left 12/16/2021   Procedure: LEFT ARM ARTERIOVENOUS (AV) FISTULA CREATION;  Surgeon: Oris Krystal FALCON, MD;  Location: [facility];  Service: Vascular;  Laterality: Left;   BIOPSY  05/26/2022   Procedure: BIOPSY;  Surgeon: Eartha Angelia Sieving, MD;  [facility];  Service: Gastroenterology;;   COLONOSCOPY WITH PROPOFOL  N/A 05/26/2022   Procedure: COLONOSCOPY WITH PROPOFOL ;  Surgeon: Eartha Angelia Sieving, MD;  [facility];  Service: Gastroenterology;  Laterality: N/A;  200pm, asa 3   DIALYSIS/PERMA CATHETER INSERTION N/A 10/13/2023   Procedure: DIALYSIS/PERMA CATHETER INSERTION;  Surgeon: Gretta Lonni PARAS, MD;  Location: [facility];  Service: Cardiovascular;  Laterality: N/A;   ESOPHAGOGASTRODUODENOSCOPY (EGD) WITH PROPOFOL  N/A 05/26/2022   Procedure: ESOPHAGOGASTRODUODENOSCOPY (EGD) WITH PROPOFOL ;  Surgeon: Eartha Angelia Sieving, MD;  [facility];  Service: Gastroenterology;  Laterality: N/A;   HEMOSTASIS CLIP PLACEMENT  05/26/2022   Procedure: HEMOSTASIS CLIP PLACEMENT;  Surgeon: Eartha Angelia Sieving, MD;  [facility];  Service: Gastroenterology;;   HYDROCELE EXCISION Right 04/15/2023   Procedure: HYDROCELECTOMY ADULT;  Surgeon: Sherrilee Belvie CROME, MD;  Location: [facility];  Service: Urology;  Laterality: Right;   KNEE SURGERY     POLYPECTOMY  05/26/2022   Procedure: POLYPECTOMY;  Surgeon: Eartha Angelia, Sieving, MD;  [facility];  Service: Gastroenterology;;   TONSILLECTOMY      Family History  Problem Relation Age of Onset   Cancer Father    Stroke Sister    Kidney disease Sister    Heart disease Brother     SOCIAL HISTORY: Social History   Tobacco Use   Smoking status: Former    Current packs/day: 0.00    Types: Cigarettes    Start date: 1973    Quit date: 1993    Years since quitting: 32.6    Passive exposure: Past   Smokeless tobacco: Never  Substance Use Topics   Alcohol use: Never    No Known Allergies  Current Outpatient Medications  Medication Sig Dispense Refill   amLODipine  (NORVASC ) 10 MG tablet Take 10 mg by mouth daily.     aspirin  EC 81 MG tablet Take 81 mg by mouth daily. Swallow whole.     carvedilol  (COREG ) 12.5 MG tablet Take 12.5 mg by mouth 2 (two) times daily with a meal.     Cholecalciferol (VITAMIN D) 50 MCG (2000 UT) CAPS Take 2,000 Units by mouth daily.     cloNIDine  (CATAPRES ) 0.3 MG tablet Take 0.3 mg by mouth in the morning, at noon, and at bedtime.     cyanocobalamin  (VITAMIN B12) 1000  MCG tablet Take 1,000 mcg by mouth once a week.     ferrous sulfate  325 (65 FE) MG tablet Take 1 tablet (325 mg total) by mouth 2 (two) times daily with a meal. 180 tablet 3   furosemide  (LASIX ) 40 MG tablet Take 40 mg by mouth 2 (two) times daily.     hydrALAZINE  (APRESOLINE ) 100 MG tablet Take 100 mg by mouth in the morning, at noon, and at bedtime.     NIFEdipine (PROCARDIA-XL/NIFEDICAL-XL) 30 MG 24 hr tablet Take by mouth.     pantoprazole  (PROTONIX ) 40 MG tablet Take 1 tablet (40 mg total) by mouth 2 (two) times daily for  60 days, THEN 1 tablet (40 mg total) daily. 390 tablet 0   simvastatin  (ZOCOR ) 20 MG tablet Take 20 mg by mouth daily.     No current facility-administered medications for this visit.    REVIEW OF SYSTEMS:  [X]  denotes positive finding, [ ]  denotes negative finding Cardiac  Comments:  Chest pain or chest pressure:    Shortness of breath upon exertion:    Short of breath when lying flat:    Irregular heart rhythm:        Vascular    Pain in calf, thigh, or hip brought on by ambulation:    Pain in feet at night that wakes you up from your sleep:     Blood clot in your veins:    Leg swelling:         Pulmonary    Oxygen at home:    Productive cough:     Wheezing:         Neurologic    Sudden weakness in arms or legs:     Sudden numbness in arms or legs:     Sudden onset of difficulty speaking or slurred speech:    Temporary loss of vision in one eye:     Problems with dizziness:         Gastrointestinal    Blood in stool:     Vomited blood:         Genitourinary    Burning when urinating:     Blood in urine:        Psychiatric    Major depression:         Hematologic    Bleeding problems:    Problems with blood clotting too easily:        Skin    Rashes or ulcers:        Constitutional    Fever or chills:      PHYSICAL EXAM: There were no vitals filed for this visit.  GENERAL: The patient is a well-nourished male, in no acute distress. The vital signs are documented above. CARDIAC: There is a regular rate and rhythm.  VASCULAR:  2+ palpable radial brachial pulses bilateral upper extremities Left IJ TDC PULMONARY: No respiratory distress. ABDOMEN: Soft and non-tender. MUSCULOSKELETAL: There are no major deformities or cyanosis. NEUROLOGIC: No focal weakness or paresthesias are detected. SKIN: There are no ulcers or rashes noted. PSYCHIATRIC: The patient has a normal affect.  DATA:   UPPER EXTREMITY VEIN MAPPING   Patient Name:  Devin [name]  Date  of Exam:   11/16/2023  Medical Rec #: [id number]      Accession #:    [id number]  Date of Birth: 15-Sep-1945      Patient Gender: M  Patient Age:   17 years  Exam Location:  [address]  Procedure:  VAS US  UPPER EXT VEIN MAPPING (PRE-OP  AVF)  Referring Phys: LONNI GASKINS    ---------------------------------------------------------------------------  -----    Indications: Pre-access.   Performing Technologist: [place] RVT     Examination Guidelines: A complete evaluation includes B-mode imaging,  spectral  Doppler, color Doppler, and power Doppler as needed of all accessible  portions  of each vessel. Bilateral testing is considered an integral part of a  complete  examination. Limited examinations for reoccurring indications may be  performed  as noted.   +-----------------+-------------+----------+--------+  Right Cephalic   Diameter (cm)Depth (cm)Findings  +-----------------+-------------+----------+--------+  Prox upper arm       0.26                         +-----------------+-------------+----------+--------+  Mid upper arm        0.35                         +-----------------+-------------+----------+--------+  Dist upper arm       0.36                         +-----------------+-------------+----------+--------+  Antecubital fossa    0.36                         +-----------------+-------------+----------+--------+  Prox forearm         0.31                         +-----------------+-------------+----------+--------+  Mid forearm          0.27                         +-----------------+-------------+----------+--------+  Dist forearm         0.24                         +-----------------+-------------+----------+--------+   +-----------------+-------------+----------+--------+  Right Basilic    Diameter (cm)Depth (cm)Findings  +-----------------+-------------+----------+--------+  Mid upper arm         0.42                         +-----------------+-------------+----------+--------+  Dist upper arm       0.34                         +-----------------+-------------+----------+--------+  Antecubital fossa    0.27                         +-----------------+-------------+----------+--------+   Right brachial bifurcation at the ACF. Brachial diameter 0.57 cm. Velocity  83 cm/s. Waveform triphasic.  Radial artery diameter 0.57 cm. Velocity 80 cm/s. Waveform triphasic  Ulnar artery diameter 0.27 cm. Velocity 70 cm/s. Waveform triphasic    +--------------+-------------+----------+--------+  Left Basilic  Diameter (cm)Depth (cm)Findings  +--------------+-------------+----------+--------+  Prox upper arm    0.43                         +--------------+-------------+----------+--------+  Mid upper arm     0.34                         +--------------+-------------+----------+--------+  Dist upper arm  0.26                         +--------------+-------------+----------+--------+   Left brachial artery diamer 0.52 cm. Velocity 89 cm/s. Waveform triphasic  Summary: Right: Patent cephalic and basilic veins.         See above for limited arterial evaluation.  Left: Patent basilic veins.        See above for limited arterial evaluation.   *See table(s) above for measurements and observations.      Diagnosing physician: Lonni Gaskins MD  Electronically signed by Lonni Gaskins MD on 11/16/2023 at 2:44:35 PM.   Assessment/Plan:   78 y.o. male with history of hypertension, diabetes, end-stage renal disease that presents for follow-up to discuss permanent hemodialysis access.  Now using a left IJ TDC that was placed last month and is working well.  Patient has had a prior left brachiocephalic on 12/16/2021 by Dr. Oris that failed.  Patient is right-handed.  I have recommended new access.  Vein mapping shows a marginal left basilic vein.  Discussed  if basilic vein is not usable will require AV graft.  Discussed the use of basilic vein will be done in 2 stages.  Risk benefits discussed including failure to mature, steal syndrome, bleeding infection etc.  Will get scheduled as an outpatient at [facility].  All questions answered.   Lonni DOROTHA Gaskins, MD [facility] [place] Office: [phone]

## 2023-11-24 ENCOUNTER — Other Ambulatory Visit: Payer: Self-pay

## 2023-11-24 DIAGNOSIS — N186 End stage renal disease: Secondary | ICD-10-CM

## 2023-11-29 DIAGNOSIS — Z992 Dependence on renal dialysis: Secondary | ICD-10-CM | POA: Diagnosis not present

## 2023-11-30 ENCOUNTER — Other Ambulatory Visit: Payer: Self-pay

## 2023-11-30 ENCOUNTER — Encounter (HOSPITAL_COMMUNITY): Payer: Self-pay | Admitting: Vascular Surgery

## 2023-11-30 ENCOUNTER — Telehealth: Payer: Self-pay

## 2023-11-30 NOTE — Telephone Encounter (Signed)
 Message left on patient's home phone to advise of [facility] surgery time change.  Patient needs to arrive at 8:30 am [facility].

## 2023-11-30 NOTE — Progress Notes (Signed)
 SDW CALL  Patient was given pre-op  instructions over the phone. The opportunity was given for the patient to ask questions. No further questions asked. Patient verbalized understanding of instructions given.  Date & arrival time- September 10, 8:30 am  PCP - Toribio Jerel MATSU, MD  Cardiologist -   PPM/ICD - denies Device Orders - n/a Rep Notified - n/a  Chest x-ray -  EKG - DOS Stress Test -  ECHO - 11-25-21 Cardiac Cath -   Sleep Study - denies CPAP -   Fasting Blood Sugar - per patient does not check blood sugar any longer, he lost a lot of weight :   Blood Thinner Instructions:denies Aspirin  Instructions:continue  ERAS Protcol -NPO   COVID TEST- n/a   Anesthesia review:no   Patient denies shortness of breath, fever, cough and chest pain over the phone call   All instructions explained to the patient, with a verbal understanding of the material. Patient agrees to go over the instructions while at home for a better understanding.

## 2023-12-01 ENCOUNTER — Ambulatory Visit (HOSPITAL_COMMUNITY)

## 2023-12-01 ENCOUNTER — Encounter: Payer: Self-pay | Admitting: Oncology

## 2023-12-01 ENCOUNTER — Other Ambulatory Visit (HOSPITAL_COMMUNITY): Payer: Self-pay

## 2023-12-01 ENCOUNTER — Other Ambulatory Visit: Payer: Self-pay

## 2023-12-01 ENCOUNTER — Encounter (HOSPITAL_COMMUNITY)
Admission: RE | Disposition: A | Discharge status: Home / Self Care | Payer: Self-pay | Source: Home / Self Care | Attending: Vascular Surgery

## 2023-12-01 ENCOUNTER — Ambulatory Visit (HOSPITAL_COMMUNITY)
Admission: RE | Admit: 2023-12-01 | Discharge: 2023-12-01 | Disposition: A | Discharge status: Home / Self Care | Attending: Vascular Surgery | Admitting: Vascular Surgery

## 2023-12-01 DIAGNOSIS — Z87891 Personal history of nicotine dependence: Secondary | ICD-10-CM | POA: Insufficient documentation

## 2023-12-01 DIAGNOSIS — E1122 Type 2 diabetes mellitus with diabetic chronic kidney disease: Secondary | ICD-10-CM | POA: Insufficient documentation

## 2023-12-01 DIAGNOSIS — N186 End stage renal disease: Secondary | ICD-10-CM | POA: Insufficient documentation

## 2023-12-01 DIAGNOSIS — M1990 Unspecified osteoarthritis, unspecified site: Secondary | ICD-10-CM | POA: Insufficient documentation

## 2023-12-01 DIAGNOSIS — Z992 Dependence on renal dialysis: Secondary | ICD-10-CM | POA: Diagnosis not present

## 2023-12-01 DIAGNOSIS — I120 Hypertensive chronic kidney disease with stage 5 chronic kidney disease or end stage renal disease: Secondary | ICD-10-CM | POA: Diagnosis not present

## 2023-12-01 HISTORY — PX: AV FISTULA PLACEMENT: SHX1204

## 2023-12-01 LAB — POCT I-STAT, CHEM 8
BUN: 12 mg/dL (ref 8–23)
Calcium, Ion: 1.06 mmol/L — ABNORMAL LOW (ref 1.15–1.40)
Chloride: 102 mmol/L (ref 98–111)
Creatinine, Ser: 3.8 mg/dL — ABNORMAL HIGH (ref 0.61–1.24)
Glucose, Bld: 132 mg/dL — ABNORMAL HIGH (ref 70–99)
HCT: 42 % (ref 39.0–52.0)
Hemoglobin: 14.3 g/dL (ref 13.0–17.0)
Potassium: 3.4 mmol/L — ABNORMAL LOW (ref 3.5–5.1)
Sodium: 141 mmol/L (ref 135–145)
TCO2: 25 mmol/L (ref 22–32)

## 2023-12-01 LAB — GLUCOSE, CAPILLARY: Glucose-Capillary: 149 mg/dL — ABNORMAL HIGH (ref 70–99)

## 2023-12-01 SURGERY — ARTERIOVENOUS (AV) FISTULA CREATION
Anesthesia: Regional | Site: Arm Upper | Laterality: Left

## 2023-12-01 MED ORDER — INSULIN ASPART 100 UNIT/ML IJ SOLN: 0.0000 [IU] | INTRAMUSCULAR | Status: DC | PRN

## 2023-12-01 MED ORDER — ORAL CARE MOUTH RINSE: 15.0000 mL | Freq: Once | OROMUCOSAL | Status: AC

## 2023-12-01 MED ORDER — SODIUM CHLORIDE 0.9 % IV SOLN: INTRAVENOUS | Status: DC

## 2023-12-01 MED ORDER — HEPARIN 6000 UNIT IRRIGATION SOLUTION
Status: DC | PRN
  Administered 2023-12-01: 1

## 2023-12-01 MED ORDER — FENTANYL CITRATE (PF) 100 MCG/2ML IJ SOLN
INTRAMUSCULAR | Status: DC | PRN
  Administered 2023-12-01: 50 ug via INTRAVENOUS

## 2023-12-01 MED ORDER — FENTANYL CITRATE (PF) 250 MCG/5ML IJ SOLN: INTRAMUSCULAR | Status: DC | PRN

## 2023-12-01 MED ORDER — HEPARIN 6000 UNIT IRRIGATION SOLUTION
Status: AC
  Filled 2023-12-01: qty 500

## 2023-12-01 MED ORDER — CHLORHEXIDINE GLUCONATE 4 % EX SOLN: 60.0000 mL | Freq: Once | CUTANEOUS | Status: DC

## 2023-12-01 MED ORDER — STERILE WATER FOR IRRIGATION IR SOLN
Status: DC | PRN
  Administered 2023-12-01: 1000 mL

## 2023-12-01 MED ORDER — HYDROCODONE-ACETAMINOPHEN 5-325 MG PO TABS
1.0000 | ORAL_TABLET | Freq: Four times a day (QID) | ORAL | 0 refills | Status: DC | PRN
  Filled 2023-12-01: qty 10, 3d supply, fill #0

## 2023-12-01 MED ORDER — CHLORHEXIDINE GLUCONATE 0.12 % MT SOLN
15.0000 mL | Freq: Once | OROMUCOSAL | Status: AC
  Administered 2023-12-01: 15 mL via OROMUCOSAL
  Filled 2023-12-01: qty 15

## 2023-12-01 MED ORDER — PROPOFOL 500 MG/50ML IV EMUL
INTRAVENOUS | Status: DC | PRN
  Administered 2023-12-01: 100 ug/kg/min via INTRAVENOUS

## 2023-12-01 MED ORDER — CEFAZOLIN SODIUM-DEXTROSE 2-4 GM/100ML-% IV SOLN
2.0000 g | INTRAVENOUS | Status: AC
Start: 2023-12-01 — End: 2023-12-01
  Administered 2023-12-01: 2 g via INTRAVENOUS
  Filled 2023-12-01: qty 100

## 2023-12-01 MED ORDER — HEPARIN SODIUM (PORCINE) 1000 UNIT/ML IJ SOLN
INTRAMUSCULAR | Status: DC | PRN
  Administered 2023-12-01: 5000 [IU] via INTRAVENOUS

## 2023-12-01 MED ORDER — 0.9 % SODIUM CHLORIDE (POUR BTL) OPTIME
TOPICAL | Status: DC | PRN
  Administered 2023-12-01: 1000 mL

## 2023-12-01 MED ORDER — FENTANYL CITRATE (PF) 100 MCG/2ML IJ SOLN
INTRAMUSCULAR | Status: AC
  Filled 2023-12-01: qty 2

## 2023-12-01 SURGICAL SUPPLY — 27 items
ARMBAND PINK RESTRICT EXTREMIT (MISCELLANEOUS) ×4 IMPLANT
BAG COUNTER SPONGE SURGICOUNT (BAG) ×2 IMPLANT
BLADE CLIPPER SURG (BLADE) ×2 IMPLANT
CANISTER SUCTION 3000ML PPV (SUCTIONS) ×2 IMPLANT
CLIP TI MEDIUM 6 (CLIP) ×2 IMPLANT
CLIP TI WIDE RED SMALL 6 (CLIP) ×2 IMPLANT
COVER PROBE W GEL 5X96 (DRAPES) ×2 IMPLANT
DERMABOND ADVANCED .7 DNX12 (GAUZE/BANDAGES/DRESSINGS) ×2 IMPLANT
ELECTRODE REM PT RTRN 9FT ADLT (ELECTROSURGICAL) ×2 IMPLANT
GLOVE BIO SURGEON STRL SZ7.5 (GLOVE) ×2 IMPLANT
GLOVE BIOGEL PI IND STRL 8 (GLOVE) ×2 IMPLANT
GOWN STRL REUS W/ TWL LRG LVL3 (GOWN DISPOSABLE) ×4 IMPLANT
GOWN STRL REUS W/ TWL XL LVL3 (GOWN DISPOSABLE) ×4 IMPLANT
KIT BASIN OR (CUSTOM PROCEDURE TRAY) ×2 IMPLANT
KIT TURNOVER KIT B (KITS) ×2 IMPLANT
NS IRRIG 1000ML POUR BTL (IV SOLUTION) ×2 IMPLANT
PACK CV ACCESS (CUSTOM PROCEDURE TRAY) ×2 IMPLANT
PAD ARMBOARD POSITIONER FOAM (MISCELLANEOUS) ×4 IMPLANT
SLING ARM FOAM STRAP LRG (SOFTGOODS) IMPLANT
SPIKE FLUID TRANSFER (MISCELLANEOUS) ×2 IMPLANT
SUT MNCRL AB 4-0 PS2 18 (SUTURE) ×2 IMPLANT
SUT PROLENE 6 0 BV (SUTURE) ×2 IMPLANT
SUT PROLENE 7 0 BV 1 (SUTURE) IMPLANT
SUT VIC AB 3-0 SH 27X BRD (SUTURE) ×2 IMPLANT
TOWEL GREEN STERILE (TOWEL DISPOSABLE) ×2 IMPLANT
UNDERPAD 30X36 HEAVY ABSORB (UNDERPADS AND DIAPERS) ×2 IMPLANT
WATER STERILE IRR 1000ML POUR (IV SOLUTION) ×2 IMPLANT

## 2023-12-01 NOTE — H&P (Signed)
 History and Physical Interval Note:  12/01/2023 9:50 AM  Devin [name]  has presented today for surgery, with the diagnosis of ESRD.  The various methods of treatment have been discussed with the patient and family. After consideration of risks, benefits and other options for treatment, the patient has consented to  Procedure(s): ARTERIOVENOUS (AV) FISTULA CREATION (Left) INSERTION, GRAFT, ARTERIOVENOUS, UPPER EXTREMITY (Left) as a surgical intervention.  The patient's history has been reviewed, patient examined, no change in status, stable for surgery.  I have reviewed the patient's chart and labs.  Questions were answered to the patient's satisfaction.    Left arm AVF vs graft  Lonni JINNY Gaskins     Patient name: Devin [name]  MRN: [id number]        DOB: [birth year]          Sex: male   REASON FOR VISIT: Discuss permanent hemodialysis access   HPI: Devin [name] is a 78 y.o. male with history of hypertension, diabetes, end-stage renal disease that presents for follow-up to discuss permanent hemodialysis access.Recently underwent left IJ TDC placement by myself on 10/13/2023.  States he has been on dialysis about a month.  Catheter is working well.  He is right-handed.  No other chest wall implants.   Patient has had a prior left brachiocephalic on 12/16/2021 by Dr. Oris.  Unfortunately his fistula failed.       Past Medical History:  Diagnosis Date   Arthritis     CKD (chronic kidney disease) stage 4, GFR 15-29 ml/min (HCC)     Diabetes mellitus without complication (HCC)     Gout     Hypertension                 Past Surgical History:  Procedure Laterality Date   AV FISTULA PLACEMENT Left 12/16/2021    Procedure: LEFT ARM ARTERIOVENOUS (AV) FISTULA CREATION;  Surgeon: Oris Krystal FALCON, MD;  Location: [facility];  Service: Vascular;  Laterality: Left;   BIOPSY   05/26/2022    Procedure: BIOPSY;  Surgeon: Eartha Angelia Sieving, MD;  Location: [facility];  Service:  Gastroenterology;;   COLONOSCOPY WITH PROPOFOL  N/A 05/26/2022    Procedure: COLONOSCOPY WITH PROPOFOL ;  Surgeon: Eartha Angelia Sieving, MD;  Location: [facility];  Service: Gastroenterology;  Laterality: N/A;  200pm, asa 3   DIALYSIS/PERMA CATHETER INSERTION N/A 10/13/2023    Procedure: DIALYSIS/PERMA CATHETER INSERTION;  Surgeon: Gaskins Lonni JINNY, MD;  Location: [facility];  Service: Cardiovascular;  Laterality: N/A;   ESOPHAGOGASTRODUODENOSCOPY (EGD) WITH PROPOFOL  N/A 05/26/2022    Procedure: ESOPHAGOGASTRODUODENOSCOPY (EGD) WITH PROPOFOL ;  Surgeon: Eartha Angelia Sieving, MD;  Location: [facility];  Service: Gastroenterology;  Laterality: N/A;   HEMOSTASIS CLIP PLACEMENT   05/26/2022    Procedure: HEMOSTASIS CLIP PLACEMENT;  Surgeon: Eartha Angelia Sieving, MD;  Location: [facility];  Service: Gastroenterology;;   HYDROCELE EXCISION Right 04/15/2023    Procedure: HYDROCELECTOMY ADULT;  Surgeon: Sherrilee Belvie CROME, MD;  Location: [facility];  Service: Urology;  Laterality: Right;   KNEE SURGERY       POLYPECTOMY   05/26/2022    Procedure: POLYPECTOMY;  Surgeon: Eartha Angelia Sieving, MD;  Location: [facility];  Service: Gastroenterology;;   TONSILLECTOMY                   Family History  Problem Relation Age of Onset   Cancer Father     Stroke Sister     Kidney disease Sister  Heart disease Brother            SOCIAL HISTORY: Social History         Tobacco Use   Smoking status: Former      Current packs/day: 0.00      Types: Cigarettes      Start date: 1973      Quit date: 1993      Years since quitting: 32.6      Passive exposure: Past   Smokeless tobacco: Never  Substance Use Topics   Alcohol use: Never      Allergies  No Known Allergies           Current Outpatient Medications  Medication Sig Dispense Refill   amLODipine  (NORVASC ) 10 MG tablet Take 10 mg by mouth daily.       aspirin  EC 81 MG tablet Take 81 mg by mouth daily. Swallow  whole.       carvedilol  (COREG ) 12.5 MG tablet Take 12.5 mg by mouth 2 (two) times daily with a meal.       Cholecalciferol (VITAMIN D) 50 MCG (2000 UT) CAPS Take 2,000 Units by mouth daily.       cloNIDine  (CATAPRES ) 0.3 MG tablet Take 0.3 mg by mouth in the morning, at noon, and at bedtime.       cyanocobalamin  (VITAMIN B12) 1000 MCG tablet Take 1,000 mcg by mouth once a week.       ferrous sulfate  325 (65 FE) MG tablet Take 1 tablet (325 mg total) by mouth 2 (two) times daily with a meal. 180 tablet 3   furosemide  (LASIX ) 40 MG tablet Take 40 mg by mouth 2 (two) times daily.       hydrALAZINE  (APRESOLINE ) 100 MG tablet Take 100 mg by mouth in the morning, at noon, and at bedtime.       NIFEdipine (PROCARDIA-XL/NIFEDICAL-XL) 30 MG 24 hr tablet Take by mouth.       pantoprazole  (PROTONIX ) 40 MG tablet Take 1 tablet (40 mg total) by mouth 2 (two) times daily for 60 days, THEN 1 tablet (40 mg total) daily. 390 tablet 0   simvastatin  (ZOCOR ) 20 MG tablet Take 20 mg by mouth daily.          No current facility-administered medications for this visit.        REVIEW OF SYSTEMS:  [X]  denotes positive finding, [ ]  denotes negative finding Cardiac   Comments:  Chest pain or chest pressure:      Shortness of breath upon exertion:      Short of breath when lying flat:      Irregular heart rhythm:             Vascular      Pain in calf, thigh, or hip brought on by ambulation:      Pain in feet at night that wakes you up from your sleep:       Blood clot in your veins:      Leg swelling:              Pulmonary      Oxygen at home:      Productive cough:       Wheezing:              Neurologic      Sudden weakness in arms or legs:       Sudden numbness in arms or legs:       Sudden onset of difficulty  speaking or slurred speech:      Temporary loss of vision in one eye:       Problems with dizziness:              Gastrointestinal      Blood in stool:       Vomited blood:               Genitourinary      Burning when urinating:       Blood in urine:             Psychiatric      Major depression:              Hematologic      Bleeding problems:      Problems with blood clotting too easily:             Skin      Rashes or ulcers:             Constitutional      Fever or chills:          PHYSICAL EXAM: There were no vitals filed for this visit.   GENERAL: The patient is a well-nourished male, in no acute distress. The vital signs are documented above. CARDIAC: There is a regular rate and rhythm.  VASCULAR:  2+ palpable radial brachial pulses bilateral upper extremities Left IJ TDC PULMONARY: No respiratory distress. ABDOMEN: Soft and non-tender. MUSCULOSKELETAL: There are no major deformities or cyanosis. NEUROLOGIC: No focal weakness or paresthesias are detected. SKIN: There are no ulcers or rashes noted. PSYCHIATRIC: The patient has a normal affect.   DATA:    UPPER EXTREMITY VEIN MAPPING   Patient Name:  Devin [name]  Date of Exam:   11/16/2023  Medical Rec #: [id number]      Accession #:    [id number]  Date of Birth: April 01, 1945      Patient Gender: M  Patient Age:   54 years  Exam Location:  [address]  Procedure:      VAS US  UPPER EXT VEIN MAPPING (PRE-OP  AVF)  Referring Phys: LONNI GASKINS    ---------------------------------------------------------------------------  -----    Indications: Pre-access.   Performing Technologist: [place] RVT     Examination Guidelines: A complete evaluation includes B-mode imaging,  spectral  Doppler, color Doppler, and power Doppler as needed of all accessible  portions  of each vessel. Bilateral testing is considered an integral part of a  complete  examination. Limited examinations for reoccurring indications may be  performed  as noted.   +-----------------+-------------+----------+--------+  Right Cephalic   Diameter (cm)Depth (cm)Findings   +-----------------+-------------+----------+--------+  Prox upper arm       0.26                         +-----------------+-------------+----------+--------+  Mid upper arm        0.35                         +-----------------+-------------+----------+--------+  Dist upper arm       0.36                         +-----------------+-------------+----------+--------+  Antecubital fossa    0.36                         +-----------------+-------------+----------+--------+  Prox forearm  0.31                         +-----------------+-------------+----------+--------+  Mid forearm          0.27                         +-----------------+-------------+----------+--------+  Dist forearm         0.24                         +-----------------+-------------+----------+--------+   +-----------------+-------------+----------+--------+  Right Basilic    Diameter (cm)Depth (cm)Findings  +-----------------+-------------+----------+--------+  Mid upper arm        0.42                         +-----------------+-------------+----------+--------+  Dist upper arm       0.34                         +-----------------+-------------+----------+--------+  Antecubital fossa    0.27                         +-----------------+-------------+----------+--------+   Right brachial bifurcation at the ACF. Brachial diameter 0.57 cm. Velocity  83 cm/s. Waveform triphasic.  Radial artery diameter 0.57 cm. Velocity 80 cm/s. Waveform triphasic  Ulnar artery diameter 0.27 cm. Velocity 70 cm/s. Waveform triphasic    +--------------+-------------+----------+--------+  Left Basilic  Diameter (cm)Depth (cm)Findings  +--------------+-------------+----------+--------+  Prox upper arm    0.43                         +--------------+-------------+----------+--------+  Mid upper arm     0.34                          +--------------+-------------+----------+--------+  Dist upper arm    0.26                         +--------------+-------------+----------+--------+   Left brachial artery diamer 0.52 cm. Velocity 89 cm/s. Waveform triphasic  Summary: Right: Patent cephalic and basilic veins.         See above for limited arterial evaluation.  Left: Patent basilic veins.        See above for limited arterial evaluation.   *See table(s) above for measurements and observations.      Diagnosing physician: Lonni Gaskins MD  Electronically signed by Lonni Gaskins MD on 11/16/2023 at 2:44:35 PM.    Assessment/Plan:    78 y.o. male with history of hypertension, diabetes, end-stage renal disease that presents for follow-up to discuss permanent hemodialysis access.  Now using a left IJ TDC that was placed last month and is working well.  Patient has had a prior left brachiocephalic on 12/16/2021 by Dr. Oris that failed.   Patient is right-handed.  I have recommended new access.  Vein mapping shows a marginal left basilic vein.  Discussed if basilic vein is not usable will require AV graft.  Discussed the use of basilic vein will be done in 2 stages.  Risk benefits discussed including failure to mature, steal syndrome, bleeding infection etc.  Will get scheduled as an outpatient at [facility].  All questions answered.     Lonni DOROTHA Gaskins, MD [facility] [place] Office: [phone]

## 2023-12-01 NOTE — Discharge Instructions (Signed)

## 2023-12-01 NOTE — Anesthesia Postprocedure Evaluation (Signed)
 Anesthesia Post Note  Patient: Devin [name]  Procedure(s) Performed: FIRST STAGE BRACHIOBASILLIC (AV) FISTULA CREATION (Left: Arm Upper)     Anesthesia Type: Regional Anesthetic complications: no   No notable events documented.  Last Vitals:  Vitals:   12/01/23 0853  BP: (!) 173/72  Pulse: 65  Resp: 17  Temp: 37 C  SpO2: 99%    Last Pain:  Vitals:   12/01/23 0925  TempSrc:   PainSc: 0-No pain                 Jackee Peng

## 2023-12-01 NOTE — Anesthesia Preprocedure Evaluation (Addendum)
 Anesthesia Evaluation  Patient identified by MRN, date of birth, ID band Patient awake    Reviewed: Allergy & Precautions, NPO status , Patient's Chart, lab work & pertinent test results  Airway Mallampati: II  TM Distance: >3 FB Neck ROM: Full    Dental  (+) Dental Advisory Given, Edentulous Upper, Edentulous Lower   Pulmonary former smoker   breath sounds clear to auscultation       Cardiovascular hypertension, Pt. on home beta blockers and Pt. on medications  Rhythm:Regular Rate:Normal     Neuro/Psych negative neurological ROS  negative psych ROS   GI/Hepatic negative GI ROS, Neg liver ROS,,,  Endo/Other  diabetes    Renal/GU Renal disease     Musculoskeletal  (+) Arthritis ,    Abdominal   Peds  Hematology  (+) Blood dyscrasia, anemia   Anesthesia Other Findings   Reproductive/Obstetrics                              Anesthesia Physical Anesthesia Plan  ASA: 3  Anesthesia Plan: Regional   Post-op Pain Management:    Induction: Intravenous  PONV Risk Score and Plan: 2 and Ondansetron  and Propofol  infusion  Airway Management Planned: Nasal Cannula and Natural Airway  Additional Equipment: None  Intra-op Plan:   Post-operative Plan:   Informed Consent: I have reviewed the patients History and Physical, chart, labs and discussed the procedure including the risks, benefits and alternatives for the proposed anesthesia with the patient or authorized representative who has indicated his/her understanding and acceptance.       Plan Discussed with: CRNA  Anesthesia Plan Comments:          Anesthesia Quick Evaluation

## 2023-12-01 NOTE — Anesthesia Procedure Notes (Signed)
 Anesthesia Regional Block: Supraclavicular block   Pre-Anesthetic Checklist: , timeout performed,  Correct Patient, Correct Site, Correct Laterality,  Correct Procedure, Correct Position, site marked,  Risks and benefits discussed,  Surgical consent,  Pre-op  evaluation,  At surgeon's request and post-op pain management  Laterality: Left  Prep: chloraprep       Needles:  Injection technique: Single-shot  Needle Type: Echogenic Stimulator Needle     Needle Length: 9cm  Needle Gauge: 21     Additional Needles:   Procedures:,,,, ultrasound used (permanent image in chart),,    Narrative:  Start time: 12/01/2023 10:15 AM End time: 12/01/2023 10:20 AM Injection made incrementally with aspirations every 5 mL.  Performed by: Personally  Anesthesiologist: Tilford Franky BIRCH, MD  Additional Notes: Discussed risks and benefits of the nerve block in detail, including but not limited vascular injury, permanent nerve damage and infection.   Patient tolerated the procedure well. Local anesthetic introduced in an incremental fashion under minimal resistance after negative aspirations. No paresthesias were elicited. After completion of the procedure, no acute issues were identified and patient continued to be monitored by RN.

## 2023-12-01 NOTE — Transfer of Care (Signed)
 Immediate Anesthesia Transfer of Care Note  Patient: Devin [name]  Procedure(s) Performed: FIRST STAGE BRACHIOBASILLIC (AV) FISTULA CREATION (Left: Arm Upper)  Patient Location: PACU  Anesthesia Type:MAC combined with regional for post-op pain  Level of Consciousness: awake  Airway & Oxygen Therapy: Patient Spontanous Breathing and Patient connected to nasal cannula oxygen  Post-op Assessment: Report given to RN and Post -op Vital signs reviewed and stable  Post vital signs: Reviewed and stable  Last Vitals:  Vitals Value Taken Time  BP 131/61 12/01/23 11:48  Temp    Pulse 61 12/01/23 11:50  Resp 15 12/01/23 11:50  SpO2 100 % 12/01/23 11:50  Vitals shown include unfiled device data.  Last Pain:  Vitals:   12/01/23 0925  TempSrc:   PainSc: 0-No pain         Complications: No notable events documented.

## 2023-12-01 NOTE — Op Note (Signed)
 OPERATIVE NOTE  DATE: December 01, 2023  PROCEDURE: left first stage basilic vein transposition (brachiobasilic arteriovenous fistula) placement  PRE-OPERATIVE DIAGNOSIS: ESRD  POST-OPERATIVE DIAGNOSIS: same  SURGEON: Lonni DOROTHA Gaskins, MD  ASSISTANT(S): OR Staff  ANESTHESIA: regional  ESTIMATED BLOOD LOSS: Minimal  FINDING(S): 1.  Basilic vein: 3.5 mm, acceptable 2.  Brachial artery: 5 mm, atherosclerotic disease evident 3.  Venous outflow: palpable thrill  4.  Radial flow: palpable radial pulse  SPECIMEN(S):  none  INDICATIONS:   Devin [name] is a 78 y.o. male who presents with ESRD and the need for permanent hemodialysis access.  The patient is scheduled for left arm AVF versus AVG.  The patient is aware the risks include but are not limited to: bleeding, infection, steal syndrome, nerve damage, ischemic monomelic neuropathy, failure to mature, and need for additional procedures.  The patient is aware of the risks of the procedure and elects to proceed forward.   DESCRIPTION: After full informed written consent was obtained from the patient, the patient was brought back to the operating room and placed supine upon the operating table.  Prior to induction, the patient received IV antibiotics.   After obtaining adequate anesthesia, the patient was then prepped and draped in the standard fashion for a left arm access procedure.  I turned my attention first to identifying the patient's basilic vein and brachial artery.   The basilic vein looked of good caliber.  Using SonoSite guidance, the location of these vessels were marked out on the skin.   I made a transverse incision at the level of the antecubitum and dissected through the subcutaneous tissue and fascia to gain exposure of the brachial artery.  This was noted to be 5 mm in diameter externally.  This was dissected out proximally and distally and controlled with vessel loops .  I then dissected out the basilic vein.   This was noted to be 3.5 mm in diameter externally.  The distal segment of the vein was ligated with a  2-0 silk, and the vein was transected.  The proximal segment was interrogated with serial dilators.  The vein accepted up to a 4.5 mm dilator without any difficulty.  I then instilled the heparinized saline into the vein and clamped it.  At this point, I reset my exposure of the brachial artery.  The patient was given 5,000 units IV heparin .  I then placed the artery under tension proximally and distally.  I made an arteriotomy with a #11 blade, and then I extended the arteriotomy with a Potts scissor.  I injected heparinized saline proximal and distal to this arteriotomy.  The vein was then sewn to the artery in an end-to-side configuration with a running stitch of 6-0 Prolene.  Prior to completing this anastomosis, I allowed the vein and artery to backbleed.  There was no evidence of clot from any vessels.  I completed the anastomosis in the usual fashion and then released all vessel loops and clamps.    There was a palpable thrill in the venous outflow, and there was a palpable radial pulse.  At this point, I irrigated out the surgical wound.  There was no further active bleeding.  The subcutaneous tissue was reapproximated with a running stitch of 3-0 Vicryl.  The skin was then reapproximated with a running subcuticular stitch of 4-0 Monocryl.  The skin was then cleaned, dried, and reinforced with Dermabond.  The patient tolerated this procedure well.   COMPLICATIONS: None  CONDITION: Stable  Lonni DOROTHA Gaskins MD [facility] Office: [phone]  Lonni JINNY Gaskins   12/01/2023, 11:39 AM

## 2023-12-02 ENCOUNTER — Encounter (HOSPITAL_COMMUNITY): Payer: Self-pay | Admitting: Vascular Surgery

## 2023-12-07 ENCOUNTER — Other Ambulatory Visit: Payer: Self-pay | Admitting: *Deleted

## 2023-12-07 DIAGNOSIS — N186 End stage renal disease: Secondary | ICD-10-CM

## 2023-12-14 DIAGNOSIS — N185 Chronic kidney disease, stage 5: Secondary | ICD-10-CM | POA: Diagnosis not present

## 2023-12-14 DIAGNOSIS — D631 Anemia in chronic kidney disease: Secondary | ICD-10-CM | POA: Diagnosis not present

## 2023-12-14 DIAGNOSIS — D519 Vitamin B12 deficiency anemia, unspecified: Secondary | ICD-10-CM | POA: Diagnosis not present

## 2023-12-14 DIAGNOSIS — I10 Essential (primary) hypertension: Secondary | ICD-10-CM | POA: Diagnosis not present

## 2023-12-14 DIAGNOSIS — N189 Chronic kidney disease, unspecified: Secondary | ICD-10-CM | POA: Diagnosis not present

## 2023-12-14 DIAGNOSIS — E1122 Type 2 diabetes mellitus with diabetic chronic kidney disease: Secondary | ICD-10-CM | POA: Diagnosis not present

## 2023-12-14 DIAGNOSIS — B351 Tinea unguium: Secondary | ICD-10-CM | POA: Diagnosis not present

## 2023-12-14 DIAGNOSIS — K219 Gastro-esophageal reflux disease without esophagitis: Secondary | ICD-10-CM | POA: Diagnosis not present

## 2023-12-14 DIAGNOSIS — I5032 Chronic diastolic (congestive) heart failure: Secondary | ICD-10-CM | POA: Diagnosis not present

## 2023-12-14 DIAGNOSIS — E782 Mixed hyperlipidemia: Secondary | ICD-10-CM | POA: Diagnosis not present

## 2023-12-28 ENCOUNTER — Ambulatory Visit (INDEPENDENT_AMBULATORY_CARE_PROVIDER_SITE_OTHER)

## 2023-12-28 DIAGNOSIS — N186 End stage renal disease: Secondary | ICD-10-CM

## 2024-01-03 DIAGNOSIS — Z992 Dependence on renal dialysis: Secondary | ICD-10-CM | POA: Diagnosis not present

## 2024-01-03 DIAGNOSIS — E1122 Type 2 diabetes mellitus with diabetic chronic kidney disease: Secondary | ICD-10-CM | POA: Diagnosis not present

## 2024-01-03 DIAGNOSIS — N186 End stage renal disease: Secondary | ICD-10-CM | POA: Diagnosis not present

## 2024-01-11 DIAGNOSIS — I5032 Chronic diastolic (congestive) heart failure: Secondary | ICD-10-CM | POA: Diagnosis not present

## 2024-01-11 DIAGNOSIS — E1122 Type 2 diabetes mellitus with diabetic chronic kidney disease: Secondary | ICD-10-CM | POA: Diagnosis not present

## 2024-01-11 DIAGNOSIS — B351 Tinea unguium: Secondary | ICD-10-CM | POA: Diagnosis not present

## 2024-01-11 DIAGNOSIS — N189 Chronic kidney disease, unspecified: Secondary | ICD-10-CM | POA: Diagnosis not present

## 2024-01-11 DIAGNOSIS — N185 Chronic kidney disease, stage 5: Secondary | ICD-10-CM | POA: Diagnosis not present

## 2024-01-11 DIAGNOSIS — D631 Anemia in chronic kidney disease: Secondary | ICD-10-CM | POA: Diagnosis not present

## 2024-01-11 DIAGNOSIS — E782 Mixed hyperlipidemia: Secondary | ICD-10-CM | POA: Diagnosis not present

## 2024-01-11 DIAGNOSIS — I10 Essential (primary) hypertension: Secondary | ICD-10-CM | POA: Diagnosis not present

## 2024-01-11 DIAGNOSIS — D519 Vitamin B12 deficiency anemia, unspecified: Secondary | ICD-10-CM | POA: Diagnosis not present

## 2024-01-11 DIAGNOSIS — K219 Gastro-esophageal reflux disease without esophagitis: Secondary | ICD-10-CM | POA: Diagnosis not present

## 2024-01-18 ENCOUNTER — Ambulatory Visit (INDEPENDENT_AMBULATORY_CARE_PROVIDER_SITE_OTHER): Admitting: Physician Assistant

## 2024-01-18 VITALS — BP 157/68 | Ht 69.0 in | Wt 200.0 lb

## 2024-01-18 DIAGNOSIS — N186 End stage renal disease: Secondary | ICD-10-CM

## 2024-01-18 NOTE — Progress Notes (Signed)
 POST OPERATIVE OFFICE NOTE    CC:  F/u for surgery  HPI:  This is a 78 y.o. male who is s/p left arm first stage basilic vein fistula creation by Dr. Gretta on 12/01/2023.  He denies any signs or symptoms of steal syndrome in his left hand.  He believes his left arm incision is well-healed.  He began dialysis in July of this year.  He is dialyzing from left IJ TDC without any problems.  His kidney center is here in [place].  Allergies  Allergen Reactions   Amlodipine      Other Reaction(s): Not available   Aspirin      Current Outpatient Medications  Medication Sig Dispense Refill   aspirin  EC 81 MG tablet Take 81 mg by mouth daily. Swallow whole.     carvedilol  (COREG ) 12.5 MG tablet Take 12.5 mg by mouth 2 (two) times daily with a meal.     Cholecalciferol (VITAMIN D) 50 MCG (2000 UT) CAPS Take 2,000 Units by mouth daily.     cloNIDine  (CATAPRES ) 0.3 MG tablet Take 0.3 mg by mouth in the morning, at noon, and at bedtime.     Cyanocobalamin  (VITAMIN B-12) 5000 MCG TBDP Take 5,000 mcg by mouth once a week.     ferrous sulfate  325 (65 FE) MG tablet Take 1 tablet (325 mg total) by mouth 2 (two) times daily with a meal. (Patient taking differently: Take 325 mg by mouth daily with breakfast.) 180 tablet 3   furosemide  (LASIX ) 40 MG tablet Take 40 mg by mouth 2 (two) times daily.     hydrALAZINE  (APRESOLINE ) 100 MG tablet Take 100 mg by mouth in the morning, at noon, and at bedtime.     HYDROcodone -acetaminophen  (NORCO/VICODIN) 5-325 MG tablet Take 1 tablet by mouth every 6 (six) hours as needed for moderate pain (pain score 4-6). 10 tablet 0   NIFEdipine (PROCARDIA-XL/NIFEDICAL-XL) 30 MG 24 hr tablet Take 30 mg by mouth 2 (two) times daily. Empty stomach     simvastatin  (ZOCOR ) 20 MG tablet Take 20 mg by mouth in the morning.     No current facility-administered medications for this visit.     ROS:  See HPI  Physical Exam:  Vitals:   01/18/24 0901  BP: (!) 157/68  Weight: 200 lb  (90.7 kg)  Height: 5' 9 (1.753 m)    Incision: Left arm incision well-healed Extremities: Palpable left radial pulse; palpable thrill at the anastomosis which becomes more difficult to feel as he moves up the arm Neuro: A&O; grip strength symmetrical  Assessment/Plan:  This is a 78 y.o. male who is s/p: Left arm first stage basilic vein fistula creation  Left hand is well-perfused with a palpable radial pulse and no signs or symptoms of steal syndrome.  He has a patent basilic vein fistula with a palpable thrill at the anastomosis.  Duplex demonstrates a patent but slow to mature fistula.  We discussed using the left arm to perform ADLs at home.  He will also exercise the left hand with a stress ball as much as possible.  We will repeat the duplex in 1 month.  If the fistula has adequately matured we will proceed with a second stage basilic vein transposition.  If the fistula still has not matured he may require fistulogram with balloon assisted maturation.  Continue HD via left IJ TDC.  We will see him with imaging in 1 month.   Donnice Sender, PA-C [facility] [place] [phone]

## 2024-01-20 ENCOUNTER — Other Ambulatory Visit: Payer: Self-pay | Admitting: *Deleted

## 2024-01-20 DIAGNOSIS — N186 End stage renal disease: Secondary | ICD-10-CM

## 2024-01-27 ENCOUNTER — Encounter (INDEPENDENT_AMBULATORY_CARE_PROVIDER_SITE_OTHER): Payer: Self-pay | Admitting: Gastroenterology

## 2024-02-01 ENCOUNTER — Inpatient Hospital Stay: Attending: Hematology

## 2024-02-01 DIAGNOSIS — E611 Iron deficiency: Secondary | ICD-10-CM | POA: Insufficient documentation

## 2024-02-01 DIAGNOSIS — N186 End stage renal disease: Secondary | ICD-10-CM | POA: Insufficient documentation

## 2024-02-01 DIAGNOSIS — D631 Anemia in chronic kidney disease: Secondary | ICD-10-CM | POA: Insufficient documentation

## 2024-02-01 DIAGNOSIS — Z992 Dependence on renal dialysis: Secondary | ICD-10-CM | POA: Insufficient documentation

## 2024-02-03 ENCOUNTER — Inpatient Hospital Stay

## 2024-02-03 DIAGNOSIS — D509 Iron deficiency anemia, unspecified: Secondary | ICD-10-CM

## 2024-02-03 DIAGNOSIS — E611 Iron deficiency: Secondary | ICD-10-CM | POA: Diagnosis not present

## 2024-02-03 DIAGNOSIS — E538 Deficiency of other specified B group vitamins: Secondary | ICD-10-CM

## 2024-02-03 DIAGNOSIS — Z992 Dependence on renal dialysis: Secondary | ICD-10-CM | POA: Diagnosis not present

## 2024-02-03 DIAGNOSIS — D631 Anemia in chronic kidney disease: Secondary | ICD-10-CM | POA: Diagnosis present

## 2024-02-03 DIAGNOSIS — N186 End stage renal disease: Secondary | ICD-10-CM | POA: Diagnosis present

## 2024-02-03 LAB — CBC WITH DIFFERENTIAL/PLATELET
Abs Immature Granulocytes: 0.02 K/uL (ref 0.00–0.07)
Basophils Absolute: 0 K/uL (ref 0.0–0.1)
Basophils Relative: 1 %
Eosinophils Absolute: 0.4 K/uL (ref 0.0–0.5)
Eosinophils Relative: 5 %
HCT: 42.6 % (ref 39.0–52.0)
Hemoglobin: 13.6 g/dL (ref 13.0–17.0)
Immature Granulocytes: 0 %
Lymphocytes Relative: 25 %
Lymphs Abs: 1.7 K/uL (ref 0.7–4.0)
MCH: 29.4 pg (ref 26.0–34.0)
MCHC: 31.9 g/dL (ref 30.0–36.0)
MCV: 92.2 fL (ref 80.0–100.0)
Monocytes Absolute: 0.5 K/uL (ref 0.1–1.0)
Monocytes Relative: 7 %
Neutro Abs: 4.3 K/uL (ref 1.7–7.7)
Neutrophils Relative %: 62 %
Platelets: 239 K/uL (ref 150–400)
RBC: 4.62 MIL/uL (ref 4.22–5.81)
RDW: 14.6 % (ref 11.5–15.5)
WBC: 6.9 K/uL (ref 4.0–10.5)
nRBC: 0 % (ref 0.0–0.2)

## 2024-02-03 LAB — COMPREHENSIVE METABOLIC PANEL WITH GFR
ALT: 8 U/L (ref 0–44)
AST: 20 U/L (ref 15–41)
Albumin: 3.9 g/dL (ref 3.5–5.0)
Alkaline Phosphatase: 86 U/L (ref 38–126)
Anion gap: 15 (ref 5–15)
BUN: 32 mg/dL — ABNORMAL HIGH (ref 8–23)
CO2: 25 mmol/L (ref 22–32)
Calcium: 8.3 mg/dL — ABNORMAL LOW (ref 8.9–10.3)
Chloride: 99 mmol/L (ref 98–111)
Creatinine, Ser: 5.65 mg/dL — ABNORMAL HIGH (ref 0.61–1.24)
GFR, Estimated: 10 mL/min — ABNORMAL LOW (ref >60)
Glucose, Bld: 208 mg/dL — ABNORMAL HIGH (ref 70–99)
Potassium: 4.4 mmol/L (ref 3.5–5.1)
Sodium: 139 mmol/L (ref 135–145)
Total Bilirubin: 0.3 mg/dL (ref 0.0–1.2)
Total Protein: 7.3 g/dL (ref 6.5–8.1)

## 2024-02-03 LAB — IRON AND TIBC
Iron: 65 ug/dL (ref 45–182)
Saturation Ratios: 26 % (ref 17.9–39.5)
TIBC: 248 ug/dL — ABNORMAL LOW (ref 250–450)
UIBC: 183 ug/dL

## 2024-02-03 LAB — FERRITIN: Ferritin: 473 ng/mL — ABNORMAL HIGH (ref 24–336)

## 2024-02-07 NOTE — Progress Notes (Unsigned)
 VIRTUAL VISIT via TELEPHONE NOTE [facility]   I connected with Devin [name]  on 02/08/24 at  10:00 AM by telephone and verified that I am speaking with the correct person using two identifiers.  Location: Patient: Home Provider: Home office   I discussed the limitations, risks, security and privacy concerns of performing an evaluation and management service by telephone and the availability of in person appointments. I also discussed with the patient that there may be a patient responsible charge related to this service. The patient expressed understanding and agreed to proceed.  REASON FOR VISIT:  Follow-up for normocytic anemia  CURRENT THERAPY: IV iron, B12, Rectrit  INTERVAL HISTORY:   Devin [name] 78 y.o. [name] returns for routine follow-up of normocytic anemia.   He was last seen by NP Delon Hope on 10/12/2023.  Patient started dialysis in July 2025.   He is no longer receiving the Retacrit  injections at [facility], with anemia from ESRD being managed by nephrologist.   He continues to deny any rectal bleeding, melena, epistaxis, or other sources of blood loss.   He reports improved energy after starting dialysis.SABRA    He denies any abnormal headaches, chest pain, dyspnea, lightheadedness, or syncope.  He is taking B12 every other day, but has not been taking iron pill since it was held at last visit.  He has 75% energy and 100% appetite.   He endorses that he is maintaining a stable weight.  ASSESSMENT & PLAN:  1.  Normocytic anemia, secondary to ESRD and iron deficiency - Patient has required intermittent IV iron (received 1 g parenteral iron at [facility] in June 2023).  Most recently received Feraheme  x 1 on 03/25/2022. - Stool cards positive for blood.  EGD/colonoscopy on 05/26/2022 revealed gastric polyps with stigmata of recent bleeding, colon polyps, nonbleeding internal hemorrhoids, and diverticulosis - He follows with Dr. Rachele for CKD stage V /  ESRD. - ESA therapy with Retacrit  initiated on 07/23/2022.  Current dose is Retacrit  10,000 units every 2 weeks.  - Immunofixation shows polyclonal increase in immunoglobulins.  Light chain ratio elevated at 2.73 (kappa 216.5, lambda 79.2).  SPEP negative for M spike in December 2023. - Labs from 05/21/2023 showed significant drop in hemoglobin (Hgb 8.9).  Given Retacrit  injection same day (05/21/2023), prior to that he had not required Retacrit  injections since 01/13/2023. - Additional labs (05/21/2023): Creatinine 5.21/GFR 10 (CKD stage V), ferritin 378, iron saturation 23%. - Patient started dialysis in July 2025.   - Most recent labs (02/03/2024) Hgb 13.6/MCV 92.2 Ferritin 473, iron saturation 26% Creatinine 5.65/GFR 10 - Denies any bleeding per rectum or melena.  Denies any ice pica. - DIFFERENTIAL DIAGNOSIS favors combination anemia from CKD, relative iron deficiency, and B12 deficiency - PLAN: Now that patient has started dialysis, he is receiving ESA and IV iron as needed via nephrology at the dialysis center.  No indication for ongoing hematology follow-up.  Discussed with his nephrologist (Dr. Rachele), who agrees. - [facility] at this time, but patient can certainly be referred back to us  in the future if needed.  2.  B12 deficiency: - Labs from 11/28/2021 show low vitamin B12 at 177, with MMA elevated at 562 - Patient is taking vitamin B12 1000 mcg  every other day - Most recent labs (05/21/2023): Normal B12 673, MMA normal. - PLAN: Continue vitamin B12 1000 mcg every other day.     3.  CKD stage V -  Follows with Dr. Rachele - Left brachiocephalic AV fistula placed on 0/73/7976 - He has progressive renal insufficiency, but not yet on dialysis   4.  Social/family history: - Other PMH: Hypertension, diabetes, arthritis, CKD stage V - He is a retired producer, television/film/video and is independent of ADLs and IADLs.  Quit smoking cigarettes 30 years ago. - Father had  prostate cancer.  PLAN SUMMARY:  >> Tentative discharge from clinic, no follow-up visits needed at this time     REVIEW OF SYSTEMS:   Review of Systems  Constitutional:  Negative for chills, diaphoresis, fever, malaise/fatigue and weight loss.  Respiratory:  Negative for cough and shortness of breath.   Cardiovascular:  Negative for chest pain and palpitations.  Gastrointestinal:  Negative for abdominal pain, blood in stool, melena, nausea and vomiting.  Neurological:  Negative for dizziness and headaches.     PHYSICAL EXAM: (per limitations of virtual telephone visit)  The patient is alert and oriented x 3, exhibiting adequate mentation, good mood, and ability to speak in full sentences and execute sound judgement.  WRAP UP:   I discussed the assessment and treatment plan with the patient. The patient was provided an opportunity to ask questions and all were answered. The patient agreed with the plan and demonstrated an understanding of the instructions.   The patient was advised to call back or seek an in-person evaluation if the symptoms worsen or if the condition fails to improve as anticipated.  I provided 15 minutes of non-face-to-face time during this encounter, including 10 minutes of medical discussion.  Pleasant CHRISTELLA Barefoot, PA-C 02/08/24 10:17 AM

## 2024-02-08 ENCOUNTER — Inpatient Hospital Stay (HOSPITAL_BASED_OUTPATIENT_CLINIC_OR_DEPARTMENT_OTHER): Admitting: Physician Assistant

## 2024-02-08 DIAGNOSIS — N185 Chronic kidney disease, stage 5: Secondary | ICD-10-CM | POA: Diagnosis not present

## 2024-02-08 DIAGNOSIS — D631 Anemia in chronic kidney disease: Secondary | ICD-10-CM | POA: Diagnosis not present

## 2024-02-11 ENCOUNTER — Ambulatory Visit (HOSPITAL_COMMUNITY)
Admission: RE | Admit: 2024-02-11 | Discharge: 2024-02-11 | Disposition: A | Discharge status: Home / Self Care | Source: Ambulatory Visit | Attending: Physician Assistant | Admitting: Physician Assistant

## 2024-02-11 DIAGNOSIS — N186 End stage renal disease: Secondary | ICD-10-CM | POA: Diagnosis present

## 2024-02-15 ENCOUNTER — Ambulatory Visit: Admitting: Physician Assistant

## 2024-02-15 VITALS — BP 149/69 | HR 67 | Ht 69.0 in | Wt 200.0 lb

## 2024-02-15 DIAGNOSIS — N186 End stage renal disease: Secondary | ICD-10-CM

## 2024-02-15 NOTE — Progress Notes (Signed)
 Office Note     CC:  follow up Requesting Provider:  Toribio Jerel MATSU, MD  HPI: Devin [name] is a 78 y.o. ([birth year]) male who presents for evaluation of his left arm first stage basilic vein fistula.  This was created by Dr. Gretta on 12/01/2023.  He was brought back for repeat ultrasound due to fistula being slow to mature.  He has been using his left arm to perform activities of daily living as instructed.  He continues to dialyze via [facility].  He denies any steal symptoms in his left hand.   Past Medical History:  Diagnosis Date   Arthritis    CKD (chronic kidney disease) stage 4, GFR 15-29 ml/min (HCC)    Diabetes mellitus without complication (HCC)    Gout    Hypertension     Past Surgical History:  Procedure Laterality Date   AV FISTULA PLACEMENT Left 12/16/2021   Procedure: LEFT ARM ARTERIOVENOUS (AV) FISTULA CREATION;  Surgeon: Oris Krystal FALCON, MD;  Location: [facility];  Service: Vascular;  Laterality: Left;   AV FISTULA PLACEMENT Left 12/01/2023   Procedure: FIRST STAGE BRACHIOBASILLIC (AV) FISTULA CREATION;  Surgeon: Gretta Lonni PARAS, MD;  Location: [facility] OR;  Service: Vascular;  Laterality: Left;   BIOPSY  05/26/2022   Procedure: BIOPSY;  Surgeon: Eartha Angelia Toribio, MD;  Location: [facility];  Service: Gastroenterology;;   COLONOSCOPY WITH PROPOFOL  N/A 05/26/2022   Procedure: COLONOSCOPY WITH PROPOFOL ;  Surgeon: Eartha Angelia Toribio, MD;  Location: [facility];  Service: Gastroenterology;  Laterality: N/A;  200pm, asa 3   DIALYSIS/PERMA CATHETER INSERTION N/A 10/13/2023   Procedure: DIALYSIS/PERMA CATHETER INSERTION;  Surgeon: Gretta Lonni PARAS, MD;  Location: [facility];  Service: Cardiovascular;  Laterality: N/A;   ESOPHAGOGASTRODUODENOSCOPY (EGD) WITH PROPOFOL  N/A 05/26/2022   Procedure: ESOPHAGOGASTRODUODENOSCOPY (EGD) WITH PROPOFOL ;  Surgeon: Eartha Angelia Toribio, MD;  Location: [facility];  Service: Gastroenterology;  Laterality: N/A;   HEMOSTASIS CLIP  PLACEMENT  05/26/2022   Procedure: HEMOSTASIS CLIP PLACEMENT;  Surgeon: Eartha Angelia Toribio, MD;  Location: [facility];  Service: Gastroenterology;;   HYDROCELE EXCISION Right 04/15/2023   Procedure: HYDROCELECTOMY ADULT;  Surgeon: Sherrilee Belvie CROME, MD;  Location: [facility];  Service: Urology;  Laterality: Right;   KNEE SURGERY     POLYPECTOMY  05/26/2022   Procedure: POLYPECTOMY;  Surgeon: Eartha Angelia, Toribio, MD;  Location: [facility];  Service: Gastroenterology;;   TONSILLECTOMY      Social History   Socioeconomic History   Marital status: Married    Spouse name: Not on file   Number of children: Not on file   Years of education: Not on file   Highest education level: Not on file  Occupational History   Not on file  Tobacco Use   Smoking status: Former    Current packs/day: 0.00    Types: Cigarettes    Start date: 24    Quit date: 1993    Years since quitting: 32.9    Passive exposure: Past   Smokeless tobacco: Never  Vaping Use   Vaping status: Never Used  Substance and Sexual Activity   Alcohol use: Never   Drug use: Never   Sexual activity: Yes  Other Topics Concern   Not on file  Social History Narrative   Not on file   Social Drivers of Health   Financial Resource Strain: Not on file  Food Insecurity: Not on file  Transportation Needs: Not on file  Physical Activity: Not  on file  Stress: Not on file  Social Connections: Not on file  Intimate Partner Violence: Not on file    Family History  Problem Relation Age of Onset   Cancer Father    Stroke Sister    Kidney disease Sister    Heart disease Brother     Current Outpatient Medications  Medication Sig Dispense Refill   aspirin  EC 81 MG tablet Take 81 mg by mouth daily. Swallow whole.     carvedilol  (COREG ) 12.5 MG tablet Take 12.5 mg by mouth 2 (two) times daily with a meal.     Cholecalciferol (VITAMIN D) 50 MCG (2000 UT) CAPS Take 2,000 Units by mouth daily.     cloNIDine   (CATAPRES ) 0.3 MG tablet Take 0.3 mg by mouth in the morning, at noon, and at bedtime.     Cyanocobalamin  (VITAMIN B-12) 5000 MCG TBDP Take 5,000 mcg by mouth once a week.     ferrous sulfate  325 (65 FE) MG tablet Take 1 tablet (325 mg total) by mouth 2 (two) times daily with a meal. (Patient taking differently: Take 325 mg by mouth daily with breakfast.) 180 tablet 3   furosemide  (LASIX ) 40 MG tablet Take 40 mg by mouth 2 (two) times daily.     hydrALAZINE  (APRESOLINE ) 100 MG tablet Take 100 mg by mouth in the morning, at noon, and at bedtime.     HYDROcodone -acetaminophen  (NORCO/VICODIN) 5-325 MG tablet Take 1 tablet by mouth every 6 (six) hours as needed for moderate pain (pain score 4-6). 10 tablet 0   NIFEdipine (PROCARDIA-XL/NIFEDICAL-XL) 30 MG 24 hr tablet Take 30 mg by mouth 2 (two) times daily. Empty stomach     simvastatin  (ZOCOR ) 20 MG tablet Take 20 mg by mouth in the morning.     No current facility-administered medications for this visit.    Allergies  Allergen Reactions   Amlodipine      Other Reaction(s): Not available   Aspirin       REVIEW OF SYSTEMS:   [X]  denotes positive finding, [ ]  denotes negative finding Cardiac  Comments:  Chest pain or chest pressure:    Shortness of breath upon exertion:    Short of breath when lying flat:    Irregular heart rhythm:        Vascular    Pain in calf, thigh, or hip brought on by ambulation:    Pain in feet at night that wakes you up from your sleep:     Blood clot in your veins:    Leg swelling:         Pulmonary    Oxygen at home:    Productive cough:     Wheezing:         Neurologic    Sudden weakness in arms or legs:     Sudden numbness in arms or legs:     Sudden onset of difficulty speaking or slurred speech:    Temporary loss of vision in one eye:     Problems with dizziness:         Gastrointestinal    Blood in stool:     Vomited blood:         Genitourinary    Burning when urinating:     Blood in  urine:        Psychiatric    Major depression:         Hematologic    Bleeding problems:    Problems with blood clotting too easily:  Skin    Rashes or ulcers:        Constitutional    Fever or chills:      PHYSICAL EXAMINATION:  Vitals:   02/15/24 1359  BP: (!) 149/69  Pulse: 67  Weight: 200 lb (90.7 kg)  Height: 5' 9 (1.753 m)    General:  WDWN in NAD; vital signs documented above Gait: Not observed HENT: WNL, normocephalic Pulmonary: normal non-labored breathing Cardiac: regular HR Abdomen: soft, NT, no masses Skin: without rashes Vascular Exam/Pulses: Palpable left radial pulse; easily palpable thrill near the anastomosis which becomes stiff more difficult to feel as the fistula runs deep in the mid and upper arm Extremities: without ischemic changes, without Gangrene , without cellulitis; without open wounds;  Musculoskeletal: no muscle wasting or atrophy  Neurologic: A&O X 3 Psychiatric:  The pt has Normal affect.   Non-Invasive Vascular Imaging:   Over 800 mm/s flow volume Fistula is measuring 5-1/2 mm in the mid upper arm and 8 mm in the upper upper arm; still measuring around 3 mm near the anastomosis in the distal upper arm    ASSESSMENT/PLAN:: 78 y.o. male here for follow up for repeat duplex of the left arm first stage basilic vein fistula  Mr. Ambrosia returns for repeat duplex of the left arm fistula due to the fistula being slow to mature.  On exam he has an easily palpable thrill near the arterial anastomosis which is improved from last office visit.  The diameter of the fistula has also slightly improved however still has a small diameter in the distal upper arm.  We discussed 2 options moving forward.  The first option would be to perform a fistulogram to see if there are any concerns with the fistula prior to proceeding with second stage basilic vein transposition.  The second option is to proceed directly to second stage basilic vein  transposition given that the duplex and physical exam have improved over the last month.  This case will be discussed with Dr. Gretta.  The patient will then be called over the phone to schedule either the fistulogram or second stage basilic vein transposition.  Both procedures were discussed with him and his daughter Neville during today's office visit and they are both agreeable to proceed with either option.  ADDENDUM 11/26: This patient was discussed with Dr. Gretta and duplex was reviewed.  We will proceed with second stage basilic vein transposition.  No need for fistulogram prior to surgery.  Surgery will be scheduled over the phone by Alan Glance RN  Donnice Sender, PA-C [facility] [phone]

## 2024-02-16 ENCOUNTER — Other Ambulatory Visit: Payer: Self-pay

## 2024-02-16 DIAGNOSIS — N186 End stage renal disease: Secondary | ICD-10-CM

## 2024-03-03 ENCOUNTER — Encounter (HOSPITAL_COMMUNITY): Payer: Self-pay | Admitting: Vascular Surgery

## 2024-03-03 ENCOUNTER — Other Ambulatory Visit: Payer: Self-pay

## 2024-03-03 NOTE — Progress Notes (Addendum)
 SDW CALL  Patient was given pre-op  instructions over the phone. The opportunity was given for the patient to ask questions. No further questions asked. Patient verbalized understanding of instructions given.   PCP - Jerel Daniel,MD Cardiologist - denies Nephrologist - Manpreet Bhutani,MBBS  PPM/ICD - denies Device Orders -  Rep Notified -   Chest x-ray - na EKG - 12/01/23 Stress Test -  ECHO - 11/25/21 Cardiac Cath - denies  Sleep Study - denies CPAP - no  Fasting Blood Sugar - pt doesn't check his blood sugar at home. He agrees to check it the morning of surgery . He was instructed on the hypoglycemic protocol -treat blood sugar <70 with 1/2 cup apple juice;recheck in 15 minutes;call [phone] if blood sugar is not >70 after treatment. Checks Blood Sugar _____ times a day  Blood Thinner Instructions:na Aspirin  Instructions:continue  ERAS Protcol -NPO PRE-SURGERY Ensure or G2-   COVID TEST- na   Anesthesia review: no  Patient denies shortness of breath, fever, cough and chest pain over the phone call  Surgical Instructions   Special instructions:    Oral Hygiene is also important to reduce your risk of infection.  Remember - BRUSH YOUR TEETH THE MORNING OF SURGERY WITH YOUR REGULAR TOOTHPASTE

## 2024-03-06 ENCOUNTER — Ambulatory Visit (HOSPITAL_COMMUNITY): Admitting: Anesthesiology

## 2024-03-06 ENCOUNTER — Other Ambulatory Visit: Payer: Self-pay

## 2024-03-06 ENCOUNTER — Ambulatory Visit (HOSPITAL_COMMUNITY)
Admission: RE | Admit: 2024-03-06 | Discharge: 2024-03-06 | Disposition: A | Attending: Vascular Surgery | Admitting: Vascular Surgery

## 2024-03-06 ENCOUNTER — Encounter (HOSPITAL_COMMUNITY): Admission: RE | Disposition: A | Payer: Self-pay | Source: Home / Self Care | Attending: Vascular Surgery

## 2024-03-06 ENCOUNTER — Encounter (HOSPITAL_COMMUNITY): Payer: Self-pay | Admitting: Vascular Surgery

## 2024-03-06 DIAGNOSIS — E1122 Type 2 diabetes mellitus with diabetic chronic kidney disease: Secondary | ICD-10-CM | POA: Diagnosis not present

## 2024-03-06 DIAGNOSIS — N186 End stage renal disease: Secondary | ICD-10-CM | POA: Insufficient documentation

## 2024-03-06 DIAGNOSIS — T82590A Other mechanical complication of surgically created arteriovenous fistula, initial encounter: Secondary | ICD-10-CM | POA: Diagnosis not present

## 2024-03-06 DIAGNOSIS — Z87891 Personal history of nicotine dependence: Secondary | ICD-10-CM | POA: Diagnosis not present

## 2024-03-06 DIAGNOSIS — I120 Hypertensive chronic kidney disease with stage 5 chronic kidney disease or end stage renal disease: Secondary | ICD-10-CM | POA: Insufficient documentation

## 2024-03-06 DIAGNOSIS — I272 Pulmonary hypertension, unspecified: Secondary | ICD-10-CM | POA: Insufficient documentation

## 2024-03-06 DIAGNOSIS — M1990 Unspecified osteoarthritis, unspecified site: Secondary | ICD-10-CM | POA: Insufficient documentation

## 2024-03-06 DIAGNOSIS — Z992 Dependence on renal dialysis: Secondary | ICD-10-CM | POA: Insufficient documentation

## 2024-03-06 DIAGNOSIS — Z7982 Long term (current) use of aspirin: Secondary | ICD-10-CM | POA: Insufficient documentation

## 2024-03-06 DIAGNOSIS — Z79899 Other long term (current) drug therapy: Secondary | ICD-10-CM | POA: Diagnosis not present

## 2024-03-06 HISTORY — PX: LIGATION OF ARTERIOVENOUS  FISTULA: SHX5948

## 2024-03-06 HISTORY — PX: INSERTION OF ARTERIOVENOUS (AV) ARTEGRAFT ARM: SHX6779

## 2024-03-06 LAB — POCT I-STAT, CHEM 8
BUN: 48 mg/dL — ABNORMAL HIGH (ref 8–23)
Calcium, Ion: 0.96 mmol/L — ABNORMAL LOW (ref 1.15–1.40)
Chloride: 103 mmol/L (ref 98–111)
Creatinine, Ser: 7.3 mg/dL — ABNORMAL HIGH (ref 0.61–1.24)
Glucose, Bld: 119 mg/dL — ABNORMAL HIGH (ref 70–99)
HCT: 34 % — ABNORMAL LOW (ref 39.0–52.0)
Hemoglobin: 11.6 g/dL — ABNORMAL LOW (ref 13.0–17.0)
Potassium: 4.2 mmol/L (ref 3.5–5.1)
Sodium: 142 mmol/L (ref 135–145)
TCO2: 25 mmol/L (ref 22–32)

## 2024-03-06 LAB — GLUCOSE, CAPILLARY
Glucose-Capillary: 105 mg/dL — ABNORMAL HIGH (ref 70–99)
Glucose-Capillary: 119 mg/dL — ABNORMAL HIGH (ref 70–99)
Glucose-Capillary: 136 mg/dL — ABNORMAL HIGH (ref 70–99)

## 2024-03-06 SURGERY — LIGATION OF ARTERIOVENOUS  FISTULA
Anesthesia: Monitor Anesthesia Care | Site: Arm Upper | Laterality: Left

## 2024-03-06 MED ORDER — ORAL CARE MOUTH RINSE: 15.0000 mL | Freq: Once | OROMUCOSAL | Status: AC

## 2024-03-06 MED ORDER — FENTANYL CITRATE (PF) 100 MCG/2ML IJ SOLN: 50.0000 ug | Freq: Once | INTRAMUSCULAR | Status: AC

## 2024-03-06 MED ORDER — GLYCOPYRROLATE 0.2 MG/ML IJ SOLN
INTRAMUSCULAR | Status: DC | PRN
  Administered 2024-03-06 (×2): .1 mg via INTRAVENOUS

## 2024-03-06 MED ORDER — HEPARIN SODIUM (PORCINE) 1000 UNIT/ML IJ SOLN
INTRAMUSCULAR | Status: DC | PRN
  Administered 2024-03-06: 14:00:00 5000 [IU] via INTRAVENOUS

## 2024-03-06 MED ORDER — CHLORHEXIDINE GLUCONATE 4 % EX SOLN: 60.0000 mL | Freq: Once | CUTANEOUS | Status: DC

## 2024-03-06 MED ORDER — PROPOFOL 500 MG/50ML IV EMUL
INTRAVENOUS | Status: DC | PRN
  Administered 2024-03-06: 13:00:00 40 ug/kg/min via INTRAVENOUS

## 2024-03-06 MED ORDER — LIDOCAINE-EPINEPHRINE (PF) 1.5 %-1:200000 IJ SOLN
INTRAMUSCULAR | Status: DC | PRN
  Administered 2024-03-06: 12:00:00 20 mL via PERINEURAL

## 2024-03-06 MED ORDER — FENTANYL CITRATE (PF) 100 MCG/2ML IJ SOLN: 25.0000 ug | INTRAMUSCULAR | Status: DC | PRN

## 2024-03-06 MED ORDER — HYDROCODONE-ACETAMINOPHEN 5-325 MG PO TABS: 1.0000 | ORAL_TABLET | Freq: Four times a day (QID) | ORAL | 0 refills | Status: AC | PRN

## 2024-03-06 MED ORDER — FENTANYL CITRATE (PF) 100 MCG/2ML IJ SOLN
INTRAMUSCULAR | Status: AC
  Administered 2024-03-06: 12:00:00 50 ug via INTRAVENOUS
  Filled 2024-03-06: qty 2

## 2024-03-06 MED ORDER — ONDANSETRON HCL 4 MG/2ML IJ SOLN: 4.0000 mg | Freq: Once | INTRAMUSCULAR | Status: DC | PRN

## 2024-03-06 MED ORDER — 0.9 % SODIUM CHLORIDE (POUR BTL) OPTIME
TOPICAL | Status: DC | PRN
  Administered 2024-03-06: 14:00:00 1000 mL

## 2024-03-06 MED ORDER — CHLORHEXIDINE GLUCONATE 0.12 % MT SOLN
15.0000 mL | Freq: Once | OROMUCOSAL | Status: AC
  Administered 2024-03-06: 12:00:00 15 mL via OROMUCOSAL
  Filled 2024-03-06: qty 15

## 2024-03-06 MED ORDER — HEPARIN 6000 UNIT IRRIGATION SOLUTION
Status: AC
  Filled 2024-03-06: qty 500

## 2024-03-06 MED ORDER — HEPARIN 6000 UNIT IRRIGATION SOLUTION
Status: DC | PRN
  Administered 2024-03-06: 14:00:00 1

## 2024-03-06 MED ORDER — CEFAZOLIN SODIUM-DEXTROSE 2-4 GM/100ML-% IV SOLN
2.0000 g | INTRAVENOUS | Status: AC
  Administered 2024-03-06: 13:00:00 2 g via INTRAVENOUS
  Filled 2024-03-06: qty 100

## 2024-03-06 MED ORDER — LIDOCAINE 2% (20 MG/ML) 5 ML SYRINGE
INTRAMUSCULAR | Status: DC | PRN
  Administered 2024-03-06: 13:00:00 20 mg via INTRAVENOUS

## 2024-03-06 MED ORDER — SODIUM CHLORIDE 0.9 % IV SOLN: INTRAVENOUS | Status: DC

## 2024-03-06 MED ORDER — ONDANSETRON HCL 4 MG/2ML IJ SOLN
INTRAMUSCULAR | Status: DC | PRN
  Administered 2024-03-06: 13:00:00 4 mg via INTRAVENOUS

## 2024-03-06 SURGICAL SUPPLY — 32 items
ARMBAND PINK RESTRICT EXTREMIT (MISCELLANEOUS) ×1 IMPLANT
BAG COUNTER SPONGE SURGICOUNT (BAG) ×1 IMPLANT
CANISTER SUCTION 3000ML PPV (SUCTIONS) ×1 IMPLANT
CLIP TI MEDIUM 24 (CLIP) ×1 IMPLANT
CLIP TI WIDE RED SMALL 24 (CLIP) ×1 IMPLANT
COVER PROBE W GEL 5X96 (DRAPES) ×1 IMPLANT
DERMABOND ADVANCED .7 DNX12 (GAUZE/BANDAGES/DRESSINGS) ×1 IMPLANT
ELECTRODE REM PT RTRN 9FT ADLT (ELECTROSURGICAL) ×1 IMPLANT
GLOVE BIO SURGEON STRL SZ7.5 (GLOVE) ×1 IMPLANT
GLOVE BIOGEL PI IND STRL 8 (GLOVE) ×1 IMPLANT
GOWN STRL REUS W/ TWL LRG LVL3 (GOWN DISPOSABLE) ×2 IMPLANT
GOWN STRL REUS W/ TWL XL LVL3 (GOWN DISPOSABLE) ×2 IMPLANT
GRAFT GORETEX STRT 4-7X45 (Vascular Products) IMPLANT
KIT BASIN OR (CUSTOM PROCEDURE TRAY) ×1 IMPLANT
KIT TURNOVER KIT B (KITS) ×1 IMPLANT
LOOP VESSEL MINI RED (MISCELLANEOUS) IMPLANT
NDL HYPO 25GX1X1/2 BEV (NEEDLE) ×1 IMPLANT
NEEDLE HYPO 25GX1X1/2 BEV (NEEDLE) ×1 IMPLANT
PACK CV ACCESS (CUSTOM PROCEDURE TRAY) ×1 IMPLANT
PAD ARMBOARD POSITIONER FOAM (MISCELLANEOUS) ×2 IMPLANT
SLING ARM FOAM STRAP LRG (SOFTGOODS) IMPLANT
SOLN 0.9% NACL POUR BTL 1000ML (IV SOLUTION) ×1 IMPLANT
SOLN STERILE WATER BTL 1000 ML (IV SOLUTION) ×1 IMPLANT
SPIKE FLUID TRANSFER (MISCELLANEOUS) ×1 IMPLANT
SUT MNCRL AB 4-0 PS2 18 (SUTURE) ×1 IMPLANT
SUT PROLENE 6 0 BV (SUTURE) ×1 IMPLANT
SUT PROLENE 7 0 BV 1 (SUTURE) IMPLANT
SUT SILK 2 0 SH (SUTURE) IMPLANT
SUT VIC AB 2-0 CT1 TAPERPNT 27 (SUTURE) ×1 IMPLANT
SUT VIC AB 3-0 SH 27X BRD (SUTURE) ×2 IMPLANT
TOWEL GREEN STERILE (TOWEL DISPOSABLE) ×1 IMPLANT
UNDERPAD 30X36 HEAVY ABSORB (UNDERPADS AND DIAPERS) ×1 IMPLANT

## 2024-03-06 NOTE — Anesthesia Preprocedure Evaluation (Addendum)
 Anesthesia Evaluation  Patient identified by MRN, date of birth, ID band Patient awake    Reviewed: Allergy & Precautions, NPO status , Patient's Chart, lab work & pertinent test results, reviewed documented beta blocker date and time   Airway Mallampati: III  TM Distance: >3 FB Neck ROM: Full    Dental  (+) Edentulous Upper, Edentulous Lower   Pulmonary former smoker   Pulmonary exam normal breath sounds clear to auscultation       Cardiovascular hypertension (124/51 preop), Pt. on medications and Pt. on home beta blockers pulmonary hypertension (mod pHTN on echo 2023)Normal cardiovascular exam Rhythm:Regular Rate:Normal  Echo 2023  1. Left ventricular ejection fraction, by estimation, is 55 to 60%. The  left ventricle has normal function. The left ventricle has no regional  wall motion abnormalities. There is mild concentric left ventricular  hypertrophy. Left ventricular diastolic  parameters are consistent with Grade II diastolic dysfunction  (pseudonormalization).   2. Right ventricular systolic function is normal. The right ventricular  size is mildly enlarged. There is moderately elevated pulmonary artery  systolic pressure. The estimated right ventricular systolic pressure is  49.5 mmHg.   3. Left atrial size was severely dilated.   4. The mitral valve is abnormal. No evidence of mitral valve  regurgitation. No evidence of mitral stenosis.   5. The aortic valve is tricuspid. There is moderate calcification of the  aortic valve. Aortic valve regurgitation is not visualized. Aortic valve  sclerosis/calcification is present, without any evidence of aortic  stenosis.   6. The inferior vena cava is dilated in size with >50% respiratory  variability, suggesting right atrial pressure of 8 mmHg.     Neuro/Psych negative neurological ROS  negative psych ROS   GI/Hepatic negative GI ROS, Neg liver ROS,,,  Endo/Other   diabetes    Renal/GU ESRF and DialysisRenal diseaseLast HD 3d ago (due today), K 4.2  negative genitourinary   Musculoskeletal  (+) Arthritis , Osteoarthritis,    Abdominal   Peds  Hematology negative hematology ROS (+)   Anesthesia Other Findings   Reproductive/Obstetrics negative OB ROS                              Anesthesia Physical Anesthesia Plan  ASA: 3  Anesthesia Plan: MAC and Regional   Post-op Pain Management: Regional block* and Tylenol  PO (pre-op )*   Induction:   PONV Risk Score and Plan: 2 and Propofol  infusion and TIVA  Airway Management Planned: Natural Airway and Simple Face Mask  Additional Equipment: None  Intra-op Plan:   Post-operative Plan:   Informed Consent: I have reviewed the patients History and Physical, chart, labs and discussed the procedure including the risks, benefits and alternatives for the proposed anesthesia with the patient or authorized representative who has indicated his/her understanding and acceptance.       Plan Discussed with: CRNA  Anesthesia Plan Comments: (1st stage fistula under block/mac 11/2023, no issues)         Anesthesia Quick Evaluation

## 2024-03-06 NOTE — Anesthesia Procedure Notes (Signed)
 Anesthesia Regional Block: Supraclavicular block   Pre-Anesthetic Checklist: , timeout performed,  Correct Patient, Correct Site, Correct Laterality,  Correct Procedure, Correct Position, site marked,  Risks and benefits discussed,  Surgical consent,  Pre-op  evaluation,  At surgeon's request and post-op pain management  Laterality: Left  Prep: Maximum Sterile Barrier Precautions used, chloraprep       Needles:  Injection technique: Single-shot  Needle Type: Echogenic Stimulator Needle     Needle Length: 9cm  Needle Gauge: 22     Additional Needles:   Procedures:,,,, ultrasound used (permanent image in chart),,    Narrative:  Start time: 03/06/2024 12:05 PM End time: 03/06/2024 12:10 PM Injection made incrementally with aspirations every 5 mL.  Performed by: Personally  Anesthesiologist: Merla Almarie HERO, DO  Additional Notes: Monitors applied. No increased pain on injection. No increased resistance to injection. Injection made in 5cc increments. Good needle visualization. Patient tolerated procedure well.

## 2024-03-06 NOTE — Discharge Instructions (Signed)
 [facility]  Discharge Instructions  AV Fistula or Graft Surgery for Dialysis Access  Please refer to the following instructions for your post-procedure care. Your surgeon or physician assistant will discuss any changes with you.  Activity  You may drive the day following your surgery, if you are comfortable and no longer taking prescription pain medication. Resume full activity as the soreness in your incision resolves.  Bathing/Showering  You may shower after you go home. Keep your incision dry for 48 hours. Do not soak in a bathtub, hot tub, or swim until the incision heals completely. You may not shower if you have a hemodialysis catheter.  Incision Care  Clean your incision with mild soap and water  after 48 hours. Pat the area dry with a clean towel. You do not need a bandage unless otherwise instructed. Do not apply any ointments or creams to your incision. You may have skin glue on your incision. Do not peel it off. It will come off on its own in about one week. Your arm may swell a bit after surgery. To reduce swelling use pillows to elevate your arm so it is above your heart. Your doctor will tell you if you need to lightly wrap your arm with an ACE bandage.  Diet  Resume your normal diet. There are not special food restrictions following this procedure. In order to heal from your surgery, it is CRITICAL to get adequate nutrition. Your body requires vitamins, minerals, and protein. Vegetables are the best source of vitamins and minerals. Vegetables also provide the perfect balance of protein. Processed food has little nutritional value, so try to avoid this.  Medications  Resume taking all of your medications. If your incision is causing pain, you may take over-the counter pain relievers such as acetaminophen  (Tylenol ). If you were prescribed a stronger pain medication, please be aware these medications can cause nausea and constipation. Prevent  nausea by taking the medication with a snack or meal. Avoid constipation by drinking plenty of fluids and eating foods with high amount of fiber, such as fruits, vegetables, and grains.  Do not take Tylenol  if you are taking prescription pain medications.  Follow up Your surgeon may want to see you in the office following your access surgery. If so, this will be arranged at the time of your surgery.  Please call us  immediately for any of the following conditions:  Increased pain, redness, drainage (pus) from your incision site Fever of 101 degrees or higher Severe or worsening pain at your incision site Hand pain or numbness.  Reduce your risk of vascular disease:  Stop smoking. If you would like help, call [facility] at 1-800-QUIT-NOW (9795354279) or [place] at [phone] [phone]  Manage your cholesterol Maintain a desired weight Control your diabetes Keep your blood pressure down  Dialysis  It will take several weeks to several months for your new dialysis access to be ready for use. Your surgeon will determine when it is okay to use it. Your nephrologist will continue to direct your dialysis. You can continue to use your Permcath until your new access is ready for use.   03/06/2024 Devin [name] [id number] 1945-12-22  Surgeon(s): Gretta Lonni PARAS, MD  Procedures: LIGATION OF LEFT UPPER EXTREMITY ARTERIOVENOUS  FISTULA LEFT UPPER EXTREMITY INSERTION OF GORE-TEX 4-7MM STRETCH ARTERIOVENOUS GRAFT   May stick graft immediately   May stick graft on designated area only:   X Do not stick left AV graft for 4 weeks  If you have any questions, please call the office at [phone].

## 2024-03-06 NOTE — H&P (Signed)
 History and Physical Interval Note:  03/06/2024 11:56 AM  Perle Gibbon  has presented today for surgery, with the diagnosis of esrd.  The various methods of treatment have been discussed with the patient and family. After consideration of risks, benefits and other options for treatment, the patient has consented to  Procedures: TRANSPOSITION, VEIN, BASILIC (Left) as a surgical intervention.  The patient's history has been reviewed, patient examined, no change in status, stable for surgery.  I have reviewed the patient's chart and labs.  Questions were answered to the patient's satisfaction.    Plan left second stage basilic vein fistula versus AV graft placement.  I discussed his fistula has had a slow maturation process and if it looks small around the arterial anastomosis may placed AV graft.  Lonni JINNY Gaskins   Office Note        CC:  follow up Requesting Provider:  Toribio Jerel MATSU, MD   HPI: Nehemias Sauceda is a 78 y.o. ([birth year]) male who presents for evaluation of his left arm first stage basilic vein fistula.  This was created by Dr. Gaskins on 12/01/2023.  He was brought back for repeat ultrasound due to fistula being slow to mature.  He has been using his left arm to perform activities of daily living as instructed.  He continues to dialyze via [facility].  He denies any steal symptoms in his left hand.         Past Medical History:  Diagnosis Date   Arthritis     CKD (chronic kidney disease) stage 4, GFR 15-29 ml/min (HCC)     Diabetes mellitus without complication (HCC)     Gout     Hypertension                 Past Surgical History:  Procedure Laterality Date   AV FISTULA PLACEMENT Left 12/16/2021    Procedure: LEFT ARM ARTERIOVENOUS (AV) FISTULA CREATION;  Surgeon: Oris Krystal FALCON, MD;  Location: [facility];  Service: Vascular;  Laterality: Left;   AV FISTULA PLACEMENT Left 12/01/2023    Procedure: FIRST STAGE BRACHIOBASILLIC (AV) FISTULA CREATION;  Surgeon: Gaskins Lonni JINNY, MD;  Location: [facility];  Service: Vascular;  Laterality: Left;   BIOPSY   05/26/2022    Procedure: BIOPSY;  Surgeon: Eartha Angelia Toribio, MD;  Location: [facility];  Service: Gastroenterology;;   COLONOSCOPY WITH PROPOFOL  N/A 05/26/2022    Procedure: COLONOSCOPY WITH PROPOFOL ;  Surgeon: Eartha Angelia Toribio, MD;  Location: [facility];  Service: Gastroenterology;  Laterality: N/A;  200pm, asa 3   DIALYSIS/PERMA CATHETER INSERTION N/A 10/13/2023    Procedure: DIALYSIS/PERMA CATHETER INSERTION;  Surgeon: Gaskins Lonni JINNY, MD;  Location: [facility];  Service: Cardiovascular;  Laterality: N/A;   ESOPHAGOGASTRODUODENOSCOPY (EGD) WITH PROPOFOL  N/A 05/26/2022    Procedure: ESOPHAGOGASTRODUODENOSCOPY (EGD) WITH PROPOFOL ;  Surgeon: Eartha Angelia Toribio, MD;  Location: [facility];  Service: Gastroenterology;  Laterality: N/A;   HEMOSTASIS CLIP PLACEMENT   05/26/2022    Procedure: HEMOSTASIS CLIP PLACEMENT;  Surgeon: Eartha Angelia Toribio, MD;  Location: [facility];  Service: Gastroenterology;;   HYDROCELE EXCISION Right 04/15/2023    Procedure: HYDROCELECTOMY ADULT;  Surgeon: Sherrilee Belvie CROME, MD;  Location: [facility];  Service: Urology;  Laterality: Right;   KNEE SURGERY       POLYPECTOMY   05/26/2022    Procedure: POLYPECTOMY;  Surgeon: Eartha Angelia Toribio, MD;  Location: [facility];  Service: Gastroenterology;;   TONSILLECTOMY  Social History         Socioeconomic History   Marital status: Married      Spouse name: Not on file   Number of children: Not on file   Years of education: Not on file   Highest education level: Not on file  Occupational History   Not on file  Tobacco Use   Smoking status: Former      Current packs/day: 0.00      Types: Cigarettes      Start date: 59      Quit date: 37      Years since quitting: 32.9      Passive exposure: Past   Smokeless tobacco: Never  Vaping Use   Vaping status: Never Used  Substance and  Sexual Activity   Alcohol use: Never   Drug use: Never   Sexual activity: Yes  Other Topics Concern   Not on file  Social History Narrative   Not on file    Social Drivers of Health    Financial Resource Strain: Not on file  Food Insecurity: Not on file  Transportation Needs: Not on file  Physical Activity: Not on file  Stress: Not on file  Social Connections: Not on file  Intimate Partner Violence: Not on file           Family History  Problem Relation Age of Onset   Cancer Father     Stroke Sister     Kidney disease Sister     Heart disease Brother                  Current Outpatient Medications  Medication Sig Dispense Refill   aspirin  EC 81 MG tablet Take 81 mg by mouth daily. Swallow whole.       carvedilol  (COREG ) 12.5 MG tablet Take 12.5 mg by mouth 2 (two) times daily with a meal.       Cholecalciferol (VITAMIN D) 50 MCG (2000 UT) CAPS Take 2,000 Units by mouth daily.       cloNIDine  (CATAPRES ) 0.3 MG tablet Take 0.3 mg by mouth in the morning, at noon, and at bedtime.       Cyanocobalamin  (VITAMIN B-12) 5000 MCG TBDP Take 5,000 mcg by mouth once a week.       ferrous sulfate  325 (65 FE) MG tablet Take 1 tablet (325 mg total) by mouth 2 (two) times daily with a meal. (Patient taking differently: Take 325 mg by mouth daily with breakfast.) 180 tablet 3   furosemide  (LASIX ) 40 MG tablet Take 40 mg by mouth 2 (two) times daily.       hydrALAZINE  (APRESOLINE ) 100 MG tablet Take 100 mg by mouth in the morning, at noon, and at bedtime.       HYDROcodone -acetaminophen  (NORCO/VICODIN) 5-325 MG tablet Take 1 tablet by mouth every 6 (six) hours as needed for moderate pain (pain score 4-6). 10 tablet 0   NIFEdipine (PROCARDIA-XL/NIFEDICAL-XL) 30 MG 24 hr tablet Take 30 mg by mouth 2 (two) times daily. Empty stomach       simvastatin  (ZOCOR ) 20 MG tablet Take 20 mg by mouth in the morning.          No current facility-administered medications for this visit.         Allergies       Allergies  Allergen Reactions   Amlodipine         Other Reaction(s): Not available   Aspirin   REVIEW OF SYSTEMS:    [X]  denotes positive finding, [ ]  denotes negative finding Cardiac   Comments:  Chest pain or chest pressure:      Shortness of breath upon exertion:      Short of breath when lying flat:      Irregular heart rhythm:             Vascular      Pain in calf, thigh, or hip brought on by ambulation:      Pain in feet at night that wakes you up from your sleep:       Blood clot in your veins:      Leg swelling:              Pulmonary      Oxygen at home:      Productive cough:       Wheezing:              Neurologic      Sudden weakness in arms or legs:       Sudden numbness in arms or legs:       Sudden onset of difficulty speaking or slurred speech:      Temporary loss of vision in one eye:       Problems with dizziness:              Gastrointestinal      Blood in stool:       Vomited blood:              Genitourinary      Burning when urinating:       Blood in urine:             Psychiatric      Major depression:              Hematologic      Bleeding problems:      Problems with blood clotting too easily:             Skin      Rashes or ulcers:             Constitutional      Fever or chills:          PHYSICAL EXAMINATION:      Vitals:    02/15/24 1359  BP: (!) 149/69  Pulse: 67  Weight: 200 lb (90.7 kg)  Height: 5' 9 (1.753 m)      General:  WDWN in NAD; vital signs documented above Gait: Not observed HENT: WNL, normocephalic Pulmonary: normal non-labored breathing Cardiac: regular HR Abdomen: soft, NT, no masses Skin: without rashes Vascular Exam/Pulses: Palpable left radial pulse; easily palpable thrill near the anastomosis which becomes stiff more difficult to feel as the fistula runs deep in the mid and upper arm Extremities: without ischemic changes, without Gangrene , without cellulitis;  without open wounds;  Musculoskeletal: no muscle wasting or atrophy       Neurologic: A&O X 3 Psychiatric:  The pt has Normal affect.     Non-Invasive Vascular Imaging:   Over 800 mm/s flow volume Fistula is measuring 5-1/2 mm in the mid upper arm and 8 mm in the upper upper arm; still measuring around 3 mm near the anastomosis in the distal upper arm       ASSESSMENT/PLAN:: 78 y.o. male here for follow up for repeat duplex of the left arm first stage basilic vein fistula   Mr. Blazejewski returns for  repeat duplex of the left arm fistula due to the fistula being slow to mature.  On exam he has an easily palpable thrill near the arterial anastomosis which is improved from last office visit.  The diameter of the fistula has also slightly improved however still has a small diameter in the distal upper arm.  We discussed 2 options moving forward.  The first option would be to perform a fistulogram to see if there are any concerns with the fistula prior to proceeding with second stage basilic vein transposition.  The second option is to proceed directly to second stage basilic vein transposition given that the duplex and physical exam have improved over the last month.  This case will be discussed with Dr. Gretta.  The patient will then be called over the phone to schedule either the fistulogram or second stage basilic vein transposition.  Both procedures were discussed with him and his daughter Neville during today's office visit and they are both agreeable to proceed with either option.   ADDENDUM 11/26: This patient was discussed with Dr. Gretta and duplex was reviewed.  We will proceed with second stage basilic vein transposition.  No need for fistulogram prior to surgery.  Surgery will be scheduled over the phone by Alan Glance RN   Donnice Sender, PA-C [facility]  [phone]

## 2024-03-06 NOTE — Transfer of Care (Signed)
 Immediate Anesthesia Transfer of Care Note  Patient: Devin [name]  Procedure(s) Performed: LIGATION OF LEFT UPPER EXTREMITY ARTERIOVENOUS  FISTULA (Left: Arm Upper) LEFT UPPER EXTREMITY INSERTION OF GORE-TEX 4-7MM STRETCH ARTERIOVENOUS GRAFT (Left: Arm Upper)  Patient Location: PACU  Anesthesia Type:MAC and Regional  Level of Consciousness: awake, alert , and oriented  Airway & Oxygen Therapy: Patient Spontanous Breathing  Post-op Assessment: Report given to RN and Post -op Vital signs reviewed and stable  Post vital signs: Reviewed and stable  Last Vitals:  Vitals Value Taken Time  BP 130/62 03/06/24 14:20  Temp    Pulse 54 03/06/24 14:22  Resp 16 03/06/24 14:22  SpO2 100 % 03/06/24 14:22  Vitals shown include unfiled device data.  Last Pain:  Vitals:   03/06/24 1011  TempSrc:   PainSc: 0-No pain      Patients Stated Pain Goal: 0 (03/06/24 1011)  Complications: No notable events documented.

## 2024-03-06 NOTE — Op Note (Signed)
 OPERATIVE NOTE   DATE: March 06, 2024  PROCEDURE:   Ligation left basilic vein fistula Left upper arm arteriovenous graft (4 mm x 7 mm tapered Lazaro Cope)   PRE-OPERATIVE DIAGNOSIS: end stage renal disease   POST-OPERATIVE DIAGNOSIS: same  SURGEON: Lonni DOROTHA Gaskins, MD  ASSISTANT(S): Curry Damme, PA  ANESTHESIA: regional  ESTIMATED BLOOD LOSS: Minimal  FINDING(S): The basilic vein fistula was small only measuring about 2 mm near the arterial anastomosis.  I did not think this was suitable for transposition.  The basilic vein was ligated.  I placed a left upper arm AV graft with excellent thrill and palpable radial pulse.  SPECIMEN(S):  None  INDICATIONS:   Devin [name] is a 78 y.o. male who presents with ESRD and the need for permanent hemodialysis access.  Risk, benefits, and alternatives to access surgery were discussed.  The patient is aware the risks include but are not limited to: bleeding, infection, steal syndrome, nerve damage, ischemic monomelic neuropathy, failure to mature, and need for additional procedures.  The patient is aware of the risks and elects to proceed forward.  An assistant was needed given the complexity of the case and also for performing the arterial and venous anastomosis of a new AV graft.  DESCRIPTION: After full informed written consent was obtained from the patient, the patient was brought back to the operating room and placed supine upon the operating table.  The patient was given IV antibiotics prior to proceeding.  After obtaining adequate sedation, the patient was prepped and draped in standard fashion for a left arm access procedure.   I initially evaluated the basilic vein fistula with ultrasound and this was small as indicated on duplex.  I did not think it was suitable for transposition. I then elected to place a left upper arm AV graft.  I made a longitudinal incision over the brachial artery above the antecubitum and another  longitudinal incision over the brachial vein near the axillary vein.  I dissected down through the subcutaneous tissue and  fascia carefully and was able to dissect out the brachial artery.  The artery was about 4.0 mm externally.  It was controlled proximally and distally with vessel loops.  I then dissected out the larger brachial vein through the upper arm incision.  Externally, it appeared to be 5 mm in diameter.  I then dissected this vein proximally and distal.  II took a metal Gore tunneler and dissected from the antecubital incision to the axillary incision.  Then I delivered the 4 x 7-mm stretch Gore-Tex graft, through this metal tunneler and then pulled out the metal tunneler leaving the graft in place.  The 4-mm end was left on the brachial artery side and the 7-mm end toward the vein side.  I then gave the patient 5,000 units of heparin  to gain anticoagulation.  After waiting 2 minutes, I placed the brachial artery under tension proximally and distally with vessel loops, made an arteriotomy and extended it with a Potts scissor.  I sewed the 4-mm end of the graft to this arteriotomy with a running stitch of 6-0 Prolene with the help of my assistant.  At this point, then I completed the anastomosis in the usual fashion.  I released the vessel loops on the inflow and allowed the artery to decompress through the graft. There was good pulsatile bleeding through this graft.  I clamped the graft near its arterial anastomosis and sucked out all the blood in the graft and  loaded the graft with heparinized saline.  At this point, I pulled the graft to appropriate length and reset my exposure of the high brachial vein. The vein was controlled with Henle clamps and opened with 11 blade scalpel and extended with Potts scissors.  There was good venous backbleeding from the vein. I spatulated the graft to facilitate an end-to-side anastomosis.  In the process of spatulating, I cut the graft to appropriate length for  this anastomosis.  This graft was sewn to the vein in an end-to-side configuration with a 6-0 Prolene with the help of my assistant.  Prior to completing this anastomosis, I allowed the vein to back bleed and then I also allowed the artery to bleed in an antegrade fashion.  I completed this anastomosis in the usual fashion.  I irrigated out both incisions.  The graft had a good palpable thrill.  The radial pulse was palpable.  The subcutaneous tissue in each incision was reapproximated with a running stitch of 3-0 Vicryl.  The skin was then reapproximated with a running subcuticular 4-0 Monocryl.  The skin was then cleaned, dried, and Dermabond used to reinforce the skin closure.   COMPLICATIONS: None  CONDITION: Stable  Lonni DOROTHA Gaskins, MD [facility] Office: [phone]  Lonni JINNY Gaskins    03/06/2024, 2:18 PM

## 2024-03-06 NOTE — Anesthesia Postprocedure Evaluation (Signed)
 Anesthesia Post Note  Patient: Devin [name]  Procedure(s) Performed: LIGATION OF LEFT UPPER EXTREMITY ARTERIOVENOUS  FISTULA (Left: Arm Upper) LEFT UPPER EXTREMITY INSERTION OF GORE-TEX 4-7MM STRETCH ARTERIOVENOUS GRAFT (Left: Arm Upper)     Patient location during evaluation: PACU Anesthesia Type: Regional and MAC Level of consciousness: awake and alert, oriented and patient cooperative Pain management: pain level controlled Vital Signs Assessment: post-procedure vital signs reviewed and stable Respiratory status: spontaneous breathing, nonlabored ventilation and respiratory function stable Cardiovascular status: blood pressure returned to baseline and stable Postop Assessment: no apparent nausea or vomiting Anesthetic complications: no   No notable events documented.  Last Vitals:  Vitals:   03/06/24 1421 03/06/24 1430  BP: 130/62 133/61  Pulse: (!) 55 (!) 55  Resp: 14 10  Temp: (!) 36.4 C   SpO2: 100% 99%    Last Pain:  Vitals:   03/06/24 1430  TempSrc:   PainSc: 0-No pain                 Almarie [name]

## 2024-03-07 ENCOUNTER — Encounter (HOSPITAL_COMMUNITY): Payer: Self-pay | Admitting: Vascular Surgery

## 2024-03-21 ENCOUNTER — Ambulatory Visit: Attending: Vascular Surgery | Admitting: Physician Assistant

## 2024-03-21 VITALS — BP 177/70 | HR 76 | Temp 97.8°F | Resp 18 | Ht 69.0 in | Wt 200.0 lb

## 2024-03-21 DIAGNOSIS — N186 End stage renal disease: Secondary | ICD-10-CM

## 2024-03-21 NOTE — Progress Notes (Signed)
" ° ° °  Postoperative Access Visit   History of Present Illness   Devin [name] is a 78 y.o. year old male who presents for postoperative follow-up for: Ligation left basilic vein fistula and Left upper arm arteriovenous graft (4 mm x 7 mm tapered [facility]) 03/06/24 by Dr. Gretta. The patient's incisions are intact and healing well.  The patient notes no steal symptoms.  The patient is able to complete their activities of daily living.  He currently dialyzes on MWF via left internal jugular TDC   Physical Examination   Vitals:   03/21/24 1322  BP: (!) 177/70  Pulse: 76  Resp: 18  Temp: 97.8 F (36.6 C)  TempSrc: Temporal  Weight: 200 lb (90.7 kg)  Height: 5' 9 (1.753 m)   Body mass index is 29.53 kg/m.  left arm Incisions are intact and healing well, 2+ radial pulse, hand grip is 5/5, sensation in digits is intact, palpable thrill, bruit can  be auscultated     Medical Decision Making   Devin [name] is a 78 y.o. year old male who presents s/p Ligation left basilic vein fistula and Left upper arm arteriovenous graft (4 mm x 7 mm tapered Gore Tex) 03/06/24 by Dr. Gretta. Patent is without signs or symptoms of steal syndrome. His incisions are intact and healing well. Some mild upper arm swelling. The patient's access will be ready for use after 04/07/23 The patient's tunneled dialysis catheter can be removed when Nephrology is comfortable with the performance of the left AV graft The patient may follow up on a prn basis   Teretha Damme, PA-C [facility] [place] Office: [phone]  Clinic MD: Magda "

## 2024-04-20 ENCOUNTER — Other Ambulatory Visit (HOSPITAL_COMMUNITY): Payer: Self-pay | Admitting: Nephrology

## 2024-04-20 DIAGNOSIS — Z992 Dependence on renal dialysis: Secondary | ICD-10-CM

## 2024-04-27 ENCOUNTER — Ambulatory Visit (HOSPITAL_COMMUNITY)
Admission: RE | Admit: 2024-04-27 | Discharge: 2024-04-27 | Disposition: A | Source: Ambulatory Visit | Attending: Nephrology | Admitting: Nephrology

## 2024-04-27 DIAGNOSIS — Z4901 Encounter for fitting and adjustment of extracorporeal dialysis catheter: Secondary | ICD-10-CM | POA: Insufficient documentation

## 2024-04-27 DIAGNOSIS — Z992 Dependence on renal dialysis: Secondary | ICD-10-CM

## 2024-04-27 DIAGNOSIS — N186 End stage renal disease: Secondary | ICD-10-CM | POA: Insufficient documentation

## 2024-04-27 MED ORDER — LIDOCAINE-EPINEPHRINE 1 %-1:100000 IJ SOLN
INTRAMUSCULAR | Status: AC
  Filled 2024-04-27: qty 20

## 2024-04-27 MED ORDER — LIDOCAINE-EPINEPHRINE 1 %-1:100000 IJ SOLN: 20.0000 mL | Freq: Once | INTRAMUSCULAR | Status: DC

## 2024-04-27 NOTE — Procedures (Signed)
 PROCEDURE SUMMARY:  Successful removal of tunneled hemodialysis catheter.  Patient tolerated well.  EBL < 5 mL  See full dictation in Imaging for details.  Greig FORBES Jasmine PA-C 04/27/2024 3:30 PM
